# Patient Record
Sex: Male | Born: 1975 | Race: White | Hispanic: No | Marital: Single | Smoking: Former smoker
Health system: Southern US, Community
[De-identification: ages and names within clinical notes are randomized; demographics above are authoritative.]

## PROBLEM LIST (undated history)

## (undated) DIAGNOSIS — F32A Depression, unspecified: Secondary | ICD-10-CM

## (undated) DIAGNOSIS — F319 Bipolar disorder, unspecified: Secondary | ICD-10-CM

## (undated) DIAGNOSIS — F259 Schizoaffective disorder, unspecified: Secondary | ICD-10-CM

## (undated) HISTORY — PX: NO PAST SURGERIES: SHX2092

---

## 2019-11-04 ENCOUNTER — Other Ambulatory Visit: Payer: Self-pay

## 2019-11-04 ENCOUNTER — Ambulatory Visit (INDEPENDENT_AMBULATORY_CARE_PROVIDER_SITE_OTHER)
Admission: RE | Admit: 2019-11-04 | Discharge: 2019-11-04 | Disposition: A | Discharge status: Home / Self Care | Payer: Self-pay | Source: Ambulatory Visit

## 2019-11-04 DIAGNOSIS — L237 Allergic contact dermatitis due to plants, except food: Secondary | ICD-10-CM

## 2019-11-04 MED ORDER — PREDNISONE 10 MG (21) PO TBPK: ORAL_TABLET | Freq: Every day | ORAL | 0 refills | Status: DC

## 2019-11-04 NOTE — Discharge Instructions (Addendum)
Take steroid as prescribed: 6 tabs day 1, 5 tabs day 2, 4 tabs day 3, 3 tabs day 4, 2 tabs day 5, 1 tab day 6. May continue calamine lotion, aloe as needed for additional relief. Very important to wash all of your clothes, bedding, linens. Return for persistent or worsening swelling, or you develop change in vision, difficulty breathing or swallowing, chest pain, fever.

## 2019-11-04 NOTE — ED Provider Notes (Signed)
Virtual Visit via Video Note:  Sean Shepherd  initiated request for Telemedicine visit with [facility] Urgent Care team. I connected with Sean Shepherd  on 11/04/2019 at 2:36 PM  for a synchronized telemedicine visit using a video enabled HIPPA compliant telemedicine application. I verified that I am speaking with Sean Shepherd  using two identifiers. Sean Hall-Potvin, PA-C  was physically located in a [facility]gent care site and Sean Shepherd was located at a different location.   The limitations of evaluation and management by telemedicine as well as the availability of in-person appointments were discussed. Patient was informed that he  may incur a bill ( including co-pay) for this virtual visit encounter. Sean Shepherd  expressed understanding and gave verbal consent to proceed with virtual visit.     History of Present Illness:Sean Shepherd  is a 44 y.o. male presents with concern for wheezing reviewed dermatitis.  States that he was cutting vines from a tree Sunday afternoon.  Later realized it was poison ivy.  Had some itching over left leg, left arm, left side of face.  States that he has been using calamine lotion and aloe plant which provides a soothing relief, though no change with swelling.  Patient endorsing pruritus as well.  Seeking evaluation today given left periorbital edema.  States he is able to prop his eyes open: Denies visual changes, fever.  No difficulty breathing, swallowing, chest pain.    ROI as per HPI  History reviewed. No pertinent past medical history.  Not on File      Observations/Objective: 44 y.o male sitting in no acute distress.  Patient is able to speak in full sentences without coughing, sneezing, wheezing.  Patient does have significant left periorbital edema without overlying erythema.  Denies TTP.  Patient is able to open eyes, look around in all directions: No conjunctival injection.  Patient states vision is at  baseline.  Assessment and Plan: H&P consistent with poison ivy dermatitis.  Will start prednisone today patient follow-up with PCP in a few days for repeat evaluation.  Return precautions discussed, patient verbalized understanding and is agreeable to plan.  Follow Up Instructions: Patient to seek in-person evaluation for persistent, worsening symptoms including change in vision, difficulty breathing, swallowing, chest pain.   I discussed the assessment and treatment plan with the patient. The patient was provided an opportunity to ask questions and all were answered. The patient agreed with the plan and demonstrated an understanding of the instructions.   The patient was advised to call back or seek an in-person evaluation if the symptoms worsen or if the condition fails to improve as anticipated.  I provided 15 minutes of non-face-to-face time during this encounter.    Sean Hall-Potvin, PA-C  11/04/2019 2:36 PM         Shepherd, Sean, [place] 11/04/19 1437

## 2020-06-20 ENCOUNTER — Other Ambulatory Visit: Payer: Self-pay

## 2020-06-20 ENCOUNTER — Ambulatory Visit (INDEPENDENT_AMBULATORY_CARE_PROVIDER_SITE_OTHER): Payer: No Payment, Other | Admitting: Behavioral Health

## 2020-06-20 DIAGNOSIS — F2081 Schizophreniform disorder: Secondary | ICD-10-CM
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
## 2020-06-28 ENCOUNTER — Other Ambulatory Visit: Payer: Self-pay

## 2020-06-28 ENCOUNTER — Ambulatory Visit (HOSPITAL_COMMUNITY)
Admission: EM | Admit: 2020-06-28 | Discharge: 2020-06-29 | Disposition: A | Discharge status: Home / Self Care | Payer: No Payment, Other | Source: Ambulatory Visit | Attending: Psychiatry | Admitting: Psychiatry

## 2020-06-28 ENCOUNTER — Ambulatory Visit (INDEPENDENT_AMBULATORY_CARE_PROVIDER_SITE_OTHER): Payer: No Payment, Other | Admitting: Physician Assistant

## 2020-06-28 VITALS — BP 131/84 | HR 88 | Temp 98.0°F | Ht 72.0 in | Wt 148.0 lb

## 2020-06-28 DIAGNOSIS — F2081 Schizophreniform disorder: Secondary | ICD-10-CM

## 2020-06-28 DIAGNOSIS — Z20822 Contact with and (suspected) exposure to covid-19: Secondary | ICD-10-CM | POA: Diagnosis not present

## 2020-06-28 DIAGNOSIS — F259 Schizoaffective disorder, unspecified: Secondary | ICD-10-CM | POA: Insufficient documentation

## 2020-06-28 DIAGNOSIS — F250 Schizoaffective disorder, bipolar type: Secondary | ICD-10-CM

## 2020-06-28 DIAGNOSIS — F203 Undifferentiated schizophrenia: Secondary | ICD-10-CM | POA: Insufficient documentation

## 2020-06-28 LAB — COMPREHENSIVE METABOLIC PANEL
ALT: 14 U/L (ref 0–44)
AST: 16 U/L (ref 15–41)
Albumin: 4.7 g/dL (ref 3.5–5.0)
Alkaline Phosphatase: 64 U/L (ref 38–126)
Anion gap: 13 (ref 5–15)
BUN: 10 mg/dL (ref 6–20)
CO2: 28 mmol/L (ref 22–32)
Calcium: 10 mg/dL (ref 8.9–10.3)
Chloride: 100 mmol/L (ref 98–111)
Creatinine, Ser: 0.75 mg/dL (ref 0.61–1.24)
GFR, Estimated: >60 mL/min (ref >60)
Glucose, Bld: 74 mg/dL (ref 70–99)
Potassium: 4.3 mmol/L (ref 3.5–5.1)
Sodium: 141 mmol/L (ref 135–145)
Total Bilirubin: 3.2 mg/dL — ABNORMAL HIGH (ref 0.3–1.2)
Total Protein: 7.3 g/dL (ref 6.5–8.1)

## 2020-06-28 LAB — LIPID PANEL
Cholesterol: 197 mg/dL (ref 0–200)
HDL: 59 mg/dL (ref >40)
LDL Cholesterol: 128 mg/dL — ABNORMAL HIGH (ref 0–99)
Total CHOL/HDL Ratio: 3.3 RATIO
Triglycerides: 49 mg/dL (ref <150)
VLDL: 10 mg/dL (ref 0–40)

## 2020-06-28 LAB — CBC WITH DIFFERENTIAL/PLATELET
Abs Immature Granulocytes: 0.02 10*3/uL (ref 0.00–0.07)
Basophils Absolute: 0 10*3/uL (ref 0.0–0.1)
Basophils Relative: 0 %
Eosinophils Absolute: 0 10*3/uL (ref 0.0–0.5)
Eosinophils Relative: 0 %
HCT: 47.9 % (ref 39.0–52.0)
Hemoglobin: 15.7 g/dL (ref 13.0–17.0)
Immature Granulocytes: 0 %
Lymphocytes Relative: 16 %
Lymphs Abs: 1.5 10*3/uL (ref 0.7–4.0)
MCH: 29.8 pg (ref 26.0–34.0)
MCHC: 32.8 g/dL (ref 30.0–36.0)
MCV: 91.1 fL (ref 80.0–100.0)
Monocytes Absolute: 0.6 10*3/uL (ref 0.1–1.0)
Monocytes Relative: 6 %
Neutro Abs: 7 10*3/uL (ref 1.7–7.7)
Neutrophils Relative %: 78 %
Platelets: 258 10*3/uL (ref 150–400)
RBC: 5.26 MIL/uL (ref 4.22–5.81)
RDW: 11.9 % (ref 11.5–15.5)
WBC: 9.1 10*3/uL (ref 4.0–10.5)
nRBC: 0 % (ref 0.0–0.2)

## 2020-06-28 LAB — POC SARS CORONAVIRUS 2 AG: SARS Coronavirus 2 Ag: NEGATIVE

## 2020-06-28 LAB — POCT URINE DRUG SCREEN - MANUAL ENTRY (I-SCREEN)
POC Amphetamine UR: NOT DETECTED
POC Buprenorphine (BUP): NOT DETECTED
POC Cocaine UR: NOT DETECTED
POC Marijuana UR: POSITIVE — AB
POC Methadone UR: NOT DETECTED
POC Methamphetamine UR: NOT DETECTED
POC Morphine: NOT DETECTED
POC Oxazepam (BZO): NOT DETECTED
POC Oxycodone UR: NOT DETECTED
POC Secobarbital (BAR): NOT DETECTED

## 2020-06-28 LAB — TSH: TSH: 1.452 u[IU]/mL (ref 0.350–4.500)

## 2020-06-28 MED ORDER — ALUM & MAG HYDROXIDE-SIMETH 200-200-20 MG/5ML PO SUSP: 30.0000 mL | ORAL | Status: DC | PRN

## 2020-06-28 MED ORDER — ACETAMINOPHEN 325 MG PO TABS: 650.0000 mg | ORAL_TABLET | Freq: Four times a day (QID) | ORAL | Status: DC | PRN

## 2020-06-28 MED ORDER — RISPERIDONE 2 MG PO TBDP
2.0000 mg | ORAL_TABLET | Freq: Every day | ORAL | Status: DC
  Administered 2020-06-28: 2 mg via ORAL
  Filled 2020-06-28: qty 1
  Filled 2020-06-28: qty 7

## 2020-06-28 MED ORDER — TRAZODONE HCL 50 MG PO TABS: 50.0000 mg | ORAL_TABLET | Freq: Every evening | ORAL | Status: DC | PRN

## 2020-06-28 MED ORDER — MAGNESIUM HYDROXIDE 400 MG/5ML PO SUSP: 30.0000 mL | Freq: Every day | ORAL | Status: DC | PRN
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
## 2020-06-28 NOTE — ED Notes (Signed)
introduced self to pt. Pt is calm and cooperative w/o any c/o of pain or distress. Will continue to monitor pt for safety
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
## 2020-06-28 NOTE — ED Notes (Signed)
Pt sleeping@this time. Breathing even and unlabored. Will continue to monitor for safety 

## 2020-06-28 NOTE — ED Notes (Signed)
Patient belongings in locker #1

## 2020-06-28 NOTE — ED Notes (Signed)
44 yo male admitted to continuous assessment due to passive SI, at current denies SI/HI. Pt reports hallucinations telling him to hurt himself but pt states, "I wouldn't do it though". Calm, cooperative but talkative throughout assessment. Will monitor for safety.

## 2020-06-29 LAB — RESPIRATORY PANEL BY RT PCR (FLU A&B, COVID)
Influenza A by PCR: NEGATIVE
Influenza B by PCR: NEGATIVE
SARS Coronavirus 2 by RT PCR: NEGATIVE

## 2020-06-29 MED ORDER — RISPERIDONE 2 MG PO TBDP: 2.0000 mg | ORAL_TABLET | Freq: Every day | ORAL | 0 refills | Status: DC

## 2020-06-29 NOTE — Discharge Instructions (Signed)
[facility] during walk in hours for appointment with psychiatrist for further medication management. Please come for follow up appointment before your 7 day sample runs out so that you will not be without medication.  Walk in hours are 8-11 AM Monday through Thursday. There is often a wait, and it is best to arrive by 7:30 AM.   Address:  [address], in [place], [address] Ph: [phone]   

## 2020-06-29 NOTE — ED Notes (Signed)
Pt alert and oriented on the unit. Education, support, and encouragement provided. Discharge summary, medications and follow up appointments reviewed with pt. Suicide prevention resources provided. Pt's belongings in locker # 1 returned and belongings sheet signed. Pt denies SI/HI, pain, or any concerns at this time. Pt ambulatory on and off unit. Pt discharged to lobby.

## 2020-06-29 NOTE — ED Notes (Signed)
Meal given

## 2020-06-29 NOTE — ED Provider Notes (Signed)
FBC/OBS ASAP Discharge Summary  Date and Time: 06/29/2020 9:51 AM  Name: Sean Shepherd  MRN:  [id number]   Discharge Diagnoses:  Final diagnoses:  Schizoaffective disorder, bipolar type (HCC)    Subjective:  Patient interviewed in the observation area this morning. He is sitting in bed with mask on and is calm, cooperative and pleasant. He states that he has not noticed much difference with the medication; however, objectively TP is more organized, no delusional TC is volunteered or elicited, and speech is less rapid. He states that he slept "alright" although woke up 3-4 x throughout the night. Pt denies any AVH at time of interview but states that he did have them this AM but that "I am used to it". Denies SI/HI- "the thoughts of hurting are gone". Pt has coherent and linear conversation regarding the fact that he is aware he has delusions (no delusional TC reported today) and that this is somewhat upsetting since they were "delusions of grandeur". He denies SE/AE of medications and states that he would be amenable to taking them after leaving the hospital.    Andrey Campanile (mother) [phone] 9:57 AM called and notified mother of patients progress. She will pick him up around noon  Stay Summary: Pt presented to outpatient appointment but at his appointment there was concern for psychosis and it was recommend he present downstairs for criss evaluation. On initial interview, patient anxious bizarre, delusional although did display good insight into his delusion recognizing that they are not true. He also reported AVH, although did not appear to be RIS. His TP was tangential/circumstantial with rapid/pressured speech although redirectable. Pt was amenable to spend the night in observation in order to trial medication. Patient was started on risperdal 2 mg qhs and tolerated it well. Pt reported no subjective improvement in sx; however, objectively, TP was more organized and less tangential/cicumstantial.  On day of discharge he denied SI/HI and current AVH. He was amenable to continue medication outpatient.   Total Time spent with patient: 30 minutes  Past Psychiatric History: see H&P Past Medical History: No past medical history on file. No past surgical history on file. Family History: No family history on file. Family Psychiatric History: see H&P Social History:  Social History   Substance and Sexual Activity  Alcohol Use None     Social History   Substance and Sexual Activity  Drug Use Not on file    Social History   Socioeconomic History  . Marital status: Single    Spouse name: Not on file  . Number of children: Not on file  . Years of education: Not on file  . Highest education level: Not on file  Occupational History  . Not on file  Tobacco Use  . Smoking status: Not on file  Substance and Sexual Activity  . Alcohol use: Not on file  . Drug use: Not on file  . Sexual activity: Not on file  Other Topics Concern  . Not on file  Social History Narrative  . Not on file   Social Determinants of Health   Financial Resource Strain:   . Difficulty of Paying Living Expenses: Not on file  Food Insecurity:   . Worried About Programme researcher, broadcasting/film/video in the Last Year: Not on file  . Ran Out of Food in the Last Year: Not on file  Transportation Needs:   . Lack of Transportation (Medical): Not on file  . Lack of Transportation (Non-Medical): Not on file  Physical Activity:   .  Days of Exercise per Week: Not on file  . Minutes of Exercise per Session: Not on file  Stress:   . Feeling of Stress : Not on file  Social Connections:   . Frequency of Communication with Friends and Family: Not on file  . Frequency of Social Gatherings with Friends and Family: Not on file  . Attends Religious Services: Not on file  . Active Member of Clubs or Organizations: Not on file  . Attends Banker Meetings: Not on file  . Marital Status: Not on file   SDOH:  SDOH  Screenings   Alcohol Screen:   . Last Alcohol Screening Score (AUDIT): Not on file  Depression (PHQ2-9): Low Risk   . PHQ-2 Score: 1  Financial Resource Strain:   . Difficulty of Paying Living Expenses: Not on file  Food Insecurity:   . Worried About Programme researcher, broadcasting/film/video in the Last Year: Not on file  . Ran Out of Food in the Last Year: Not on file  Housing:   . Last Housing Risk Score: Not on file  Physical Activity:   . Days of Exercise per Week: Not on file  . Minutes of Exercise per Session: Not on file  Social Connections:   . Frequency of Communication with Friends and Family: Not on file  . Frequency of Social Gatherings with Friends and Family: Not on file  . Attends Religious Services: Not on file  . Active Member of Clubs or Organizations: Not on file  . Attends Banker Meetings: Not on file  . Marital Status: Not on file  Stress:   . Feeling of Stress : Not on file  Tobacco Use:   . Smoking Tobacco Use: Not on file  . Smokeless Tobacco Use: Not on file  Transportation Needs:   . Lack of Transportation (Medical): Not on file  . Lack of Transportation (Non-Medical): Not on file    Has this patient used any form of tobacco in the last 30 days? (Cigarettes, Smokeless Tobacco, Cigars, and/or Pipes) Prescription not provided because: patient does not use tobacco  Current Medications:  Current Facility-Administered Medications  Medication Dose Route Frequency Provider Last Rate Last Admin  . acetaminophen (TYLENOL) tablet 650 mg  650 mg Oral Q6H PRN Estella Husk, MD      . alum & mag hydroxide-simeth (MAALOX/MYLANTA) 200-200-20 MG/5ML suspension 30 mL  30 mL Oral Q4H PRN Estella Husk, MD      . magnesium hydroxide (MILK OF MAGNESIA) suspension 30 mL  30 mL Oral Daily PRN Estella Husk, MD      . risperiDONE (RISPERDAL M-TABS) disintegrating tablet 2 mg  2 mg Oral QHS Estella Husk, MD   2 mg at 06/28/20 2105  . traZODone  (DESYREL) tablet 50 mg  50 mg Oral QHS PRN Estella Husk, MD       No current outpatient medications on file.    PTA Medications: (Not in a hospital admission)   Musculoskeletal  Strength & Muscle Tone: within normal limits Gait & Station: normal Patient leans: N/A  Psychiatric Specialty Exam  Presentation  General Appearance: Appropriate for Environment;Casual  Eye Contact:Good  Speech:Clear and Coherent;Normal Rate  Speech Volume:Normal  Handedness:Right   Mood and Affect  Mood:Euthymic  Affect:Appropriate;Constricted   Thought Process  Thought Processes:Linear (less circumstantial/tangential than yesterday)  Descriptions of Associations:Intact  Orientation:Full (Time, Place and Person)  Thought Content:WDL;Other (comment) (No delusional TC elicited or volunteered on exam)  Hallucinations:Hallucinations: None (denies current AVH) Description of Auditory Hallucinations: denied Description of Visual Hallucinations: denied  Ideas of Reference:None  Suicidal Thoughts:Suicidal Thoughts: No  Homicidal Thoughts:Homicidal Thoughts: No   Sensorium  Memory:Immediate Good;Recent Good;Remote Good  Judgment:Fair  Insight:Good   Executive Functions  Concentration:Fair  Attention Span:Good  Recall:Good  Fund of Knowledge:Good  Language:Good   Psychomotor Activity  Psychomotor Activity:Psychomotor Activity: Normal   Assets  Assets:Communication Skills;Desire for Improvement;Physical Health;Resilience;Social Support   Sleep  Sleep:Sleep: Fair   Physical Exam  Physical Exam Constitutional:      Appearance: Normal appearance. He is normal weight.  HENT:     Head: Normocephalic.  Eyes:     Extraocular Movements: Extraocular movements intact.  Pulmonary:     Effort: Pulmonary effort is normal.  Musculoskeletal:     Cervical back: Normal range of motion.  Neurological:     Mental Status: He is alert.    Review of Systems   Constitutional: Negative for chills and fever.  HENT: Negative for congestion and ear pain.   Gastrointestinal: Negative for abdominal pain, nausea and vomiting.  Musculoskeletal: Negative for myalgias.  Neurological: Negative for headaches.   Blood pressure 126/72, pulse 80, temperature 97.7 F (36.5 C), temperature source Oral, resp. rate 18, height 6\' 1"  (1.854 m), weight 67.1 kg, SpO2 99 %. Body mass index is 19.53 kg/m.  Demographic Factors:  Male, Caucasian and Living alone  Loss Factors: Decrease in vocational status (has not worked since 2016)  Historical Factors: Family history of suicide, Family history of mental illness or substance abuse and Impulsivity  Risk Reduction Factors:   Sense of responsibility to family, Positive social support and Positive coping skills or problem solving skills  Continued Clinical Symptoms:  Patient denied AVH at time of discharge   Cognitive Features That Contribute To Risk:  None    Suicide Risk:  Minimal: No identifiable suicidal ideation.  Patients presenting with no risk factors but with morbid ruminations; may be classified as minimal risk based on the severity of the depressive symptoms  Plan Of Care/Follow-up recommendations:  Activity:  as tolerated Diet:  regular Other:     Patient is instructed prior to discharge to: Take all medications as prescribed by his/her mental healthcare provider. Report any adverse effects and or reactions from the medicines to his/her outpatient provider promptly. Patient has been instructed & cautioned: To not engage in alcohol and or illegal drug use while on prescription medicines. In the event of worsening symptoms, patient is instructed to call the crisis hotline, 911 and or go to the nearest ED for appropriate evaluation and treatment of symptoms. To follow-up with his/her primary care provider for your other medical issues, concerns and or health care needs.     Disposition:discharge  into care of parents  2017, MD 06/29/2020, 9:51 AM

## 2020-06-29 NOTE — ED Notes (Signed)
Pt sleeping@this  time. breathing and unlabored. Will continue to monitor for safety

## 2020-06-29 NOTE — ED Notes (Signed)
Pt alert, oriented and sitting up in his chair/bed. Pt is ambulatory on unit with no concerns at this time. Education, support, and encouragement provided. Pt verbally contracts for safety and remains safe on the unit.

## 2020-06-30 LAB — HEMOGLOBIN A1C
Hgb A1c MFr Bld: 5.7 % — ABNORMAL HIGH (ref 4.8–5.6)
Mean Plasma Glucose: 117 mg/dL

## 2020-07-05 ENCOUNTER — Ambulatory Visit (INDEPENDENT_AMBULATORY_CARE_PROVIDER_SITE_OTHER): Payer: No Payment, Other | Admitting: Physician Assistant

## 2020-07-05 ENCOUNTER — Telehealth (HOSPITAL_COMMUNITY): Payer: Self-pay | Admitting: *Deleted

## 2020-07-05 ENCOUNTER — Encounter (HOSPITAL_COMMUNITY): Payer: Self-pay | Admitting: Physician Assistant

## 2020-07-05 ENCOUNTER — Other Ambulatory Visit: Payer: Self-pay

## 2020-07-05 ENCOUNTER — Other Ambulatory Visit (HOSPITAL_COMMUNITY): Payer: Self-pay | Admitting: Physician Assistant

## 2020-07-05 VITALS — BP 117/73 | HR 70 | Ht 73.0 in

## 2020-07-05 DIAGNOSIS — F250 Schizoaffective disorder, bipolar type: Secondary | ICD-10-CM

## 2020-07-05 MED ORDER — RISPERIDONE 2 MG PO TABS: 2.0000 mg | ORAL_TABLET | Freq: Every day | ORAL | 1 refills | Status: DC

## 2020-07-05 MED FILL — risperiDONE 2 MG TABS: 2 | 30 days supply | Qty: 30 | Fill #0

## 2020-07-05 NOTE — Progress Notes (Signed)
Psychiatric Initial Adult Assessment   Patient Identification: Sean Shepherd MRN:  [id number] Date of Evaluation:  07/05/2020 Referral Source: N/A Chief Complaint:   Chief Complaint    Medication Management     Visit Diagnosis:    ICD-10-CM   1. Schizoaffective disorder, bipolar type (HCC)  F25.0 risperiDONE (RISPERDAL) 2 MG tablet    History of Present Illness:  Sean Shepherd is a 44 year old, male with a past psychiatric history significant for auditory hallucinations who presents to [facility] for medication management. Patient was last assessed at [facility] for worsening auditory hallucinations on October 27th, 2021. During this encounter, patient was not able to contract for safety and was deemed a potential danger to himself. As a result, patient was admitted to [facility] for stabilization and management for his worsening auditory hallucinations.  Today patient states he feels more calm since the last encounter. Patient states that he was admitted to [facility] on October 27th 2021 (Wednesday) and discharged the following day. During his admission, patient was diagnosed with Schizoaffective disorder bipolar type and discharged with a 7-day prescription of Risperdal. Patient is interested in a new prescription of Risperdal and reports improvement in symptoms while taking the medication during his 24 hr stay in [facility].  Before taking Risperdal, patient states that he had issues with sleep and would stay up for hours on end. When patient was finally able to go to bed, he would often sleep on his couch only to end of up waking hours later. He endorses that his sleep has improved while being on Risperdal and he has even managed to sleep in his bed. Patient states that he feels more level now, however, his auditory hallucinatiosn are still present. Patient states that he still gets anxious at times especially when thinking about  managing this new diagnosis. Patient's main concern today is getting a refill on his Risperdal and continuing his future follow up appointments.  Patient denies active suicide ideation, however, patient reports that he will occasionally have festering thoughts, "here and there," of harming himself. He states that whenever these thoughts occur, he engages in grounding techniques that involve applying minimal amounts of pain and pressure at specific points on the body that keep him in the present. Patient denies homicide ideation. Patient reports auditory hallucinations but states they have been more manageable since being placed on Risperdal. Patient states he receives 5 - 6 hours of sleep and feels mostly well rested upon waking. Patient reports appetite and often has 6 smaller meals a day. 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 Patient admits to using marijuana in the past for self-medications with his most recent use occurring 18 hours ago. Patient has recently started using hemp products for management of symptoms.  Associated Signs/Symptoms: Depression Symptoms:  psychomotor agitation, anxiety, (Hypo) Manic Symptoms:  Distractibility, Flight of Ideas, Grandiosity, Hallucinations, Anxiety Symptoms:  Excessive Worry, Panic Symptoms, Psychotic Symptoms:  Hallucinations: Auditory PTSD Symptoms: Re-experiencing:  Flashbacks Intrusive Thoughts  Past Psychiatric History: Schizoaffective disorder/bipolar type  Previous Psychotropic Medications: No   Substance Abuse History in the last 12 months:  Yes.    Consequences of Substance Abuse: Negative  Past Medical History: No past medical history on file. No past surgical history on file.  Family Psychiatric History: Father - Depression, currently taking Zoloft Sister - unsure, hx of trouble with the law, unsure of med taken  Family History:  No family history on  file.  Social History:   Social History   Socioeconomic History  . Marital status: Single    Spouse name: Not on file  . Number of children: Not on file  . Years of education: Not on file  . Highest education level: Not on file  Occupational History  . Not on file  Tobacco Use  . Smoking status: Not on file  Substance and Sexual Activity  . Alcohol use: Not on file  . Drug use: Not on file  . Sexual activity: Not on file  Other Topics Concern  . Not on file  Social History Narrative  . Not on file   Social Determinants of Health   Financial Resource Strain:   . Difficulty of Paying Living Expenses: Not on file  Food Insecurity:   . Worried About Programme researcher, broadcasting/film/video in the Last Year: Not on file  . Ran Out of Food in the Last Year: Not on file  Transportation Needs:   . Lack of Transportation (Medical): Not on file  . Lack of Transportation (Non-Medical): Not on file  Physical Activity:   . Days of Exercise per Week: Not on file  . Minutes of Exercise per Session: Not on file  Stress:   . Feeling of Stress : Not on file  Social Connections:   . Frequency of Communication with Friends and Family: Not on file  . Frequency of Social Gatherings with Friends and Family: Not on file  . Attends Religious Services: Not on file  . Active Member of Clubs or Organizations: Not on file  . Attends Banker Meetings: Not on file  . Marital Status: Not on file    Additional Social History: Patient receives therapy from [facility] and attends [employer].  Patient is eventually looking to seek employment.  Allergies:  No Known Allergies  Metabolic Disorder Labs: Lab Results  Component Value Date   HGBA1C 5.7 (H) 06/28/2020   MPG 117 06/28/2020   No results found for: PROLACTIN Lab Results  Component Value Date   CHOL 197 06/28/2020   TRIG 49 06/28/2020   HDL 59 06/28/2020   CHOLHDL 3.3 06/28/2020   VLDL 10 06/28/2020   LDLCALC 128 (H)  06/28/2020   Lab Results  Component Value Date   TSH 1.452 06/28/2020    Therapeutic Level Labs: No results found for: LITHIUM No results found for: CBMZ No results found for: VALPROATE  Current Medications: Current Outpatient Medications  Medication Sig Dispense Refill  . risperiDONE (RISPERDAL) 2 MG tablet Take 1 tablet (2 mg total) by mouth at bedtime. 30 tablet 1   No current facility-administered medications for this visit.    Musculoskeletal: Strength & Muscle Tone: within normal limits Gait & Station: normal Patient leans: N/A  Psychiatric Specialty Exam: Review of Systems  Psychiatric/Behavioral: Positive for agitation and hallucinations. Negative for self-injury, sleep disturbance and suicidal ideas. The patient is nervous/anxious and is hyperactive.     Blood pressure 117/73, pulse 70, height 6\' 1"  (1.854 m), SpO2 100 %.Body mass index is 19.53 kg/m.  General Appearance: Well Groomed  Eye Contact:  Good  Speech:  Clear and Coherent and Normal Rate  Volume:  Normal  Mood:  Anxious and Pleasant  Affect:  Appropriate  Thought Process:  Coherent and Goal Directed  Orientation:  Full (Time, Place, and Person)  Thought Content:  Logical and Circumstantial  Suicidal Thoughts:  No  Homicidal Thoughts:  No  Memory:  Immediate;   Good Recent;   Good Remote;   Good  Judgement:  Good  Insight:  Good  Psychomotor Activity:  Normal and Restlessness  Concentration:  Concentration: Good and Attention Span: Good  Recall:  Good  Fund of Knowledge:Good  Language: Good  Akathisia:  No  Handed:  Right  AIMS (if indicated):  not done  Assets:  Communication Skills Desire for Improvement Housing Physical Health Resilience Social Support  ADL's:  Intact  Cognition: WNL  Sleep:  Good   Screenings: PHQ2-9     ED from 06/28/2020 in [facility]  PHQ-2 Total Score 1      Assessment and Plan:  Sean Shepherd is a 44 year old,  male with a past psychiatric history significant for auditory hallucinations who presents to [facility] for medication management. Patient was last assessed at [facility] for worsening auditory hallucinations on October 27th, 2021. During the encounter, patient was not able to contract for safety and was deemed a potential danger to himself. As a result, patient was admitted to [facility] for stabilization and management for his worsening auditory hallucinations. Today, patient states that he feels more calm than the previous encounter. Although patient reports that he is doing better, he endorses that he still experiences auditory hallucinations. Patient reports improvement of symptoms while on Risperdal and is requesting a refill on this medication. Patient has no other concerns at this time.  1. Schizoaffective disorder, bipolar type (HCC)   - risperiDONE (RISPERDAL) 2 MG tablet; Take 1 tablet (2 mg total) by mouth at bedtime.  Dispense: 30 tablet; Refill: 1  Patient to follow up in 4 weeks.  Meta Hatchet, PA 11/3/202110:25 PM

## 2020-07-05 NOTE — Telephone Encounter (Signed)
Call from [facility] asking if patients rx for Risperidone disinegrating tabs can be changed to oral tabs as the M-Tabs are too expensive and they will not order them. Contacted [facility] Dr Bronwen Betters and she was delayed getting back to me on this concern and when I called pharm back with the answer that yes it could be changed to oral tabs he had left the pharm with his Rx because he said he has an appt here today and will ask the provider about it when he is seen.

## 2020-07-16 ENCOUNTER — Encounter (HOSPITAL_COMMUNITY): Payer: Self-pay | Admitting: Physician Assistant

## 2020-08-02 MED FILL — risperiDONE 2 MG TABS: 2 | 30 days supply | Qty: 30 | Fill #1

## 2020-08-08 ENCOUNTER — Ambulatory Visit (INDEPENDENT_AMBULATORY_CARE_PROVIDER_SITE_OTHER): Payer: No Payment, Other | Admitting: Physician Assistant

## 2020-08-08 ENCOUNTER — Encounter (HOSPITAL_COMMUNITY): Payer: Self-pay | Admitting: Physician Assistant

## 2020-08-08 ENCOUNTER — Other Ambulatory Visit (HOSPITAL_COMMUNITY): Payer: Self-pay | Admitting: Physician Assistant

## 2020-08-08 ENCOUNTER — Other Ambulatory Visit: Payer: Self-pay

## 2020-08-08 VITALS — BP 122/68 | HR 58 | Ht 73.0 in | Wt 164.0 lb

## 2020-08-08 DIAGNOSIS — F250 Schizoaffective disorder, bipolar type: Secondary | ICD-10-CM | POA: Diagnosis not present

## 2020-08-08 MED ORDER — RISPERIDONE 2 MG PO TABS: 2.0000 mg | ORAL_TABLET | Freq: Every day | ORAL | 1 refills | Status: DC

## 2020-08-08 MED ORDER — RISPERIDONE 1 MG PO TABS: 1.0000 mg | ORAL_TABLET | Freq: Every day | ORAL | 1 refills | Status: DC

## 2020-08-08 NOTE — Progress Notes (Signed)
BH MD/PA/NP OP Progress Note  08/08/2020 2:26 PM Sean Shepherd  MRN:  [id number]  Chief Complaint:  Chief Complaint    Follow-up; Medication Management     HPI:  Sean Shepherd is a 44 year old, male with a past psychiatric history significant for auditory hallucinations who presents to [facility] for follow-up and medication management.  Patient is currently being managed on the following medication:  Risperdal 2 mg by mouth at bedtime  Patient reports that he is still "battling voices" from the time he wakes up to the time he goes to sleep.  Although patient endorses auditory hallucinations, he reports feeling more stable and having more consistent sleep.  Patient expresses that he still is affected by sounds that are translated into auditory hallucinations.  Patient states that his hallucinations are vivid and constantly occurring. He states that his mornings are usually hellacious because his hallucinations are most overwhelming in the morning.  Patient does express that he sometimes forgets to take his medications.  Patient also expresses experiencing depressive symptoms.  Patient states that his symptoms include feeling hopeless, lack of motivation, and no source of pleasure.  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  Patient also attributes feeling more depressed due to changes in the season.  Patient states that in the past he has used cannabis to help alleviate his depression, however, he does note an incident where cannabis sent him into mania.  Patient has been placed on Lexapro in the past for the management of depression, however, the Lexapro made him feel flat.  Patient states that the only thing that has been able to help with his depressive symptoms is CBD oil from the a hemp store.  Patient also believes that CBD oil may be  contributing to his improved sleep.  Patient is pleasant, calm, cooperative, and fully engaged during the entirety of the encounter.  Patient reports depressed mood and feelings of anxiety.  He attributes these feelings to not knowing where to go, having no direction, and recently filing for unemployment.  Patient expresses feeling ashamed about having to file for unemployment.  Patient denies current suicidal or homicidal ideations.  He states he has no plan to harm himself and states that he would not want to create problems for his family by committing suicide.  Patient endorses constant auditory and visual hallucinations.  Patient states that he receives on average 10 hours of sleep and feels well rested upon waking.  Patient has fair appetite and states he eats only when he is hungry.  Patient denies alcohol or tobacco use.  Patient states that he does use CBD oil and delta8 THC.   Visit Diagnosis:    ICD-10-CM   1. Schizoaffective disorder, bipolar type (HCC)  F25.0 risperiDONE (RISPERDAL) 1 MG tablet    risperiDONE (RISPERDAL) 2 MG tablet    Past Psychiatric History: Schizoaffective disorder/bipolar type  Past Medical History: No past medical history on file. No past surgical history on file.  Family Psychiatric History:  Father - Depression, currently taking Zoloft Sister - unsure, hx of trouble with the law, unsure of med taken  Family History: No family history on file.  Social History:  Social History   Socioeconomic History  . Marital status: Single    Spouse name: Not on file  . Number of children: Not on file  . Years of education: Not on file  .  Highest education level: Not on file  Occupational History  . Not on file  Tobacco Use  . Smoking status: Unknown If Ever Smoked  . Smokeless tobacco: Never Used  Substance and Sexual Activity  . Alcohol use: Not Currently  . Drug use: Yes    Types: Marijuana  . Sexual activity: Not on file  Other Topics Concern  . Not  on file  Social History Narrative  . Not on file   Social Determinants of Health   Financial Resource Strain:   . Difficulty of Paying Living Expenses: Not on file  Food Insecurity:   . Worried About Programme researcher, broadcasting/film/video in the Last Year: Not on file  . Ran Out of Food in the Last Year: Not on file  Transportation Needs:   . Lack of Transportation (Medical): Not on file  . Lack of Transportation (Non-Medical): Not on file  Physical Activity:   . Days of Exercise per Week: Not on file  . Minutes of Exercise per Session: Not on file  Stress:   . Feeling of Stress : Not on file  Social Connections:   . Frequency of Communication with Friends and Family: Not on file  . Frequency of Social Gatherings with Friends and Family: Not on file  . Attends Religious Services: Not on file  . Active Member of Clubs or Organizations: Not on file  . Attends Banker Meetings: Not on file  . Marital Status: Not on file    Allergies: No Known Allergies  Metabolic Disorder Labs: Lab Results  Component Value Date   HGBA1C 5.7 (H) 06/28/2020   MPG 117 06/28/2020   No results found for: PROLACTIN Lab Results  Component Value Date   CHOL 197 06/28/2020   TRIG 49 06/28/2020   HDL 59 06/28/2020   CHOLHDL 3.3 06/28/2020   VLDL 10 06/28/2020   LDLCALC 128 (H) 06/28/2020   Lab Results  Component Value Date   TSH 1.452 06/28/2020    Therapeutic Level Labs: No results found for: LITHIUM No results found for: VALPROATE No components found for:  CBMZ  Current Medications: Current Outpatient Medications  Medication Sig Dispense Refill  . risperiDONE (RISPERDAL) 1 MG tablet Take 1 tablet (1 mg total) by mouth daily. 30 tablet 1  . risperiDONE (RISPERDAL) 2 MG tablet Take 1 tablet (2 mg total) by mouth at bedtime. 30 tablet 1   No current facility-administered medications for this visit.     Musculoskeletal: Strength & Muscle Tone: within normal limits Gait & Station:  normal Patient leans: N/A  Psychiatric Specialty Exam: Review of Systems  Psychiatric/Behavioral: Positive for hallucinations and suicidal ideas. Negative for decreased concentration. Self-injury: Patient participates in "grounding techniques" where he hurt himself by pinching and slamming himself into things. The patient is nervous/anxious.     Blood pressure 122/68, pulse (!) 58, height 6\' 1"  (1.854 m), weight 164 lb (74.4 kg), SpO2 97 %.Body mass index is 21.64 kg/m.  General Appearance: Fairly Groomed and Neat  Eye Contact:  Good  Speech:  Clear and Coherent and Normal Rate  Volume:  Normal  Mood:  Euthymic  Affect:  Appropriate and Congruent  Thought Process:  Coherent, Goal Directed and Descriptions of Associations: Intact  Orientation:  Full (Time, Place, and Person)  Thought Content: WDL and Logical   Suicidal Thoughts:  Yes.  without intent/plan  Homicidal Thoughts:  No  Memory:  Immediate;   Good Recent;   Good Remote;   Good  Judgement:  Good  Insight:  Fair  Psychomotor Activity:  Normal  Concentration:  Concentration: Good and Attention Span: Good  Recall:  Good  Fund of Knowledge: Good  Language: Good  Akathisia:  NA  Handed:  Right  AIMS (if indicated): not done  Assets:  Communication Skills Desire for Improvement Housing Physical Health Resilience Social Support  ADL's:  Intact  Cognition: WNL  Sleep:  Good   Screenings: PHQ2-9     ED from 06/28/2020 in [facility]  PHQ-2 Total Score 1       Assessment and Plan:   Sean Shepherd is a 44 year old, male with a past psychiatric history significant for auditory hallucinations who presents to [facility] for follow-up and medication management.  Patient is currently being managed on Risperidone 2 mg at bedtime.  Patient reports that he is still struggling with auditory hallucinations that manifest as sounds being converted  into voices.  He states that the auditory hallucinations are worse in the morning.  In addition to auditory hallucinations, patient also experiences some visual hallucinations but not to the same degree as his auditory hallucinations.  Due to his auditory hallucinations being worse in the morning, patient was recommended to take 1 mg of risperidone in the morning in addition to risperidone 2 mg at night to help manage his hallucinations.  Patient was agreeable to recommendation.  Patient also expresses depressive symptoms some of which include hopelessness, lack of motivation, and no source of pleasure.  Patient attributes his depression to nothing to focus on, changes in the season, and filing for unemployment.  Patient has been on Lexapro in the past for depression but it was not very helpful.  Patient states that his father is currently taking Zoloft for the management of his depression.  Patient is unsure if his father finds the medication helpful.  Patient was agreeable to holding out on selecting an antidepressant until the next encounter.  Patient will be reassessed on his follow-up appointment and writer will determine if patient needs to be placed on an antidepressant.  1. Schizoaffective disorder, bipolar type (HCC)  - risperiDONE (RISPERDAL) 1 MG tablet; Take 1 tablet (1 mg total) by mouth daily.  Dispense: 30 tablet; Refill: 1 - risperiDONE (RISPERDAL) 2 MG tablet; Take 1 tablet (2 mg total) by mouth at bedtime.  Dispense: 30 tablet; Refill: 1  Patient to follow-up in 6 weeks   Meta Hatchet, PA 08/08/2020, 2:26 PM

## 2020-08-09 MED FILL — risperiDONE 1 MG TABS: 1 | 30 days supply | Qty: 30 | Fill #0

## 2020-09-04 MED FILL — risperiDONE 1 MG TABS: 1 | 30 days supply | Qty: 30 | Fill #1

## 2020-09-04 MED FILL — risperiDONE 2 MG TABS: 2 | 30 days supply | Qty: 30 | Fill #0

## 2020-09-12 NOTE — Progress Notes (Incomplete)
BH MD/PA/NP OP Progress Note  08/08/2020 2:26 PM Sean Shepherd  MRN:  [id number]  Chief Complaint:  Chief Complaint    Follow-up; Medication Management     HPI:  Sean Shepherd is a 45 year old, male with a past psychiatric history significant for auditory hallucinations who presents to [facility] for follow-up and medication management.  Patient is currently being managed on the following medication:  Risperdal 2 mg by mouth at bedtime  Patient reports that he is still "battling voices" from the time he wakes up to the time he goes to sleep.  Although patient endorses auditory hallucinations, he reports feeling more stable and having more consistent sleep.  Patient expresses that he still is affected by sounds that are translated into auditory hallucinations.  Patient states that his hallucinations are vivid and constantly at time.  States that his mornings are usually hellacious because his hallucinations are worse in the morning.  Patient does express that he sometimes forgets to take his medications.  Patient also expresses experiencing depressive symptoms.  Patient states that his symptoms include feeling hopeless, lack of motivation, and no source of pleasure.  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  Patient also attributes feeling more depressed to changes in season.  Patient states that in the past he has used cannabis to help alleviate his depression, however, he does note an incident where cannabis sent him into mania.  Patient has been placed on Lexapro in the past for the management of depression, however, made him feel flat.  Patient states that the only thing of been able to help with his depressive symptoms is CBD oil from the a hemp store.  Patient also believes that CBD oil may be contributing to his improved sleep.  Patient  is pleasant, calm, cooperative, and fully engaged during the entirety of the encounter.  Patient reports depressed mood and feelings of anxiety.  He attributes these feelings to not knowing where to go, having no direction, and recently filing for unemployment.  Patient expresses feeling ashamed about having to file for unemployment.  Patient denied current suicidal or homicidal ideations.  He states he has no plan to harm himself and states that he would not want to create problems for his family by committing suicide.  Patient endorses constant auditory and visual hallucinations.  Patient states that he receives on average 10 hours of sleep and feels well rested upon waking.  Patient has fair appetite and states he eats only when he is hungry.  Patient denies alcohol or tobacco use.  Patient states that he does use CBD oil and delta8 THC.   Visit Diagnosis:    ICD-10-CM   1. Schizoaffective disorder, bipolar type (HCC)  F25.0 risperiDONE (RISPERDAL) 1 MG tablet    risperiDONE (RISPERDAL) 2 MG tablet    Past Psychiatric History: Schizoaffective disorder/bipolar type  Past Medical History: No past medical history on file. No past surgical history on file.  Family Psychiatric History:  Father - Depression, currently taking Zoloft Sister - unsure, hx of trouble with the law, unsure of med taken  Family History: No family history on file.  Social History:  Social History   Socioeconomic History  . Marital status: Single    Spouse name: Not on file  . Number of children: Not on file  . Years of education: Not on file  . Highest education level:  Not on file  Occupational History  . Not on file  Tobacco Use  . Smoking status: Unknown If Ever Smoked  . Smokeless tobacco: Never Used  Substance and Sexual Activity  . Alcohol use: Not Currently  . Drug use: Yes    Types: Marijuana  . Sexual activity: Not on file  Other Topics Concern  . Not on file  Social History Narrative  . Not on  file   Social Determinants of Health   Financial Resource Strain:   . Difficulty of Paying Living Expenses: Not on file  Food Insecurity:   . Worried About Programme researcher, broadcasting/film/video in the Last Year: Not on file  . Ran Out of Food in the Last Year: Not on file  Transportation Needs:   . Lack of Transportation (Medical): Not on file  . Lack of Transportation (Non-Medical): Not on file  Physical Activity:   . Days of Exercise per Week: Not on file  . Minutes of Exercise per Session: Not on file  Stress:   . Feeling of Stress : Not on file  Social Connections:   . Frequency of Communication with Friends and Family: Not on file  . Frequency of Social Gatherings with Friends and Family: Not on file  . Attends Religious Services: Not on file  . Active Member of Clubs or Organizations: Not on file  . Attends Banker Meetings: Not on file  . Marital Status: Not on file    Allergies: No Known Allergies  Metabolic Disorder Labs: Lab Results  Component Value Date   HGBA1C 5.7 (H) 06/28/2020   MPG 117 06/28/2020   No results found for: PROLACTIN Lab Results  Component Value Date   CHOL 197 06/28/2020   TRIG 49 06/28/2020   HDL 59 06/28/2020   CHOLHDL 3.3 06/28/2020   VLDL 10 06/28/2020   LDLCALC 128 (H) 06/28/2020   Lab Results  Component Value Date   TSH 1.452 06/28/2020    Therapeutic Level Labs: No results found for: LITHIUM No results found for: VALPROATE No components found for:  CBMZ  Current Medications: Current Outpatient Medications  Medication Sig Dispense Refill  . risperiDONE (RISPERDAL) 1 MG tablet Take 1 tablet (1 mg total) by mouth daily. 30 tablet 1  . risperiDONE (RISPERDAL) 2 MG tablet Take 1 tablet (2 mg total) by mouth at bedtime. 30 tablet 1   No current facility-administered medications for this visit.     Musculoskeletal: Strength & Muscle Tone: within normal limits Gait & Station: normal Patient leans: N/A  Psychiatric Specialty  Exam: Review of Systems  Psychiatric/Behavioral: Positive for hallucinations and suicidal ideas. Negative for decreased concentration. Self-injury: Patient participates in "grounding techniques" where he hurt himself by pinching and slamming himself into things. The patient is nervous/anxious.     Blood pressure 122/68, pulse (!) 58, height 6\' 1"  (1.854 m), weight 164 lb (74.4 kg), SpO2 97 %.Body mass index is 21.64 kg/m.  General Appearance: Fairly Groomed and Neat  Eye Contact:  Good  Speech:  Clear and Coherent and Normal Rate  Volume:  Normal  Mood:  Euthymic  Affect:  Appropriate and Congruent  Thought Process:  Coherent, Goal Directed and Descriptions of Associations: Intact  Orientation:  Full (Time, Place, and Person)  Thought Content: WDL and Logical   Suicidal Thoughts:  Yes.  without intent/plan  Homicidal Thoughts:  No  Memory:  Immediate;   Good Recent;   Good Remote;   Good  Judgement:  Good  Insight:  Fair  Psychomotor Activity:  Normal  Concentration:  Concentration: Good and Attention Span: Good  Recall:  Good  Fund of Knowledge: Good  Language: Good  Akathisia:  NA  Handed:  Right  AIMS (if indicated): not done  Assets:  Communication Skills Desire for Improvement Housing Physical Health Resilience Social Support  ADL's:  Intact  Cognition: WNL  Sleep:  Good   Screenings: PHQ2-9     ED from 06/28/2020 in [facility]  PHQ-2 Total Score 1       Assessment and Plan:   Sean Shepherd is a 45 year old, male with a past psychiatric history significant for auditory hallucinations who presents to [facility] for follow-up and medication management.  Patient is currently being managed on Risperidone 2 mg at bedtime.  Patient reports that he is still struggling with auditory hallucinations that manifest as sounds being converted into voices.  He states that the auditory  hallucinations are worse in the morning.  In addition to auditory hallucinations, patient also experiences some visual hallucinations but not to the same degree as his auditory hallucinations.  Due to auditory hallucinations being worse in the morning for the patient, patient was recommended to take 1 mg of risperidone in the morning in addition to risperidone 2 mg at night to help manage his hallucinations.  Patient was agreeable to recommendation.  Patient also expresses depressive symptoms some of which include hopelessness, lack of motivation, and no source of pleasure.  Patient attributes his depression to nothing to focus on, changes in to see them, and filing for unemployment.  Patient has been on Lexapro in the past for depression but it was not very helpful.  Patient states that his father is currently taking Zoloft for the management of his depression.  Patient is unsure if his father finds the medication helpful.  Patient was agreeable to holding out on selecting an antidepressant until the next encounter.  Patient will be reassessed on his follow-up appointment and writer will determine if patient needs to be placed on an antidepressant.  1. Schizoaffective disorder, bipolar type (HCC) *** - risperiDONE (RISPERDAL) 1 MG tablet; Take 1 tablet (1 mg total) by mouth daily.  Dispense: 30 tablet; Refill: 1 - risperiDONE (RISPERDAL) 2 MG tablet; Take 1 tablet (2 mg total) by mouth at bedtime.  Dispense: 30 tablet; Refill: 1  Patient to follow-up in 6 weeks   Meta Hatchet, PA 08/08/2020, 2:26 PM

## 2020-09-13 ENCOUNTER — Other Ambulatory Visit: Payer: Self-pay

## 2020-09-13 ENCOUNTER — Other Ambulatory Visit (HOSPITAL_COMMUNITY): Payer: Self-pay | Admitting: Physician Assistant

## 2020-09-13 ENCOUNTER — Encounter (HOSPITAL_COMMUNITY): Payer: Self-pay | Admitting: Physician Assistant

## 2020-09-13 ENCOUNTER — Ambulatory Visit (INDEPENDENT_AMBULATORY_CARE_PROVIDER_SITE_OTHER): Payer: No Payment, Other | Admitting: Physician Assistant

## 2020-09-13 DIAGNOSIS — F250 Schizoaffective disorder, bipolar type: Secondary | ICD-10-CM | POA: Diagnosis not present

## 2020-09-13 MED ORDER — LAMOTRIGINE 25 MG PO TABS: 50.0000 mg | ORAL_TABLET | Freq: Every day | ORAL | 0 refills | Status: DC

## 2020-09-13 MED ORDER — LAMOTRIGINE 25 MG PO TABS: 25.0000 mg | ORAL_TABLET | Freq: Every day | ORAL | 0 refills | Status: DC

## 2020-09-13 MED ORDER — RISPERIDONE 3 MG PO TABS: 3.0000 mg | ORAL_TABLET | Freq: Every day | ORAL | 1 refills | Status: DC

## 2020-09-13 MED ORDER — LAMOTRIGINE 100 MG PO TABS: 100.0000 mg | ORAL_TABLET | Freq: Every day | ORAL | 1 refills | Status: DC

## 2020-09-13 NOTE — Progress Notes (Signed)
BH MD/PA/NP OP Progress Note  09/13/2020 12:25 PM Sean Shepherd  MRN:  [id number]  Chief Complaint: Follow-up and medication management  HPI:  Sean Shepherd a 45 year old male with a past psychiatric history significant for schizoaffective disorder (bipolar type) and auditory hallucinations who presents to [facility] follow-up and medication management.  Patient is currently being managed on the following medications:  Risperdal 2 mg at bedtime/1 mg in the morning  Patient reports that he is not doing so well in the morning.  Patient describes feeling "nothing" and never looks forward to getting up in the morning.  Patient endorses feeling miserable all the time and is constantly plagued by the following thoughts: dreading parent's uncertain death, anxiety of the unknown, and dreading the future.  Patient reports that these thoughts loop around in his head.  The only thing patient is able to look forward to now is going to sleep.  Patient has also noticed decreased motivation and finds himself avoiding activities that he used to do like helping his dad out with projects.  Patient states that he has always had anxiety associated with people looking at him since he was 4.  Since being entranced in this depressive state, patient states that his anxiety has ramped up.  Patient rates his anxiety at a 9 out of 10 at this time.  Patient has also had constant thoughts of suicide ideations and is only relieved by these thoughts for brief moments such as when crossing the road.  Patient denies active delusions and a marked decrease in auditory hallucinations.  Patient is interested in being placed on Lamictal for the management of his depressive state.  Patient is apprehensive with being placed on an antidepressant out of fear of being triggered into a manic state.  Patient reports the only antidepressant he has been on has been Lexapro.  Patient is pleasant,  calm, cooperative, and fully engaged in conversation during the encounter.  Patient reports that he feels anxious and is dreading the future.  Patient denies active suicidal or homicidal ideations currently.  He reports that he has noticed a decrease in his auditory hallucinations.  Patient reports experiencing disjointed sleep and has been known to receive between 12 to 14 hours of sleep or 6 to 7 hours.  Patient notes decreased appetite but tries to eat at least 3 meals a day.  Patient denies alcohol consumption (Quit drinking on Dec 24th 2018) and tobacco use (Quit smoking on October 29th 2015).  Patient endorses some CBD oil use.  Before concluding the encounter, patient showed interest in joining a service called [facility] where they provide services and activities for schizophrenic patients.  Patient was encouraged to explore more about all the services provided by [facility].  Visit Diagnosis:    ICD-10-CM   1. Schizoaffective disorder, bipolar type (HCC)  F25.0     Past Psychiatric History:  Schizoaffective disorder/bipolar type  Past Medical History: No past medical history on file. No past surgical history on file.  Family Psychiatric History:  Father - Depression, currently taking Zoloft Sister - unsure, hx of trouble with the law, unsure of med taken  Family History: No family history on file.  Social History:  Social History   Socioeconomic History  . Marital status: Single    Spouse name: Not on file  . Number of children: Not on file  . Years of education: Not on file  . Highest education level: Not on file  Occupational  History  . Not on file  Tobacco Use  . Smoking status: Unknown If Ever Smoked  . Smokeless tobacco: Never Used  Substance and Sexual Activity  . Alcohol use: Not Currently  . Drug use: Yes    Types: Marijuana  . Sexual activity: Not on file  Other Topics Concern  . Not on file  Social History Narrative  . Not on file   Social  Determinants of Health   Financial Resource Strain: Not on file  Food Insecurity: Not on file  Transportation Needs: Not on file  Physical Activity: Not on file  Stress: Not on file  Social Connections: Not on file    Allergies: No Known Allergies  Metabolic Disorder Labs: Lab Results  Component Value Date   HGBA1C 5.7 (H) 06/28/2020   MPG 117 06/28/2020   No results found for: PROLACTIN Lab Results  Component Value Date   CHOL 197 06/28/2020   TRIG 49 06/28/2020   HDL 59 06/28/2020   CHOLHDL 3.3 06/28/2020   VLDL 10 06/28/2020   LDLCALC 128 (H) 06/28/2020   Lab Results  Component Value Date   TSH 1.452 06/28/2020    Therapeutic Level Labs: No results found for: LITHIUM No results found for: VALPROATE No components found for:  CBMZ  Current Medications: Current Outpatient Medications  Medication Sig Dispense Refill  . risperiDONE (RISPERDAL) 1 MG tablet Take 1 tablet (1 mg total) by mouth daily. 30 tablet 1  . risperiDONE (RISPERDAL) 2 MG tablet Take 1 tablet (2 mg total) by mouth at bedtime. 30 tablet 1   No current facility-administered medications for this visit.     Musculoskeletal: Strength & Muscle Tone: within normal limits Gait & Station: normal Patient leans: N/A  Psychiatric Specialty Exam: Review of Systems  Psychiatric/Behavioral: Positive for dysphoric mood. Negative for hallucinations, self-injury, sleep disturbance and suicidal ideas. The patient is nervous/anxious. The patient is not hyperactive.     There were no vitals taken for this visit.There is no height or weight on file to calculate BMI.  General Appearance: Well Groomed  Eye Contact:  Good  Speech:  Clear and Coherent and Normal Rate  Volume:  Normal  Mood:  Anxious, Depressed and Dysphoric  Affect:  Congruent and Depressed  Thought Process:  Coherent, Goal Directed and Descriptions of Associations: Intact  Orientation:  Full (Time, Place, and Person)  Thought Content: WDL and  Logical   Suicidal Thoughts:  No  Homicidal Thoughts:  No  Memory:  Immediate;   Good Recent;   Good Remote;   Good  Judgement:  Good  Insight:  Good  Psychomotor Activity:  Normal  Concentration:  Concentration: Good and Attention Span: Good  Recall:  Good  Fund of Knowledge: Good  Language: Good  Akathisia:  NA  Handed:  Right  AIMS (if indicated): not done  Assets:  Communication Skills Desire for Improvement Housing Physical Health Resilience Social Support  ADL's:  Intact  Cognition: WNL  Sleep:  Good   Screenings: PHQ2-9   Flowsheet Row ED from 06/28/2020 in [facility]  PHQ-2 Total Score 1       Assessment and Plan:  Drury "Bosten Newstrom a 46 year old male with a past psychiatric history significant for schizoaffective disorder (bipolar type) and auditory hallucinations who presents to [facility] for follow-up and medication management.  Patient describes not doing so well especially first thing in the morning.  Patient describes having feelings of nothingness and  being plagued by thoughts that include dreading the uncertain deaths of his parents, dreading the future, and the anxiety of the unknown.  Patient also endorses anxiety, decreased motivation, and frequent suicidal ideations.  Patient reports that the only thing he looks forward to when he wakes up is going back to sleep.  Patient would like to be placed on Lamictal for the management of his depressive symptoms.  Patient shows concern about being placed on an antidepressant out of fear that he might be triggered into a manic state.  Patient is also requesting to take his risperidone together at bedtime.  Writer informed patient that he could take his risperidone altogether (3 mg) at bedtime and that a prescription for Lamictal would be prescribed upon conclusion of the encounter.  Before concluding the encounter, patient showed interest in  joining a service called [facility] where they provide services and activities for schizophrenic patients.  Patient was encouraged to explore more about all the services provided by [facility].  1. Schizoaffective disorder, bipolar type (HCC)  - risperiDONE (RISPERDAL) 3 MG tablet; Take 1 tablet (3 mg total) by mouth at bedtime.  Dispense: 30 tablet; Refill: 1 - lamoTRIgine (LAMICTAL) 25 MG tablet; Take 1 tablet (25 mg total) by mouth daily. Take Lamictal 25 mg for the first 2 weeks. On the third week, continue taking Lamictal 50 mg for 2 weeks with second prescription.  Dispense: 14 tablet; Refill: 0 - lamoTRIgine (LAMICTAL) 25 MG tablet; Take 2 tablets (50 mg total) by mouth daily. On third week, start taking Lamictal 50 mg for 2 more weeks. On the fifth week, continue to take Lamictal 100 mg daily.  Dispense: 28 tablet; Refill: 0 - lamoTRIgine (LAMICTAL) 100 MG tablet; Take 1 tablet (100 mg total) by mouth daily.  Dispense: 30 tablet; Refill: 1  Patient to follow-up in 6 weeks  Meta Hatchet, PA 09/13/2020, 12:25 PM

## 2020-09-14 MED FILL — lamoTRIgine 25 MG TABS: 25 | 14 days supply | Qty: 14 | Fill #0

## 2020-09-14 MED FILL — risperiDONE 3 MG TABS: 3 | 30 days supply | Qty: 30 | Fill #0

## 2020-09-19 ENCOUNTER — Encounter (HOSPITAL_COMMUNITY): Payer: Self-pay | Admitting: Registered Nurse

## 2020-09-19 ENCOUNTER — Ambulatory Visit (HOSPITAL_COMMUNITY)
Admission: EM | Admit: 2020-09-19 | Discharge: 2020-09-19 | Disposition: A | Discharge status: Home / Self Care | Payer: No Payment, Other | Attending: Nurse Practitioner | Admitting: Nurse Practitioner

## 2020-09-19 ENCOUNTER — Other Ambulatory Visit: Payer: Self-pay

## 2020-09-19 DIAGNOSIS — R45851 Suicidal ideations: Secondary | ICD-10-CM | POA: Diagnosis not present

## 2020-09-19 DIAGNOSIS — F250 Schizoaffective disorder, bipolar type: Secondary | ICD-10-CM | POA: Diagnosis not present

## 2020-09-19 HISTORY — DX: Depression, unspecified: F32.A

## 2020-09-19 HISTORY — DX: Schizoaffective disorder, unspecified: F25.9

## 2020-09-19 HISTORY — DX: Bipolar disorder, unspecified: F31.9

## 2020-09-19 NOTE — Discharge Instructions (Addendum)
   Discharge recommendations:  Patient is to take medications as prescribed. Please see information for follow-up appointment with psychiatry and therapy. Please follow up with your primary care provider for all medical related needs.   Therapy: We recommend that patient participate in individual therapy to address mental health concerns.   Atypical antipsychotics: If you are prescribed an atypical antipsychotic, it is recommended that your height, weight, BMI, blood pressure, fasting lipid panel, and fasting blood sugar be monitored by your outpatient providers.  Safety:  The patient should abstain from use of illicit substances/drugs and abuse of any medications. If symptoms worsen or do not continue to improve or if the patient becomes actively suicidal or homicidal then it is recommended that the patient return to the closest hospital emergency department, the [facility], or call 911 for further evaluation and treatment. National Suicide Prevention Lifeline 1-800-SUICIDE or 1-800-273-8255.  
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
## 2020-09-19 NOTE — ED Notes (Signed)
Pt cleared for discharge home, verbalized understanding of discharge instructions.  Left [facility] A&O x 4, no distress noted, accompanied by family.

## 2020-09-19 NOTE — ED Provider Notes (Signed)
Behavioral Health Urgent Care Medical Screening Exam  Patient Name: Sean Shepherd MRN: [id number] Date of Evaluation: 09/19/20 Chief Complaint: Chief Complaint/Presenting Problem: Hallucinations (auditory), anxiety, passive suicidal thoughts Diagnosis:  Final diagnoses:  Schizoaffective disorder, bipolar type (HCC)    History of Present illness: Sean Shepherd is a 45 y.o. male with a history of schizoaffective disorder who presents to [facility] because "I felt like I was spiraling out of control and needed to talk to someone." Patient reports that he has not worked in 5 years and does not have much contact with people. He states not having someone to talk to causes him to ruminate on being lonely. He states that he has intrusive thoughts about wishing he was dead. He states that he sometimes thinks about assisted suicide, but knows that is not an option. States that he would never attempt to kill himself. States that he love his cat and feels that he needs to be around for his cat. He reports that he has not experienced AVH in several months. He does not appear to be responding to internal stimuli, No evidence of delusional thought content. Denies homicidal ideations. Denies substance abuse.   Patient is followed by Otila Back at [facility]. Patient was recently started on lamotrigine. Patient states that he is tolearting well without any side effects.   Patient verbally contracts for safety outside of the hospital.   Psychiatric Specialty Exam  Presentation  General Appearance:Appropriate for Environment; Well Groomed  Eye Contact:Good  Speech:Clear and Coherent; Normal Rate  Speech Volume:Normal  Handedness:Right   Mood and Affect  Mood:Depressed  Affect:Appropriate   Thought Process  Thought Processes:Coherent; Linear  Descriptions of Associations:Intact  Orientation:Full (Time, Place and Person)  Thought Content:Logical  Hallucinations:None denied denied  Ideas of  Reference:None  Suicidal Thoughts:No (Denies current. Reports periods of passive SI)  Homicidal Thoughts:No   Sensorium  Memory:Immediate Good; Recent Good; Remote Good  Judgment:Fair  Insight:Fair   Executive Functions  Concentration:Good  Attention Span:Good  Recall:Good  Fund of Knowledge:Good  Language:Good   Psychomotor Activity  Psychomotor Activity:Normal   Assets  Assets:Communication Skills; Desire for Improvement; Housing; Physical Health; Social Support   Sleep  Sleep:Fair  Number of hours: 0 (anywhere from 4-6 hours in 24 hour time period, but sleep is disjointed sleep)   Physical Exam: Physical Exam Constitutional:      General: He is not in acute distress.    Appearance: He is not ill-appearing, toxic-appearing or diaphoretic.  HENT:     Head: Normocephalic.     Right Ear: External ear normal.     Left Ear: External ear normal.  Eyes:     Pupils: Pupils are equal, round, and reactive to light.  Cardiovascular:     Rate and Rhythm: Normal rate.  Pulmonary:     Effort: Pulmonary effort is normal. No respiratory distress.  Musculoskeletal:        General: Normal range of motion.  Skin:    General: Skin is warm and dry.  Neurological:     Mental Status: He is alert and oriented to person, place, and time.  Psychiatric:        Mood and Affect: Mood is anxious and depressed.        Speech: Speech normal.        Behavior: Behavior is cooperative.        Thought Content: Thought content is not paranoid or delusional. Thought content does not include homicidal or suicidal ideation. Thought content does not include  suicidal plan.    Review of Systems  Constitutional: Negative for chills, diaphoresis, fever, malaise/fatigue and weight loss.  HENT: Negative for congestion.   Respiratory: Negative for cough and shortness of breath.   Cardiovascular: Negative for chest pain and palpitations.  Gastrointestinal: Negative for diarrhea, nausea and  vomiting.  Neurological: Negative for dizziness and seizures.  Psychiatric/Behavioral: Positive for depression and suicidal ideas. Negative for hallucinations, memory loss and substance abuse. The patient is nervous/anxious and has insomnia.   All other systems reviewed and are negative.  Blood pressure 130/78, pulse 62, temperature 98 F (36.7 C), temperature source Tympanic, resp. rate 16, SpO2 99 %. There is no height or weight on file to calculate BMI.  Musculoskeletal: Strength & Muscle Tone: within normal limits Gait & Station: normal Patient leans: N/A  Demographic Factors:  Male, Caucasian, Living alone and Unemployed  Loss Factors: NA  Historical Factors: NA  Risk Reduction Factors:   Sense of responsibility to family, Positive social support and Sense of responsibility to pets  Continued Clinical Symptoms:  Anxiety, schizoaffective disorder  Cognitive Features That Contribute To Risk:  None    Suicide Risk:  Mild:  Suicidal ideation of limited frequency, intensity, duration, and specificity.  There are no identifiable plans, no associated intent, mild dysphoria and related symptoms, good self-control (both objective and subjective assessment), few other risk factors, and identifiable protective factors, including available and accessible social support.   [facility] MSE Discharge Disposition for Follow up and Recommendations: Based on my evaluation the patient does not appear to have an emergency medical condition and can be discharged with resources and follow up care in outpatient services for Medication Management and Individual Therapy   Continue taking medications as currently prescribed. Keep follow up appointments. Discussed open access hours available at [facility].    Jackelyn Poling, NP 09/19/2020, 9:02 PM

## 2020-09-19 NOTE — ED Triage Notes (Signed)
Pt presented to [facility] as a walk-in with complaints of depression.Per pt he has been having sense of dread and worthlessness. Pt also stated decreased sleep and "don't feel like getting up or doing anything." Pt claims this feelings started in December 2021 and it keep getting worse. Pt endorses SI with no plan. Pt denies HI and AVH at this time.

## 2020-09-22 ENCOUNTER — Telehealth (INDEPENDENT_AMBULATORY_CARE_PROVIDER_SITE_OTHER): Payer: No Payment, Other | Admitting: Physician Assistant

## 2020-09-22 ENCOUNTER — Other Ambulatory Visit: Payer: Self-pay

## 2020-09-22 DIAGNOSIS — F250 Schizoaffective disorder, bipolar type: Secondary | ICD-10-CM | POA: Diagnosis not present

## 2020-09-29 ENCOUNTER — Telehealth (HOSPITAL_COMMUNITY): Payer: Self-pay | Admitting: *Deleted

## 2020-09-29 ENCOUNTER — Other Ambulatory Visit (HOSPITAL_COMMUNITY): Payer: Self-pay | Admitting: Physician Assistant

## 2020-09-29 MED ORDER — LAMOTRIGINE 25 MG PO TABS: 25.0000 mg | ORAL_TABLET | Freq: Every day | ORAL | 0 refills | Status: DC

## 2020-09-29 NOTE — Telephone Encounter (Signed)
Provider was contacted by Wynona Luna, RN regarding voice message left by patient.  Patient reports that he has been doing much better since the last encounter with provider. During the last encounter, patient was advised to start taking his Lamictal medication in the morning instead of at night. Patient is still taking Lamictal 25 mg daily.  Patient reports that he has also abstained from hemp products since the last encounter. Patient is unable to attribute his improvement in mood and affect to rescheduling of medication or abstinence from hemp products.  Before concluding the phone call, patient inquired whether or not he should continue taking Lamictal at 25 mg daily or increase to 50 mg daily after a 2 week duration. Patient appears hesitant about increasing dose before seeing provider during his next appointment. Provider advised patient if his mood was ok and if he wasn't experiencing any adverse effects from his medications, then it was ok for him to continue taking Lamictal 25 mg until his next appointment.  Before concluding the phone call, patient request a refill on his Lamictal medication. Patient's medication will be e-prescribed to pharmacy of choice.

## 2020-09-29 NOTE — Telephone Encounter (Signed)
VM left for Clinical research associate with a message for Otila Back PA re checking in on how he is doing on his Lamictal. Will let [name] know he called.

## 2020-10-04 ENCOUNTER — Telehealth (HOSPITAL_COMMUNITY): Payer: Self-pay | Admitting: *Deleted

## 2020-10-04 NOTE — Telephone Encounter (Signed)
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

## 2020-10-04 NOTE — Progress Notes (Signed)
BH MD/PA/NP OP Progress Note  Virtual Visit via Video Note  I connected with Sean Shepherd on 09/21/2020 at  9:00 AM EST by a video enabled telemedicine application and verified that I am speaking with the correct person using two identifiers.  Location: Patient: Home Provider: Clinic   I discussed the limitations of evaluation and management by telemedicine and the availability of in person appointments. The patient expressed understanding and agreed to proceed.  Follow Up Instructions:  I discussed the assessment and treatment plan with the patient. The patient was provided an opportunity to ask questions and all were answered. The patient agreed with the plan and demonstrated an understanding of the instructions.   The patient was advised to call back or seek an in-person evaluation if the symptoms worsen or if the condition fails to improve as anticipated.  I provided 28 minutes of non-face-to-face time during this encounter.   Meta Hatchet, PA   09/22/2020 9:30 AM Sean Shepherd  MRN:  [id number]  Chief Complaint:  Medication management  HPI:   Sean Shepherd is a 45 year old male with a past psychiatric history significant for schizoaffective disorder (bipolar type) and auditory hallucinations who presents to [facility] via virtual video visit for medication management.  Patient is currently being managed on the following medications:  Risperdal 3 mg at bedtime Lamictal 25 mg daily  Patient was recently seen at [facility] 09/19/2020 for auditory hallucinations, anxiety, and passive suicidal thoughts.  While seen at [facility], patient reported that he felt like he was spiraling out of control and needed to talk to someone.  Today, patient states that he has been having a real bad time in general.  Patient reports that he has felt really terrible since taking his risperidone 3 mg at bedtime as well as Lamictal  25 mg at bedtime.  Patient reports that he has been experiencing the following symptoms: feelings of dread, nightmares, anxiety, and panic attacks.  Patient states that his thoughts of death have also increased.  Patient states that he feels worse since taking lamotrigine 25 mg at night and would like to discontinue taking the medication.  Patient endorses depressed mood characterized by lack of motivation, dreading the future, aimlessness, and inactivity.  Patient denies active suicidal ideations, however, his thoughts of suicide were happening a lot since taking lamotrigine.  Patient denies homicidal ideations.  He further denies any active auditory or visual hallucinations.  Patient states that he may occasionally hear sounds that may translate into voices.  Patient endorses poor sleep and states that he may receive roughly 3 to 4 hours of sleep followed by instances of staying in the bed for prolonged periods of time.  Patient endorses decreased appetite and states that he finds himself not wanting to eat anything.  Patient denies alcohol consumption, tobacco use, and illicit drug use.  Visit Diagnosis:    ICD-10-CM   1. Schizoaffective disorder, bipolar type (HCC)  F25.0 lamoTRIgine (LAMICTAL) 25 MG tablet    Past Psychiatric History:  Schizoaffective disorder/bipolar type  Past Medical History:  Past Medical History:  Diagnosis Date   Bipolar disorder (HCC)    Depression    Schizoaffective disorder (HCC)    No past surgical history on file.  Family Psychiatric History:  Father - Depression, currently taking Zoloft Sister - unsure, hx of trouble with the law, unsure of med taken  Family History: No family history on file.  Social History:  Social History  Socioeconomic History   Marital status: Single    Spouse name: Not on file   Number of children: 0   Years of education: Not on file   Highest education level: Not on file  Occupational History   Not on file   Tobacco Use   Smoking status: Former Smoker    Quit date: 07/31/2014    Years since quitting: 6.1   Smokeless tobacco: Never Used  Vaping Use   Vaping Use: Every day   Substances: THC, CBD  Substance and Sexual Activity   Alcohol use: Not Currently   Drug use: Yes    Types: Marijuana   Sexual activity: Never  Other Topics Concern   Not on file  Social History Narrative   Not on file   Social Determinants of Health   Financial Resource Strain: Not on file  Food Insecurity: Not on file  Transportation Needs: Not on file  Physical Activity: Not on file  Stress: Not on file  Social Connections: Not on file    Allergies: No Known Allergies  Metabolic Disorder Labs: Lab Results  Component Value Date   HGBA1C 5.7 (H) 06/28/2020   MPG 117 06/28/2020   No results found for: PROLACTIN Lab Results  Component Value Date   CHOL 197 06/28/2020   TRIG 49 06/28/2020   HDL 59 06/28/2020   CHOLHDL 3.3 06/28/2020   VLDL 10 06/28/2020   LDLCALC 128 (H) 06/28/2020   Lab Results  Component Value Date   TSH 1.452 06/28/2020    Therapeutic Level Labs: No results found for: LITHIUM No results found for: VALPROATE No components found for:  CBMZ  Current Medications: Current Outpatient Medications  Medication Sig Dispense Refill   lamoTRIgine (LAMICTAL) 25 MG tablet Take 1 tablet (25 mg total) by mouth daily. 30 tablet 0   risperiDONE (RISPERDAL) 3 MG tablet Take 1 tablet (3 mg total) by mouth at bedtime. 30 tablet 1   No current facility-administered medications for this visit.     Musculoskeletal: Strength & Muscle Tone: Unable to assess due to telemedicine visit Gait & Station: Unable to assess due to telemedicine visit Patient leans: Unable to assess due to telemedicine visit  Psychiatric Specialty Exam: Review of Systems  Psychiatric/Behavioral: Positive for dysphoric mood and sleep disturbance. Negative for decreased concentration, hallucinations,  self-injury and suicidal ideas (Patient denies active suicide ideations. He states that he does experience thoughts from time to time). The patient is not nervous/anxious and is not hyperactive.     There were no vitals taken for this visit.There is no height or weight on file to calculate BMI.  General Appearance: Well Groomed  Eye Contact:  Good  Speech:  Clear and Coherent and Normal Rate  Volume:  Normal  Mood:  Depressed, Dysphoric and Hopeless  Affect:  Congruent and Depressed  Thought Process:  Coherent, Goal Directed and Descriptions of Associations: Intact  Orientation:  Full (Time, Place, and Person)  Thought Content: WDL   Suicidal Thoughts:  No  Homicidal Thoughts:  No  Memory:  Immediate;   Good Recent;   Good Remote;   Good  Judgement:  Good  Insight:  Good  Psychomotor Activity:  Normal  Concentration:  Concentration: Good and Attention Span: Good  Recall:  Good  Fund of Knowledge: Good  Language: Good  Akathisia:  NA  Handed:  Right  AIMS (if indicated): not done  Assets:  Communication Skills Desire for Improvement Housing Physical Health Resilience Social Support  ADL's:  Intact  Cognition: WNL  Sleep:  Poor   Screenings: PHQ2-9   Flowsheet Row ED from 09/19/2020 in [facility] ED from [facility]/28/2020 in [facility]  PHQ-2 Total Score 6 1  PHQ-9 Total Score 23 --       Assessment and Plan:  Sean Shepherd is a 45 year old male with a past psychiatric history significant for schizoaffective disorder (bipolar type) and auditory hallucinations who presents to [facility] via virtual video visit for medication management.  Patient presents with the following symptoms: depressed mood, lack of motivation, and a sense of dread of the future.  Patient states that since taking lamotrigine with his risperidone at night, he has experienced worsening of his  symptoms and states that he would like to discontinue taking lamotrigine.  Patient was informed that he is supposed to be taking his lamotrigine in the morning instead of at night.  Patient was encouraged to take his lamotrigine in the morning and see if his symptoms improve.  Patient is agreeable to recommendation.  Patient was also concerned about his risperidone use.  Patient reports that he is unable to tell the difference in the management of his symptoms when taking risperidone 2 mg at bedtime instead of 3 mg.  Patient was informed that it was okay for him to continue taking risperidone 2 mg at bedtime if he found that helpful in the management of his symptoms.  1. Schizoaffective disorder, bipolar type (HCC) Patient to continue taking risperidone 2 mg at bedtime  - lamoTRIgine (LAMICTAL) 25 MG tablet; Take 1 tablet (25 mg total) by mouth daily.  Dispense: 30 tablet; Refill: 0  Patient to follow up in 4 weeks  Meta Hatchet, PA 09/21/2020, 9:30 AM

## 2020-10-05 ENCOUNTER — Encounter (HOSPITAL_COMMUNITY): Payer: Self-pay | Admitting: Physician Assistant

## 2020-10-06 ENCOUNTER — Ambulatory Visit (INDEPENDENT_AMBULATORY_CARE_PROVIDER_SITE_OTHER): Payer: No Payment, Other | Admitting: Physician Assistant

## 2020-10-06 ENCOUNTER — Encounter (HOSPITAL_COMMUNITY): Payer: Self-pay | Admitting: Physician Assistant

## 2020-10-06 ENCOUNTER — Other Ambulatory Visit: Payer: Self-pay

## 2020-10-06 VITALS — BP 124/78 | HR 67 | Ht 73.0 in | Wt 154.0 lb

## 2020-10-06 DIAGNOSIS — F250 Schizoaffective disorder, bipolar type: Secondary | ICD-10-CM

## 2020-10-06 NOTE — Progress Notes (Signed)
BH MD/PA/NP OP Progress Note  10/06/2020 11:36 AM Treyveon Mochizuki  MRN:  [id number]  Chief Complaint:  Chief Complaint    Follow-up     HPI:  Sean Shepherd is a 45 year old male with a past psychiatric history significant for schizophrenia (bipolar type) who presents to [facility] for follow-up and medication management.  Patient is currently being managed on the following medications:  Risperdal 2 mg at bedtime Lamictal 25 mg daily  Patient called earlier this week with a complaint of continued issues with his depressive symptoms that he believes are attributed to his current risperidone use.  Patient reports that he has been doing some research recently regarding antipsychotics and read that antipsychotics have the ability to cause depressive symptoms, in particular, risperidone's ability to decrease dopaminergic pathways in the brain.  Patient attributes his past delusions and hallucinations to his abuse of cannabis concentrates.  Patient reports that he has never had hallucinations or manic symptoms in the past and feels that his abuse of cannabis concentrates is what caused his psychotic episode back in October.  Patient endorses crushing depression with the following symptoms: lack of motivation, lack of energy, anhedonia, and sleep disturbances.  Patient is interested in decreasing his doses of risperidone and is considering being placed on an antidepressant.  Patient reports that he continues to experience fleeting thoughts and flashbacks related to his psychosis in October.  Patient denies suicidal ideations, however, at times he feels he cannot go on any further.  Patient denies homicidal ideations.  He further denies auditory or visual hallucinations.  Patient endorses poor sleep and states that he receives on average 2 hours of disturbed sleep.  Patient reports that his appetite has not been that great but he tries to have at least 2  meals per day.  Patient denies alcohol consumption, tobacco use, and illicit drug use.  Patient reports that he is going to abstain from using cannabis from here on out.  Visit Diagnosis:    ICD-10-CM   1. Schizoaffective disorder, bipolar type (HCC)  F25.0     Past Psychiatric History: Schizoaffective disorder/bipolar type  Past Medical History:  Past Medical History:  Diagnosis Date  . Bipolar disorder (HCC)   . Depression   . Schizoaffective disorder (HCC)    No past surgical history on file.  Family Psychiatric History:  Father - Depression, currently taking Zoloft Sister - unsure, hx of trouble with the law, unsure of med taken  Family History: No family history on file.  Social History:  Social History   Socioeconomic History  . Marital status: Single    Spouse name: Not on file  . Number of children: 0  . Years of education: Not on file  . Highest education level: Not on file  Occupational History  . Not on file  Tobacco Use  . Smoking status: Former Smoker    Quit date: 07/31/2014    Years since quitting: 6.1  . Smokeless tobacco: Never Used  Vaping Use  . Vaping Use: Every day  . Substances: THC, CBD  Substance and Sexual Activity  . Alcohol use: Not Currently  . Drug use: Yes    Types: Marijuana  . Sexual activity: Never  Other Topics Concern  . Not on file  Social History Narrative  . Not on file   Social Determinants of Health   Financial Resource Strain: Not on file  Food Insecurity: Not on file  Transportation Needs: Not on file  Physical Activity: Not on file  Stress: Not on file  Social Connections: Not on file    Allergies: No Known Allergies  Metabolic Disorder Labs: Lab Results  Component Value Date   HGBA1C 5.7 (H) 06/28/2020   MPG 117 06/28/2020   No results found for: PROLACTIN Lab Results  Component Value Date   CHOL 197 06/28/2020   TRIG 49 06/28/2020   HDL 59 06/28/2020   CHOLHDL 3.3 06/28/2020   VLDL 10 06/28/2020    LDLCALC 128 (H) 06/28/2020   Lab Results  Component Value Date   TSH 1.452 06/28/2020    Therapeutic Level Labs: No results found for: LITHIUM No results found for: VALPROATE No components found for:  CBMZ  Current Medications: Current Outpatient Medications  Medication Sig Dispense Refill  . lamoTRIgine (LAMICTAL) 25 MG tablet Take 1 tablet (25 mg total) by mouth daily. 30 tablet 0  . risperiDONE (RISPERDAL) 3 MG tablet Take 1 tablet (3 mg total) by mouth at bedtime. 30 tablet 1   No current facility-administered medications for this visit.     Musculoskeletal: Strength & Muscle Tone: within normal limits Gait & Station: normal Patient leans: N/A  Psychiatric Specialty Exam: Review of Systems  Psychiatric/Behavioral: Positive for dysphoric mood and sleep disturbance. Negative for decreased concentration, hallucinations, self-injury and suicidal ideas. The patient is nervous/anxious. The patient is not hyperactive.     Blood pressure 124/78, pulse 67, height 6\' 1"  (1.854 m), weight 154 lb (69.9 kg), SpO2 100 %.Body mass index is 20.32 kg/m.  General Appearance: Well Groomed  Eye Contact:  Good  Speech:  Clear and Coherent and Normal Rate  Volume:  Normal  Mood:  Anxious, Depressed, Dysphoric and Hopeless  Affect:  Congruent and Depressed  Thought Process:  Coherent, Goal Directed and Descriptions of Associations: Intact  Orientation:  Full (Time, Place, and Person)  Thought Content: WDL and Logical   Suicidal Thoughts:  No  Homicidal Thoughts:  No  Memory:  Immediate;   Good Recent;   Good Remote;   Good  Judgement:  Good  Insight:  Good  Psychomotor Activity:  Normal  Concentration:  Concentration: Good and Attention Span: Good  Recall:  Good  Fund of Knowledge: Good  Language: Good  Akathisia:  NA  Handed:  Right  AIMS (if indicated): not done  Assets:  Communication Skills Desire for Improvement Housing Physical Health Resilience Social Support   ADL's:  Intact  Cognition: WNL  Sleep:  Poor   Screenings: PHQ2-9   Flowsheet Row ED from 09/19/2020 in [facility] ED from 06/28/2020 in [facility]  PHQ-2 Total Score 6 1  PHQ-9 Total Score 23 -       Assessment and Plan:  Domenique "[facility]" Budlong is a 45 year old male with a past psychiatric history significant for schizophrenia (bipolar type) who presents to [facility] for follow-up and medication management.  Patient reports that he still experiencing crushing depression with the following symptoms: lack of motivation, lack of energy, anhedonia, and sleep disturbances.  Patient attributes his worsening depression to his risperidone use.  Patient reports that before his drug induced psychosis last October, he had never experienced delusions, hallucinations, or manic symptoms.  Patient would like to discontinue both risperidone and Lamictal.  Patient was encouraged to taper down the doses of his current medications gradually.  Patient was recommended decreasing risperidone dose from 2 mg at bedtime to 1 mg, first.  Patient  was advised to continue taking Lamictal as scheduled.  Patient was agreeable to recommendation.  1. Schizoaffective disorder, bipolar type (HCC) Patient was recommended decreasing risperidone dose from 2 mg at bedtime to 1 mg. -Follow-up with patient on February 10 th to assess patient's experience to tapering down on risperidone.  Patient to continue taking Lamictal 25 mg daily as scheduled  Meta Hatchet, PA 10/06/2020, 11:36 AM

## 2020-10-11 MED FILL — lamoTRIgine 25 MG TABS: 25 | 30 days supply | Qty: 30 | Fill #0

## 2020-10-12 ENCOUNTER — Other Ambulatory Visit (HOSPITAL_COMMUNITY): Payer: Self-pay | Admitting: Physician Assistant

## 2020-10-12 ENCOUNTER — Encounter (HOSPITAL_COMMUNITY): Payer: Self-pay | Admitting: Physician Assistant

## 2020-10-12 ENCOUNTER — Other Ambulatory Visit: Payer: Self-pay

## 2020-10-12 ENCOUNTER — Telehealth (INDEPENDENT_AMBULATORY_CARE_PROVIDER_SITE_OTHER): Payer: No Payment, Other | Admitting: Physician Assistant

## 2020-10-12 DIAGNOSIS — F250 Schizoaffective disorder, bipolar type: Secondary | ICD-10-CM

## 2020-10-12 DIAGNOSIS — F329 Major depressive disorder, single episode, unspecified: Secondary | ICD-10-CM | POA: Diagnosis not present

## 2020-10-12 MED ORDER — SERTRALINE HCL 25 MG PO TABS: 25.0000 mg | ORAL_TABLET | Freq: Every day | ORAL | 1 refills | Status: DC

## 2020-10-12 MED FILL — SERTRALINE HCL 25 MG TABLET: 25 | 30 days supply | Qty: 30 | Fill #0

## 2020-10-12 NOTE — Progress Notes (Signed)
BH MD/PA/NP OP Progress Note  Virtual Visit via Video Note  I connected with Sean Shepherd on 10/12/20 at  8:00 AM EST by a video enabled telemedicine application and verified that I am speaking with the correct person using two identifiers.  Location: Patient: Home Provider: Clinic   I discussed the limitations of evaluation and management by telemedicine and the availability of in person appointments. The patient expressed understanding and agreed to proceed.  Follow Up Instructions:    I discussed the assessment and treatment plan with the patient. The patient was provided an opportunity to ask questions and all were answered. The patient agreed with the plan and demonstrated an understanding of the instructions.   The patient was advised to call back or seek an in-person evaluation if the symptoms worsen or if the condition fails to improve as anticipated.  I provided 20 minutes of non-face-to-face time during this encounter.   Meta Hatchet, PA   10/12/2020 8:38 AM Sean Shepherd  MRN:  [id number]  Chief Complaint: Follow-up appointment  HPI:  Sean Shepherd is a 45 year old male with a past psychiatric history significant for schizophrenia (bipolar type) who presents to [facility] for follow-up. Patient reports that he hasn't noticed a difference in his mood.  Patient still reports depressive symptoms such as difficulty getting out of bed and lack of motivation.  Patient does endorse that he has not experienced any auditory hallucinations.  Patient does admit to certain sounds still bothering him such as birds chirping, but he has not experienced any auditory hallucinations as a direct result from specific sounds.    Patient expresses that he has been experiencing intrusive thoughts that he describes as disturbing in nature.  An example of the thought he had consisted of people being shredded apart.  Patient reports that he  does not feel as hopeful about his depressive symptoms dissipating after discontinuing Risperidone.  Patient shows some concerns about reexperiencing his auditory hallucinations once he has completely discontinued risperidone.  Patient is pleasant, calm, cooperative, and fully engaged in conversation during the encounter.  Patient endorses depressed mood, however, he states that he was able to go to [employer] the other day.  Patient denies suicidal and homicidal ideations.  He further denies auditory or visual hallucinations.  Patient states that he is able to discern his past auditory hallucinations to what he is experiencing now which are disturbing thoughts and visions that flash into his head.  Patient endorses disjointed sleep 4 to 5 hours per sleep each night.  He states that his sleep is constantly plagued by nightmares that keep him up.  Patient reports that his appetite has improved and that he eats on average three meals per day.  Patient denies alcohol use, tobacco use, and illicit drug use.  Patient reports that he has been off of hemp and delta eight products for roughly a week now. He states that it will be difficult for him living without a substance but he will manage the best he can.  Visit Diagnosis:    ICD-10-CM   1. Schizoaffective disorder, bipolar type (HCC)  F25.0   2. Major depressive disorder with current active episode, unspecified depression episode severity, unspecified whether recurrent  F32.9 sertraline (ZOLOFT) 25 MG tablet    Past Psychiatric History:  Schizoaffective disorder/bipolar type  Past Medical History:  Past Medical History:  Diagnosis Date  . Bipolar disorder (HCC)   . Depression   . Schizoaffective disorder (HCC)  History reviewed. No pertinent surgical history.  Family Psychiatric History:  Father - Depression, currently taking Zoloft Sister - unsure, hx of trouble with the law, unsure of med taken  Family History: History reviewed. No  pertinent family history.  Social History:  Social History   Socioeconomic History  . Marital status: Single    Spouse name: Not on file  . Number of children: 0  . Years of education: Not on file  . Highest education level: Not on file  Occupational History  . Not on file  Tobacco Use  . Smoking status: Former Smoker    Quit date: 07/31/2014    Years since quitting: 6.2  . Smokeless tobacco: Never Used  Vaping Use  . Vaping Use: Every day  . Substances: THC, CBD  Substance and Sexual Activity  . Alcohol use: Not Currently  . Drug use: Yes    Types: Marijuana  . Sexual activity: Never  Other Topics Concern  . Not on file  Social History Narrative  . Not on file   Social Determinants of Health   Financial Resource Strain: Not on file  Food Insecurity: Not on file  Transportation Needs: Not on file  Physical Activity: Not on file  Stress: Not on file  Social Connections: Not on file    Allergies: No Known Allergies  Metabolic Disorder Labs: Lab Results  Component Value Date   HGBA1C 5.7 (H) 06/28/2020   MPG 117 06/28/2020   No results found for: PROLACTIN Lab Results  Component Value Date   CHOL 197 06/28/2020   TRIG 49 06/28/2020   HDL 59 06/28/2020   CHOLHDL 3.3 06/28/2020   VLDL 10 06/28/2020   LDLCALC 128 (H) 06/28/2020   Lab Results  Component Value Date   TSH 1.452 06/28/2020    Therapeutic Level Labs: No results found for: LITHIUM No results found for: VALPROATE No components found for:  CBMZ  Current Medications: Current Outpatient Medications  Medication Sig Dispense Refill  . sertraline (ZOLOFT) 25 MG tablet Take 1 tablet (25 mg total) by mouth daily. 30 tablet 1  . lamoTRIgine (LAMICTAL) 25 MG tablet Take 1 tablet (25 mg total) by mouth daily. 30 tablet 0  . risperiDONE (RISPERDAL) 3 MG tablet Take 1 tablet (3 mg total) by mouth at bedtime. 30 tablet 1   No current facility-administered medications for this visit.      Musculoskeletal: Strength & Muscle Tone: Unable to assess due to telemedicine visit Gait & Station: Unable to assess due to telemedicine visit Patient leans: Unable to assess due to telemedicine visit  Psychiatric Specialty Exam: Review of Systems  Psychiatric/Behavioral: Positive for dysphoric mood and sleep disturbance. Negative for decreased concentration, hallucinations, self-injury and suicidal ideas. The patient is not nervous/anxious and is not hyperactive.     There were no vitals taken for this visit.There is no height or weight on file to calculate BMI.  General Appearance: Well Groomed  Eye Contact:  Good  Speech:  Clear and Coherent and Normal Rate  Volume:  Normal  Mood:  Dysphoric  Affect:  Congruent and Depressed  Thought Process:  Coherent, Goal Directed and Descriptions of Associations: Intact  Orientation:  Full (Time, Place, and Person)  Thought Content: WDL and Logical   Suicidal Thoughts:  No  Homicidal Thoughts:  No  Memory:  Immediate;   Good Recent;   Good Remote;   Good  Judgement:  Good  Insight:  Good  Psychomotor Activity:  Normal  Concentration:  Concentration: Good and Attention Span: Good  Recall:  Good  Fund of Knowledge: Good  Language: Good  Akathisia:  NA  Handed:  Right  AIMS (if indicated): not done  Assets:  Communication Skills Desire for Improvement Housing Physical Health Resilience Social Support  ADL's:  Intact  Cognition: WNL  Sleep:  Fair   Screenings: PHQ2-9   Flowsheet Row ED from 09/19/2020 in [facility] ED from [facility]/28/2020 in [facility]  PHQ-2 Total Score 6 1  PHQ-9 Total Score 23 -    Flowsheet Row ED from 09/19/2020 in [facility] ED from 06/28/2020 in [facility]  C-SSRS RISK CATEGORY Low Risk No Risk       Assessment and Plan:  Riddick "[name]" Shepherd is a 45 year old male with a past  psychiatric history significant for schizophrenia (bipolar type) who presents to [facility] for follow-up.  Patient is currently in the process of tapering down on his risperidone.  Patient is currently taking 1 mg daily and will discontinue by tomorrow.  Patient denies auditory hallucinations but expresses that he is still depressed.  Patient reports that he is also plagued with intrusive thoughts and nightmares that keep him from sleeping.  Patient was recommended starting sertraline 25 mg daily for the management of his depressed mood, PTSD, and intrusive thoughts.  Patient is agreeable to plan.  Patient was advised to continue taking Lamictal 25 mg daily.  Patient's medications will be e-prescribed to pharmacy of choice.  1. Schizoaffective disorder, bipolar type (HCC) Patient is currently taking risperidone 1 mg daily.  Patient will discontinue taking medication by tomorrow.  Patient schizoaffective disorder will be reassessed on October 26, 2020. Patient to continue taking Lamictal 25 mg daily  2. Major depressive disorder with current active episode, unspecified depression episode severity, unspecified whether recurrent Patient to continue taking Lamictal 25 mg daily  - sertraline (ZOLOFT) 25 MG tablet; Take 1 tablet (25 mg total) by mouth daily.  Dispense: 30 tablet; Refill: 1  Patient to follow-up on October 26, 2020.  Meta Hatchet, PA 10/12/2020, 8:38 AM

## 2020-10-17 ENCOUNTER — Telehealth (HOSPITAL_COMMUNITY): Payer: Self-pay | Admitting: *Deleted

## 2020-10-17 NOTE — Telephone Encounter (Signed)
Call from [place] stating he had recently come off the Risperdal and since coming off he hasnt been sleeping. This am in desperation he took a Risperdal and slept for one hour. He also feels his auditory hallucinations are returning and wants to speak with Maloy PA about it.  Will forward his concern.

## 2020-10-26 ENCOUNTER — Ambulatory Visit (HOSPITAL_COMMUNITY): Payer: No Payment, Other | Admitting: Physician Assistant

## 2020-10-26 ENCOUNTER — Other Ambulatory Visit (HOSPITAL_COMMUNITY): Payer: Self-pay | Admitting: Physician Assistant

## 2020-10-26 ENCOUNTER — Ambulatory Visit (INDEPENDENT_AMBULATORY_CARE_PROVIDER_SITE_OTHER): Payer: No Payment, Other | Admitting: Physician Assistant

## 2020-10-26 ENCOUNTER — Encounter (HOSPITAL_COMMUNITY): Payer: Self-pay | Admitting: Physician Assistant

## 2020-10-26 ENCOUNTER — Other Ambulatory Visit: Payer: Self-pay

## 2020-10-26 VITALS — BP 119/92 | HR 58 | Ht 71.0 in | Wt 151.0 lb

## 2020-10-26 DIAGNOSIS — G4700 Insomnia, unspecified: Secondary | ICD-10-CM

## 2020-10-26 DIAGNOSIS — F411 Generalized anxiety disorder: Secondary | ICD-10-CM | POA: Insufficient documentation

## 2020-10-26 DIAGNOSIS — F332 Major depressive disorder, recurrent severe without psychotic features: Secondary | ICD-10-CM | POA: Diagnosis not present

## 2020-10-26 DIAGNOSIS — F410 Panic disorder [episodic paroxysmal anxiety] without agoraphobia: Secondary | ICD-10-CM | POA: Diagnosis not present

## 2020-10-26 MED ORDER — SERTRALINE HCL 50 MG PO TABS: 50.0000 mg | ORAL_TABLET | Freq: Every day | ORAL | 1 refills | Status: DC

## 2020-10-26 MED ORDER — TRAZODONE HCL 50 MG PO TABS: 50.0000 mg | ORAL_TABLET | Freq: Every day | ORAL | 1 refills | Status: DC

## 2020-10-26 MED FILL — traZODone HCL 50 MG TABS: 50 | 30 days supply | Qty: 30 | Fill #0

## 2020-10-26 MED FILL — SERTRALINE HCL 50 MG TABLET: 50 | 30 days supply | Qty: 30 | Fill #0

## 2020-10-26 NOTE — Progress Notes (Signed)
BH MD/PA/NP OP Progress Note  10/26/2020 12:27 PM Sean Shepherd  MRN:  [id number]  Chief Complaint:  Chief Complaint    Anxiety     HPI:   Sean Shepherd is a 45 year old male with a past psychiatric history significant for schizoaffective (bipolar type) who presents to [facility] for follow-up and medication management.  Patient is currently being managed on the following medications:  Lamotrigine 25 mg daily Sertraline 25 mg daily  Patient reports that since discontinuing his risperidone he feels worse.  Patient states that he is still a little bothered by random sounds but states he is not hearing any hallucinations due to those sounds.  Patient reports that he feels like he is in a state of panic and dread.  Patient reports that he has experienced panic attacks since the last encounter.  Patient experiences the following symptoms during his panic attacks: difficulty breathing, shortness of breath, and heart palpitations. 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  Patient reports that everything causes him extreme anxiety to the point where he is unable to do his activities of daily living.  Patient endorses the following depressive symptoms: lack of interest in activities and lack of motivation.  Patient is calm, cooperative, and fully engaged in conversation during the encounter.  Patient reports that he is extremely anxious and is scared about his current state and if he will ever improve.  Patient denies suicidal and homicidal ideations.  Patient further denies auditory or visual hallucinations.  Patient endorses poor sleep and receives on average 1 to 2 hours of intermittent sleep.  Patient reports that he is often woken up by both nightmares and pleasant dreams.   Patient states that on a few occasions he has taken his leftover risperidone in an attempt to get some sleep.  Patient endorses appetite and eats on average 1-2 meals per day.  Patient denies alcohol consumption, tobacco use, and illicit drug use.   Visit Diagnosis:    ICD-10-CM   1. Severe episode of recurrent major depressive disorder, without psychotic features (HCC)  F33.2 sertraline (ZOLOFT) 50 MG tablet  2. Generalized anxiety disorder  F41.1 sertraline (ZOLOFT) 50 MG tablet  3. Panic disorder  F41.0 sertraline (ZOLOFT) 50 MG tablet  4. Insomnia, unspecified type  G47.00 traZODone (DESYREL) 50 MG tablet    Past Psychiatric History:  Schizoaffective disorder (bipolar type) possibly substance induced Major depressive disorder Generalized anxiety disorder Panic disorder Insomnia  Past Medical History:  Past Medical History:  Diagnosis Date  . Bipolar disorder (HCC)   . Depression   . Schizoaffective disorder (HCC)    No past surgical history on file.  Family Psychiatric History:  Father - Depression, currently taking Zoloft Sister - unsure, hx of trouble with the law, unsure of med taken  Family History: No family history on file.  Social History:  Social History   Socioeconomic History  . Marital status: Single    Spouse name: Not on file  . Number of children: 0  . Years of education: Not on file  . Highest education level: Not on file  Occupational History  . Not on file  Tobacco Use  . Smoking status: Former Smoker    Quit date: 07/31/2014    Years since quitting: 6.2  . Smokeless tobacco: Never Used  Vaping Use  .  Vaping Use: Every day  . Substances: THC, CBD  Substance and Sexual Activity  . Alcohol use: Not Currently  . Drug use: Yes    Types: Marijuana  . Sexual activity: Never  Other Topics Concern  . Not on file  Social History Narrative  . Not on file   Social Determinants of Health   Financial Resource Strain: Not on file  Food  Insecurity: Not on file  Transportation Needs: Not on file  Physical Activity: Not on file  Stress: Not on file  Social Connections: Not on file    Allergies: No Known Allergies  Metabolic Disorder Labs: Lab Results  Component Value Date   HGBA1C 5.7 (H) 06/28/2020   MPG 117 06/28/2020   No results found for: PROLACTIN Lab Results  Component Value Date   CHOL 197 06/28/2020   TRIG 49 06/28/2020   HDL 59 06/28/2020   CHOLHDL 3.3 06/28/2020   VLDL 10 06/28/2020   LDLCALC 128 (H) 06/28/2020   Lab Results  Component Value Date   TSH 1.452 06/28/2020    Therapeutic Level Labs: No results found for: LITHIUM No results found for: VALPROATE No components found for:  CBMZ  Current Medications: Current Outpatient Medications  Medication Sig Dispense Refill  . lamoTRIgine (LAMICTAL) 25 MG tablet Take 1 tablet (25 mg total) by mouth daily. 30 tablet 0  . traZODone (DESYREL) 50 MG tablet Take 1 tablet (50 mg total) by mouth at bedtime. 30 tablet 1  . risperiDONE (RISPERDAL) 3 MG tablet Take 1 tablet (3 mg total) by mouth at bedtime. (Patient not taking: Reported on 10/26/2020) 30 tablet 1  . sertraline (ZOLOFT) 50 MG tablet Take 1 tablet (50 mg total) by mouth daily. 30 tablet 1   No current facility-administered medications for this visit.     Musculoskeletal: Strength & Muscle Tone: within normal limits Gait & Station: normal Patient leans: N/A  Psychiatric Specialty Exam: Review of Systems  Psychiatric/Behavioral: Positive for agitation, dysphoric mood and sleep disturbance. Negative for decreased concentration, hallucinations, self-injury and suicidal ideas. The patient is nervous/anxious. The patient is not hyperactive.     Blood pressure (!) 119/92, pulse (!) 58, height 5\' 11"  (1.803 m), weight 151 lb (68.5 kg), SpO2 99 %.Body mass index is 21.06 kg/m.  General Appearance: Well Groomed  Eye Contact:  Good  Speech:  Clear and Coherent and Normal Rate  Volume:   Normal  Mood:  Anxious, Depressed and Dysphoric  Affect:  Congruent and Depressed  Thought Process:  Coherent, Goal Directed and Descriptions of Associations: Intact  Orientation:  Full (Time, Place, and Person)  Thought Content: WDL and Logical   Suicidal Thoughts:  No  Homicidal Thoughts:  No  Memory:  Immediate;   Good Recent;   Good Remote;   Good  Judgement:  Good  Insight:  Good  Psychomotor Activity:  Normal  Concentration:  Concentration: Good and Attention Span: Good  Recall:  Good  Fund of Knowledge: Good  Language: Good  Akathisia:  NA  Handed:  Right  AIMS (if indicated): not done  Assets:  Communication Skills Desire for Improvement Housing Physical Health Resilience Social Support  ADL's:  Intact  Cognition: WNL  Sleep:  Poor   Screenings: GAD-7   Office Visit from 10/26/2020 in [facility]  Total GAD-7 Score 19    PHQ2-9   Flowsheet Row Office Visit from 10/26/2020 in [facility] ED from 09/19/2020 in [facility] ED from 06/28/2020 in [facility]  PHQ-2 Total Score 6 6 1   PHQ-9 Total Score 25 23 -    Flowsheet Row Office Visit from 10/26/2020 in [facility] ED from 09/19/2020 in [facility] ED from 06/28/2020 in [facility]  C-SSRS RISK CATEGORY Low Risk Low Risk No Risk       Assessment and Plan:  Sean Shepherd is a 45 year old male with a past psychiatric history significant for schizoaffective (bipolar type) who presents to [facility] for follow-up and medication management.  Patient reports that he is experiencing worsening anxiety and panic attacks since the last encounter.  Patient's sleep has also been affected and he reports that he is receiving roughly 1 to 2 hours of intermittent sleep.   Patient reports that he has taken risperidone a few times to help him sleep. Patient reports that he still plagued with vivid flashbacks related to his past substance induced psychosis back in October.  Patient was recommended increasing his dosage of sertraline from 25 mg to 50 mg for the management of his anxiety and panic attacks.  Patient was also recommended trazodone 50 mg at bedtime for the management of his insomnia.  Patient was agreeable to recommendations.  Patient states that he does not feel like his lamotrigine has been helpful in the management of his symptoms.  Patient was advised to titrate down on his dose of lamotrigine to 12.5 mg and to take for 2 weeks before discontinuing.  Patient was advised to also stop taking risperidone since it is not being prescribed to him anymore for the management of his symptoms.  Patient was agreeable to recommendations.  1. Severe episode of recurrent major depressive disorder, without psychotic features (HCC)  - sertraline (ZOLOFT) 50 MG tablet; Take 1 tablet (50 mg total) by mouth daily.  Dispense: 30 tablet; Refill: 1  2. Generalized anxiety disorder  - sertraline (ZOLOFT) 50 MG tablet; Take 1 tablet (50 mg total) by mouth daily.  Dispense: 30 tablet; Refill: 1  3. Panic disorder  - sertraline (ZOLOFT) 50 MG tablet; Take 1 tablet (50 mg total) by mouth daily.  Dispense: 30 tablet; Refill: 1  4. Insomnia, unspecified type  - traZODone (DESYREL) 50 MG tablet; Take 1 tablet (50 mg total) by mouth at bedtime.  Dispense: 30 tablet; Refill: 1  Patient to follow up in 2 weeks  November, PA 10/26/2020, 12:27 PM

## 2020-10-30 ENCOUNTER — Ambulatory Visit: Payer: Self-pay | Admitting: *Deleted

## 2020-10-30 ENCOUNTER — Other Ambulatory Visit: Payer: Self-pay

## 2020-10-30 ENCOUNTER — Ambulatory Visit (HOSPITAL_COMMUNITY)
Admission: EM | Admit: 2020-10-30 | Discharge: 2020-10-30 | Disposition: A | Discharge status: Home / Self Care | Payer: Self-pay | Attending: Urgent Care | Admitting: Urgent Care

## 2020-10-30 ENCOUNTER — Ambulatory Visit (INDEPENDENT_AMBULATORY_CARE_PROVIDER_SITE_OTHER): Payer: Self-pay

## 2020-10-30 DIAGNOSIS — F411 Generalized anxiety disorder: Secondary | ICD-10-CM

## 2020-10-30 DIAGNOSIS — R0602 Shortness of breath: Secondary | ICD-10-CM

## 2020-10-30 DIAGNOSIS — R0789 Other chest pain: Secondary | ICD-10-CM

## 2020-10-30 DIAGNOSIS — Z87891 Personal history of nicotine dependence: Secondary | ICD-10-CM

## 2020-10-30 NOTE — Telephone Encounter (Signed)
Patient calls with worsening SOB over one month. Stated he has a long vaping history and quit about 3 weeks ago. Has a productive cough and intermittent CP, and anxiety for which he is being treated by behavioral health. He has no pcp established but appointment made for establishment in June. Advised UC at this time for evaluation of SOB and CP. Patient verbalized he would go to UC today.  Reason for Disposition . [1] MODERATE difficulty breathing (e.g., speaks in phrases, SOB even at rest, pulse 100-120) AND [2] NEW-onset or WORSE than normal  Answer Assessment - Initial Assessment Questions 1. RESPIRATORY STATUS: "Describe your breathing?" (e.g., wheezing, shortness of breath, unable to speak, severe coughing)      All day SOB, cough 2. ONSET: "When did this breathing problem begin?"      One month ago 3. PATTERN "Does the difficult breathing come and go, or has it been constant since it started?"      Comes and goes 4. SEVERITY: "How bad is your breathing?" (e.g., mild, moderate, severe)    - MILD: No SOB at rest, mild SOB with walking, speaks normally in sentences, can lay down, no retractions, pulse < 100.    - MODERATE: SOB at rest, SOB with minimal exertion and prefers to sit, cannot lie down flat, speaks in phrases, mild retractions, audible wheezing, pulse 100-120.    - SEVERE: Very SOB at rest, speaks in single words, struggling to breathe, sitting hunched forward, retractions, pulse > 120      moderate 5. RECURRENT SYMPTOM: "Have you had difficulty breathing before?" If Yes, ask: "When was the last time?" and "What happened that time?"      no 6. CARDIAC HISTORY: "Do you have any history of heart disease?" (e.g., heart attack, angina, bypass surgery, angioplasty)      No, then reports he used to have PVCs 7. LUNG HISTORY: "Do you have any history of lung disease?"  (e.g., pulmonary embolus, asthma, emphysema)     unknown 8. CAUSE: "What do you think is causing the breathing  problem?"      Maybe long term vaping 9. OTHER SYMPTOMS: "Do you have any other symptoms? (e.g., dizziness, runny nose, cough, chest pain, fever)     Intermittent chest pain. 10. PREGNANCY: "Is there any chance you are pregnant?" "When was your last menstrual period?"       na 11. TRAVEL: "Have you traveled out of the country in the last month?" (e.g., travel history, exposures)       no  Protocols used: BREATHING DIFFICULTY-A-AH

## 2020-10-30 NOTE — ED Triage Notes (Signed)
Pt is present today with SOB, pt states that it started a couple months ago but has gotten a lot worse the last few days.  Pt states that when he cant catch his breath that he experiences chest tightness.

## 2020-10-30 NOTE — ED Provider Notes (Signed)
[facility]   MRN: [id number] DOB: 08-05-1976  Subjective:   Sean Shepherd is a 45 y.o. male presenting for several month history of acute on chronic shortness of breath worse in the past 2 to 3 days.  Feels tightness around his chest as well.  Patient is a former smoker, used to vape as well and smoke marijuana.  Denies fever, chest pain, nausea, vomiting, abdominal pain.  Patient does have a hard time with anxiety and states that he has previously had PVCs but does not want an EKG.  He does see a provider regularly for management of his mental health symptoms.  No current facility-administered medications for this encounter.  Current Outpatient Medications:  .  lamoTRIgine (LAMICTAL) 25 MG tablet, Take 1 tablet (25 mg total) by mouth daily., Disp: 30 tablet, Rfl: 0 .  risperiDONE (RISPERDAL) 3 MG tablet, Take 1 tablet (3 mg total) by mouth at bedtime. (Patient not taking: No sig reported), Disp: 30 tablet, Rfl: 1 .  sertraline (ZOLOFT) 50 MG tablet, Take 1 tablet (50 mg total) by mouth daily., Disp: 30 tablet, Rfl: 1 .  traZODone (DESYREL) 50 MG tablet, Take 1 tablet (50 mg total) by mouth at bedtime., Disp: 30 tablet, Rfl: 1   No Known Allergies  Past Medical History:  Diagnosis Date  . Bipolar disorder (HCC)   . Depression   . Schizoaffective disorder (HCC)      No past surgical history on file.  No family history on file.  Social History   Tobacco Use  . Smoking status: Former Smoker    Quit date: 07/31/2014    Years since quitting: 6.2  . Smokeless tobacco: Never Used  Vaping Use  . Vaping Use: Every day  . Substances: THC, CBD  Substance Use Topics  . Alcohol use: Not Currently  . Drug use: Yes    Types: Marijuana    ROS   Objective:   Vitals: BP 132/81 (BP Location: Left Arm)   Pulse 60   Temp (!) 97.4 F (36.3 C)   Resp 17   SpO2 95%   Physical Exam Constitutional:      General: He is not in acute distress.    Appearance:  Normal appearance. He is well-developed. He is not ill-appearing, toxic-appearing or diaphoretic.  HENT:     Head: Normocephalic and atraumatic.     Right Ear: External ear normal.     Left Ear: External ear normal.     Nose: Nose normal.     Mouth/Throat:     Mouth: Mucous membranes are moist.     Pharynx: Oropharynx is clear.  Eyes:     General: No scleral icterus.    Extraocular Movements: Extraocular movements intact.     Pupils: Pupils are equal, round, and reactive to light.  Cardiovascular:     Rate and Rhythm: Normal rate and regular rhythm.     Heart sounds: Normal heart sounds. No murmur heard. No friction rub. No gallop.   Pulmonary:     Effort: Pulmonary effort is normal. No respiratory distress.     Breath sounds: Normal breath sounds. No stridor. No wheezing, rhonchi or rales.  Neurological:     Mental Status: He is alert and oriented to person, place, and time.  Psychiatric:        Mood and Affect: Mood normal.        Behavior: Behavior normal.        Thought Content: Thought content normal.  DG Chest 2 View  Result Date: 10/30/2020 CLINICAL DATA:  Shortness of breath.  Chest tightness. EXAM: CHEST - 2 VIEW COMPARISON:  None. FINDINGS: The heart size and mediastinal contours are within normal limits. Both lungs are clear. The visualized skeletal structures are unremarkable. IMPRESSION: No active cardiopulmonary disease. Electronically Signed   By: Norva Pavlov M.D.   On: 10/30/2020 18:30    Assessment and Plan :   PDMP not reviewed this encounter.  1. Shortness of breath   2. Chest tightness   3. Anxiety state     Patient's primary concern was to make sure he did not have something in his lungs especially related to his history of smoking.  He declined any further testing due to cost burden as he is self-pay.  Reassured patient that his chest x-ray is normal and he has a good cardiopulmonary exam.  Offered hydroxyzine to help with some of his anxiety but  patient declined and will follow up with his mental health provider. Counseled patient on potential for adverse effects with medications prescribed/recommended today, ER and return-to-clinic precautions discussed, patient verbalized understanding.    Wallis Bamberg, [place] 10/30/20 1902

## 2020-10-31 NOTE — Telephone Encounter (Signed)
Patient was contacted and informed to discontinue taking risperidone. Patient was reassured that all issues stated during the phone conversation will be addressed during the next encounter.

## 2020-11-02 ENCOUNTER — Ambulatory Visit (HOSPITAL_COMMUNITY): Payer: Self-pay

## 2020-11-07 ENCOUNTER — Other Ambulatory Visit (HOSPITAL_COMMUNITY): Payer: Self-pay | Admitting: Physician Assistant

## 2020-11-07 ENCOUNTER — Ambulatory Visit (INDEPENDENT_AMBULATORY_CARE_PROVIDER_SITE_OTHER): Payer: No Payment, Other | Admitting: Physician Assistant

## 2020-11-07 ENCOUNTER — Encounter (HOSPITAL_COMMUNITY): Payer: Self-pay | Admitting: Physician Assistant

## 2020-11-07 ENCOUNTER — Other Ambulatory Visit: Payer: Self-pay

## 2020-11-07 VITALS — BP 125/88 | HR 72 | Ht 71.0 in | Wt 147.8 lb

## 2020-11-07 DIAGNOSIS — F410 Panic disorder [episodic paroxysmal anxiety] without agoraphobia: Secondary | ICD-10-CM | POA: Diagnosis not present

## 2020-11-07 DIAGNOSIS — G4700 Insomnia, unspecified: Secondary | ICD-10-CM | POA: Diagnosis not present

## 2020-11-07 DIAGNOSIS — F332 Major depressive disorder, recurrent severe without psychotic features: Secondary | ICD-10-CM | POA: Diagnosis not present

## 2020-11-07 DIAGNOSIS — F411 Generalized anxiety disorder: Secondary | ICD-10-CM | POA: Diagnosis not present

## 2020-11-07 MED ORDER — HYDROXYZINE HCL 10 MG PO TABS: 10.0000 mg | ORAL_TABLET | Freq: Three times a day (TID) | ORAL | 1 refills | Status: DC | PRN

## 2020-11-07 MED ORDER — SERTRALINE HCL 100 MG PO TABS: 100.0000 mg | ORAL_TABLET | Freq: Every day | ORAL | 1 refills | Status: DC

## 2020-11-07 MED ORDER — TRAZODONE HCL 100 MG PO TABS: 100.0000 mg | ORAL_TABLET | Freq: Every day | ORAL | 1 refills | Status: DC

## 2020-11-07 MED FILL — TRAZODONE HCL 100 MG TABS: 100 | 30 days supply | Qty: 30 | Fill #0

## 2020-11-07 MED FILL — SERTRALINE HCL 100 MG TAB: 100 | 30 days supply | Qty: 30 | Fill #0

## 2020-11-07 MED FILL — hydrOXYzine HCL 10 MG TABS: 10 | 25 days supply | Qty: 75 | Fill #0

## 2020-11-07 NOTE — Progress Notes (Signed)
BH MD/PA/NP OP Progress Note  11/07/2020 12:47 PM Sean Shepherd  MRN:  [id number]  Chief Complaint:  Chief Complaint    Medication Management     HPI:   Sean Shepherd "Sean Shepherd" Isip is a 45 year old male with a past psychiatric history significant for major depressive disorder, generalized anxiety disorder, panic disorder, and insomnia who presents to [facility] follow-up and medication management.  Patient is currently being managed on the following medications:  Sertraline 50 mg daily Trazodone 50 mg at bedtime  Patient reports that he is still experiencing difficulty with managing his depression and anxiety.  Patient endorses shortness of breath and states that he has been unable to get a full breath of air in at times.  Patient reports that he had to recently admit himself to [facility] due to worsening shortness of breath.  In addition to shortness of breath, patient endorses dizziness, fainting, and chest tightness.  At times, patient states that he feels like he notices a decrease in his heartbeat when dealing with bouts of anxiety.  Patient expresses that his current symptoms are paralyzing and have him unable to do or enjoy things.  He believes that his past use of THC concentrates may have damaged his brain beyond repair.  He reports that he has no benefit in the management of his depression or anxiety while on sertraline.  A PHQ9 screen was performed with the patient scoring a 26.  A GAD7 screen was also performed with the patient scoring a 19.  Patient is anxious yet cooperative and fully engaged in conversation during the encounter.  Patient reports that he is scared and very anxious about what is happening to him currently.  He reports that he is afraid that he is dying and is fearful that he will not be able to get help that he needs.  Patient denies suicidal or homicidal ideations.  Patient does report that he experiences intrusive  thoughts.  Patient denies auditory or visual hallucinations, however, he does express that at times he feels like the room is moving around him.  Patient endorses poor sleep and receives on average 1 to 3 hours of broken/disjointed sleep each night.  Patient endorses fair appetite and eats on average two meals per day.  Patient denies alcohol consumption, tobacco use, and illicit drug use.   Visit Diagnosis:    ICD-10-CM   1. Insomnia, unspecified type  G47.00   2. Severe episode of recurrent major depressive disorder, without psychotic features (HCC)  F33.2   3. Generalized anxiety disorder  F41.1   4. Panic disorder  F41.0     Past Psychiatric History:  Schizoaffective disorder (bipolar type) possibly substance induced Major depressive disorder Generalized anxiety disorder Panic disorder Insomnia  Past Medical History:  Past Medical History:  Diagnosis Date  . Bipolar disorder (HCC)   . Depression   . Schizoaffective disorder (HCC)    History reviewed. No pertinent surgical history.  Family Psychiatric History:  Father - Depression, currently taking Zoloft Sister - unsure, hx of trouble with the law, unsure of med taken  Family History: History reviewed. No pertinent family history.  Social History:  Social History   Socioeconomic History  . Marital status: Single    Spouse name: Not on file  . Number of children: 0  . Years of education: Not on file  . Highest education level: Not on file  Occupational History  . Not on file  Tobacco Use  .  Smoking status: Former Smoker    Quit date: 07/31/2014    Years since quitting: 6.2  . Smokeless tobacco: Never Used  Vaping Use  . Vaping Use: Every day  . Substances: THC, CBD  Substance and Sexual Activity  . Alcohol use: Not Currently  . Drug use: Yes    Types: Marijuana  . Sexual activity: Never  Other Topics Concern  . Not on file  Social History Narrative  . Not on file   Social Determinants of Health    Financial Resource Strain: Not on file  Food Insecurity: Not on file  Transportation Needs: Not on file  Physical Activity: Not on file  Stress: Not on file  Social Connections: Not on file    Allergies: No Known Allergies  Metabolic Disorder Labs: Lab Results  Component Value Date   HGBA1C 5.7 (H) 06/28/2020   MPG 117 06/28/2020   No results found for: PROLACTIN Lab Results  Component Value Date   CHOL 197 06/28/2020   TRIG 49 06/28/2020   HDL 59 06/28/2020   CHOLHDL 3.3 06/28/2020   VLDL 10 06/28/2020   LDLCALC 128 (H) 06/28/2020   Lab Results  Component Value Date   TSH 1.452 06/28/2020    Therapeutic Level Labs: No results found for: LITHIUM No results found for: VALPROATE No components found for:  CBMZ  Current Medications: Current Outpatient Medications  Medication Sig Dispense Refill  . lamoTRIgine (LAMICTAL) 25 MG tablet Take 1 tablet (25 mg total) by mouth daily. 30 tablet 0  . risperiDONE (RISPERDAL) 3 MG tablet Take 1 tablet (3 mg total) by mouth at bedtime. (Patient not taking: No sig reported) 30 tablet 1  . sertraline (ZOLOFT) 50 MG tablet Take 1 tablet (50 mg total) by mouth daily. 30 tablet 1  . traZODone (DESYREL) 50 MG tablet Take 1 tablet (50 mg total) by mouth at bedtime. 30 tablet 1   No current facility-administered medications for this visit.     Musculoskeletal: Strength & Muscle Tone: within normal limits Gait & Station: normal Patient leans: N/A  Psychiatric Specialty Exam: Review of Systems  Psychiatric/Behavioral: Positive for decreased concentration, dysphoric mood and sleep disturbance. Negative for agitation, hallucinations, self-injury and suicidal ideas. The patient is nervous/anxious. The patient is not hyperactive.     Blood pressure 125/88, pulse 72, height 5\' 11"  (1.803 m), weight 147 lb 12.8 oz (67 kg), SpO2 100 %.Body mass index is 20.61 kg/m.  General Appearance: Well Groomed  Eye Contact:  Good  Speech:  Clear  and Coherent and Normal Rate  Volume:  Normal  Mood:  Anxious, Depressed and Dysphoric  Affect:  Congruent and Depressed  Thought Process:  Coherent, Goal Directed and Descriptions of Associations: Intact  Orientation:  Full (Time, Place, and Person)  Thought Content: WDL and Logical   Suicidal Thoughts:  No  Homicidal Thoughts:  No  Memory:  Immediate;   Good Recent;   Good Remote;   Good  Judgement:  Good  Insight:  Good  Psychomotor Activity:  Normal  Concentration:  Concentration: Good and Attention Span: Good  Recall:  Good  Fund of Knowledge: Good  Language: Good  Akathisia:  NA  Handed:  Right  AIMS (if indicated): not done  Assets:  Communication Skills Desire for Improvement Housing Physical Health Resilience Social Support  ADL's:  Intact  Cognition: WNL  Sleep:  Poor   Screenings: GAD-7   Office Visit from 11/07/2020 in [facility]  Visit from 10/26/2020 in [facility]  Total GAD-7 Score 19 19    PHQ2-9   Flowsheet Row Office Visit from 11/07/2020 in [facility] Office Visit from 10/26/2020 in [facility] ED from 09/19/2020 in [facility] ED from 06/28/2020 in [facility]  PHQ-2 Total Score 6 6 6 1   PHQ-9 Total Score 26 25 23  -    Flowsheet Row Office Visit from 11/07/2020 in [facility] Office Visit from 10/26/2020 in [facility] ED from 09/19/2020 in [facility]  C-SSRS RISK CATEGORY Low Risk Low Risk Low Risk       Assessment and Plan:   Mackson "[facility]" Wambold is a 45 year old male with a past psychiatric history significant for major depressive disorder, generalized anxiety disorder, panic disorder, and insomnia who presents to [facility] for  follow-up and medication management.  Patient expresses that his current regimen of medications have not been helpful with the management of his current symptoms.  Patient still endorses anxiety as well as the following symptoms: shortness of breath, dizziness, fainting, and chest tightness.  Patient denies side effects from his medications.  Patient was recommended increasing his dosage of sertraline from 50 mg to 100 mg for the management of his anxiety, depression, and panic disorder.  Patient was also recommended to increase his trazodone from 50 mg to 100 mg for the management of his sleep.  Patient was also suggested adding hydroxyzine 10 mg three times daily as needed for the management of his anxiety.  Patient was agreeable to recommendation.  Patient's medications will be e-prescribed to pharmacy of choice.  Patient to be set up with a licensed clinical social worker upon conclusion of the encounter.  1. Insomnia, unspecified type  - traZODone (DESYREL) 100 MG tablet; Take 1 tablet (100 mg total) by mouth at bedtime.  Dispense: 30 tablet; Refill: 1  2. Severe episode of recurrent major depressive disorder, without psychotic features (HCC)  - sertraline (ZOLOFT) 100 MG tablet; Take 1 tablet (100 mg total) by mouth daily.  Dispense: 30 tablet; Refill: 1  3. Generalized anxiety disorder  - sertraline (ZOLOFT) 100 MG tablet; Take 1 tablet (100 mg total) by mouth daily.  Dispense: 30 tablet; Refill: 1 - hydrOXYzine (ATARAX/VISTARIL) 10 MG tablet; Take 1 tablet (10 mg total) by mouth 3 (three) times daily as needed.  Dispense: 75 tablet; Refill: 1  4. Panic disorder  - sertraline (ZOLOFT) 100 MG tablet; Take 1 tablet (100 mg total) by mouth daily.  Dispense: 30 tablet; Refill: 1  Patient to follow up in 4 weeks  54, PA 11/07/2020, 12:47 PM

## 2020-11-09 ENCOUNTER — Telehealth (HOSPITAL_COMMUNITY): Payer: Self-pay | Admitting: *Deleted

## 2020-11-09 ENCOUNTER — Other Ambulatory Visit (HOSPITAL_COMMUNITY): Payer: Self-pay | Admitting: Physician Assistant

## 2020-11-09 DIAGNOSIS — F250 Schizoaffective disorder, bipolar type: Secondary | ICD-10-CM

## 2020-11-09 MED ORDER — RISPERIDONE 2 MG PO TABS: 2.0000 mg | ORAL_TABLET | Freq: Every day | ORAL | 1 refills | Status: DC

## 2020-11-09 MED ORDER — RISPERIDONE 1 MG PO TABS: 1.0000 mg | ORAL_TABLET | Freq: Every day | ORAL | 1 refills | Status: DC

## 2020-11-09 MED FILL — risperiDONE 2 MG TABS: 2 | 30 days supply | Qty: 30 | Fill #0

## 2020-11-09 MED FILL — risperiDONE 1 MG TABS: 1 | 30 days supply | Qty: 30 | Fill #0

## 2020-11-09 NOTE — Telephone Encounter (Signed)
Patient called wanting to speak with his provider and ask if he can take risperidone tonight because he is reporting A/H and racing thoughts and the hydroxyzine didn't help. Will forward his concern to [facility] PA.

## 2020-11-09 NOTE — Telephone Encounter (Signed)
Provider was contacted by Wynona Luna, RN regarding patient's medications. Provider was informed that patient has a limited amount of risperidone pills. Patient's risperidone will be e-prescribed to pharmacy of choice.

## 2020-11-09 NOTE — Telephone Encounter (Signed)
Provider was contacted by Wynona Luna, regarding patient's complaints or racing thoughts and auditory hallucinations. Patient would like to be placed back on risperidone. Provider informed Nurse to contact patient to allow the use of risperidone for the management of his auditory hallucinations.

## 2020-11-09 NOTE — Telephone Encounter (Signed)
Spoke with Sean Back PA re Sean Shepherd wanting to revisit his risperidone. Sean Shepherd agreed for Sean Shepherd to restart it at the dose Sean Shepherd was taking before which is 1 mg in am and 2 mg in HS. Sean Shepherd states Sean Shepherd only has about 5 pills so will ask Sean Shepherd to call rx in for Sean Shepherd. Spoke with Sean Shepherd for today to break a 2 mg pill and take his first dose of 1 mg now and then take a 2 mg pill at bedtime and pick up new rx tomorrow for the risperidone. Sean Shepherd verbalized his understanding. To call as needed, Sean Shepherd does have an appt with Sean Shepherd on 4/4.

## 2020-11-09 NOTE — Progress Notes (Signed)
Provider was contacted by Sean K Beck, RN regarding patient's medications. Provider was informed that patient has a limited amount of risperidone pills. Patient's risperidone will be e-prescribed to pharmacy of choice.

## 2020-11-18 ENCOUNTER — Other Ambulatory Visit: Payer: Self-pay

## 2020-11-18 ENCOUNTER — Ambulatory Visit (HOSPITAL_COMMUNITY)
Admission: EM | Admit: 2020-11-18 | Discharge: 2020-11-20 | Disposition: A | Discharge status: Home / Self Care | Payer: No Payment, Other | Attending: Psychiatry | Admitting: Psychiatry

## 2020-11-18 DIAGNOSIS — Z20822 Contact with and (suspected) exposure to covid-19: Secondary | ICD-10-CM | POA: Insufficient documentation

## 2020-11-18 DIAGNOSIS — F323 Major depressive disorder, single episode, severe with psychotic features: Secondary | ICD-10-CM | POA: Diagnosis not present

## 2020-11-18 DIAGNOSIS — F410 Panic disorder [episodic paroxysmal anxiety] without agoraphobia: Secondary | ICD-10-CM | POA: Insufficient documentation

## 2020-11-18 DIAGNOSIS — F203 Undifferentiated schizophrenia: Secondary | ICD-10-CM | POA: Diagnosis present

## 2020-11-18 DIAGNOSIS — F250 Schizoaffective disorder, bipolar type: Secondary | ICD-10-CM

## 2020-11-18 DIAGNOSIS — F411 Generalized anxiety disorder: Secondary | ICD-10-CM | POA: Diagnosis present

## 2020-11-18 LAB — POCT URINE DRUG SCREEN - MANUAL ENTRY (I-SCREEN)
POC Amphetamine UR: NOT DETECTED
POC Buprenorphine (BUP): NOT DETECTED
POC Cocaine UR: NOT DETECTED
POC Marijuana UR: NOT DETECTED
POC Methadone UR: NOT DETECTED
POC Methamphetamine UR: NOT DETECTED
POC Morphine: NOT DETECTED
POC Oxazepam (BZO): NOT DETECTED
POC Oxycodone UR: NOT DETECTED
POC Secobarbital (BAR): NOT DETECTED

## 2020-11-18 LAB — COMPREHENSIVE METABOLIC PANEL
ALT: 20 U/L (ref 0–44)
AST: 23 U/L (ref 15–41)
Albumin: 4.5 g/dL (ref 3.5–5.0)
Alkaline Phosphatase: 61 U/L (ref 38–126)
Anion gap: 6 (ref 5–15)
BUN: 9 mg/dL (ref 6–20)
CO2: 32 mmol/L (ref 22–32)
Calcium: 9.4 mg/dL (ref 8.9–10.3)
Chloride: 100 mmol/L (ref 98–111)
Creatinine, Ser: 0.83 mg/dL (ref 0.61–1.24)
GFR, Estimated: >60 mL/min (ref >60)
Glucose, Bld: 71 mg/dL (ref 70–99)
Potassium: 4.6 mmol/L (ref 3.5–5.1)
Sodium: 138 mmol/L (ref 135–145)
Total Bilirubin: 1.7 mg/dL — ABNORMAL HIGH (ref 0.3–1.2)
Total Protein: 6.8 g/dL (ref 6.5–8.1)

## 2020-11-18 LAB — CBC WITH DIFFERENTIAL/PLATELET
Abs Immature Granulocytes: 0.03 10*3/uL (ref 0.00–0.07)
Basophils Absolute: 0.1 10*3/uL (ref 0.0–0.1)
Basophils Relative: 1 %
Eosinophils Absolute: 0 10*3/uL (ref 0.0–0.5)
Eosinophils Relative: 0 %
HCT: 44.7 % (ref 39.0–52.0)
Hemoglobin: 14.8 g/dL (ref 13.0–17.0)
Immature Granulocytes: 0 %
Lymphocytes Relative: 12 %
Lymphs Abs: 1.2 10*3/uL (ref 0.7–4.0)
MCH: 30.1 pg (ref 26.0–34.0)
MCHC: 33.1 g/dL (ref 30.0–36.0)
MCV: 91 fL (ref 80.0–100.0)
Monocytes Absolute: 0.8 10*3/uL (ref 0.1–1.0)
Monocytes Relative: 8 %
Neutro Abs: 8.1 10*3/uL — ABNORMAL HIGH (ref 1.7–7.7)
Neutrophils Relative %: 79 %
Platelets: 281 10*3/uL (ref 150–400)
RBC: 4.91 MIL/uL (ref 4.22–5.81)
RDW: 12.3 % (ref 11.5–15.5)
WBC: 10.3 10*3/uL (ref 4.0–10.5)
nRBC: 0 % (ref 0.0–0.2)

## 2020-11-18 LAB — LIPID PANEL
Cholesterol: 188 mg/dL (ref 0–200)
HDL: 69 mg/dL (ref >40)
LDL Cholesterol: 94 mg/dL (ref 0–99)
Total CHOL/HDL Ratio: 2.7 RATIO
Triglycerides: 124 mg/dL (ref <150)
VLDL: 25 mg/dL (ref 0–40)

## 2020-11-18 LAB — RESP PANEL BY RT-PCR (FLU A&B, COVID) ARPGX2
Influenza A by PCR: NEGATIVE
Influenza B by PCR: NEGATIVE
SARS Coronavirus 2 by RT PCR: NEGATIVE

## 2020-11-18 LAB — POC SARS CORONAVIRUS 2 AG -  ED: SARS Coronavirus 2 Ag: NEGATIVE

## 2020-11-18 LAB — POC SARS CORONAVIRUS 2 AG: SARS Coronavirus 2 Ag: NEGATIVE

## 2020-11-18 LAB — TSH: TSH: 0.817 u[IU]/mL (ref 0.350–4.500)

## 2020-11-18 LAB — ETHANOL: Alcohol, Ethyl (B): <10 mg/dL (ref <10)

## 2020-11-18 MED ORDER — TRAZODONE HCL 50 MG PO TABS: 50.0000 mg | ORAL_TABLET | Freq: Every evening | ORAL | Status: DC | PRN

## 2020-11-18 MED ORDER — HYDROXYZINE HCL 25 MG PO TABS: 25.0000 mg | ORAL_TABLET | Freq: Three times a day (TID) | ORAL | Status: DC | PRN

## 2020-11-18 MED ORDER — GABAPENTIN 300 MG PO CAPS
300.0000 mg | ORAL_CAPSULE | Freq: Three times a day (TID) | ORAL | Status: DC
  Administered 2020-11-19 – 2020-11-20 (×4): 300 mg via ORAL
  Filled 2020-11-18 (×2): qty 1
  Filled 2020-11-18: qty 21
  Filled 2020-11-18 (×2): qty 1

## 2020-11-18 NOTE — ED Notes (Addendum)
Pt admitted to continuous assessment endorsing increased anxiety and paranoid behaviors that others are after him. Endorsed intermittent AVH. Pt reassured of his safety on the unit. Denies SI/HI. Oriented to unit and unit rules. Will monitor for safety.

## 2020-11-18 NOTE — ED Notes (Signed)
Pt asleep in bed. Respirations even and unlabored. Will continue to monitor for safety. ?

## 2020-11-18 NOTE — BH Assessment (Signed)
TTS triage: Patient presents to [facility] reporting increased anxiety and panic. Patient states he is unable to sleep more than 1 hour at a time and when he does he wakes up with SOB and a sense of "impending doom." Patient expresses anxiety about numerous physical complaints, despite being cleared by a physician. He states he has lost 20 lbs in the past couple months. He states "I'm not functioning." He endorses AVH. He also reports passive SI without a specific plan but states these thoughts are "intrusive."  Patient is URGENT

## 2020-11-18 NOTE — ED Notes (Signed)
Pt sleeping at present, no distress noted,  Monitoring for safety. 

## 2020-11-18 NOTE — ED Notes (Signed)
Pt given meal

## 2020-11-18 NOTE — ED Notes (Signed)
Pt sitting up at bedside, very paranoid, refused meds offered for anxiety.

## 2020-11-18 NOTE — ED Provider Notes (Addendum)
Behavioral Health Admission H&P [facility] & OBS)  Date: 11/18/20 Patient Name: Sean Shepherd MRN: [id number] Chief Complaint: No chief complaint on file.  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 Patient is seen routinely by writer in an outpatient setting at [facility]. Patient has a past history of being managed for schizoaffective disorder. Patient was originally managed on risperidone which was eventually discontinued on the request patient due to believing his psychotic symptoms were due to past marijuana/THC use. Patient contacted provider on 11/09/2020 with the request to be placed back on risperidone and a script was sent to his pharmacy of choice. Patient reports that he has yet to start his risperidone.  Patient reports that he is in a constant state of panic. Patient reports that he has been having issues with shortness of breath and has been unable to get in full breaths all day. Patient states that for the last 3 - 4 nights he has been jolted awake accompanied by a sense of impending doom. Patient describes this feeling as if it were his last breath. Patient states that he is often close to calling 911. Patient reports that he can be laying calmly in bed but still experience difficulty breathing.  Patient reports that he is unable to function due to his panicking. Patient believes that he has cancer due to some of the symptoms he has been experiencing. Patient reports that he has lost 20 pounds over the last weeks and believes that he  has lost at least 5 pounds over the past week alone. Patient reports having no bowel movement for weeks on end. Patient endorses difficulty swallowing and he feels that his anxiety over his health concerns has aged more than 10 years. Patient states the he was recently evaluated at [facility] for his difficulty breathing with no significant findings regarding his breathing and lung sounds.  Patient is alert and oriented x4. Patient is anxious yet cooperative and fully engaged during the encounter. Patient denies active suicide ideations but states that he is bombarded with intrusive thoughts related to thoughts that he is dying. Patient denies homicide ideations. Patient denies active auditory or visual hallucinations but states that he has experienced hallucinations over the past few days. Patient states that he has experienced auditory hallucinations characterized by sounds being translated into voices. Patient states that last night he experienced a visual hallucination characterized by objects morphing into things.   Patient endorses fair appetite and eats on average 1 - 2 meals per day with snacking. Patient expresses that his parent's believe that his weight loss is due to lack of eating, but patient endorses that he eats regularly. Patient endorses poor sleep and receives on average 1 - 2 hours of sleep each night. Patient's sleep is disturbed by horrendous nightmares. Patient denies alcohol consumption, tobacco use, and illicit drug use. Patient has a past history of marijuana/THC concentrate use. Patient does not feel like a danger to himself but is unable to definitively contract for safety.   PHQ 2-9:  [employer] Visit from 11/07/2020 in [facility] Office Visit from  10/26/2020 in [facility] ED from 09/19/2020 in [facility]  Thoughts that you would be better off dead, or of hurting yourself  in some way Nearly every day More than half the days Nearly every day  [Phreesia 09/19/2020]  PHQ-9 Total Score 26 25 23       Flowsheet Row ED from 11/18/2020 in [facility] Office Visit from 11/07/2020 in [facility] Office Visit from 10/26/2020 in [facility]  C-SSRS RISK CATEGORY Error: Q3, 4, or 5 should not be populated when Q2 is No Low Risk Low Risk       Total Time spent with patient: 20 minutes  Musculoskeletal  Strength & Muscle Tone: within normal limits Gait & Station: normal Patient leans: N/A  Psychiatric Specialty Exam  Presentation General Appearance: Appropriate for Environment; Well Groomed  Eye Contact:Good  Speech:Clear and Coherent; Normal Rate  Speech Volume:Normal  Handedness:Right   Mood and Affect  Mood:Dysphoric; Anxious  Affect:Depressed   Thought Process  Thought Processes:Coherent  Descriptions of Associations:Intact  Orientation:Full (Time, Place and Person)  Thought Content:Rumination; Delusions  Diagnosis of Schizophrenia or Schizoaffective disorder in past: Yes  Duration of Psychotic Symptoms: Less than six months  Hallucinations:Hallucinations: Auditory; Visual Description of Auditory Hallucinations: Patient endorses auditory hallucinations characterized by sounds becoming voices Description of Visual Hallucinations: Patient endorses visual hallucinations characterized by things morphing into things  Ideas of Reference:Delusions  Suicidal Thoughts:Suicidal Thoughts: No (Patient reports intrusive thoughts characterized by thoughts that he is dying)  Homicidal Thoughts:Homicidal Thoughts: No   Sensorium  Memory:Immediate Good; Recent Good; Remote Good  Judgment:Fair  Insight:Fair   Executive Functions  Concentration:Good  Attention Span:Good  Recall:Good  Fund of Knowledge:Good  Language:Good   Psychomotor Activity   Psychomotor Activity:Psychomotor Activity: Normal   Assets  Assets:Communication Skills; Desire for Improvement; Physical Health; Social Support   Sleep  Sleep:Sleep: Poor   Nutritional Assessment (For OBS and FBC admissions only) Has the patient had a weight loss or gain of 10 pounds or more in the last 3 months?: Yes (Patient reports that he has lost 20 pounds over several weeks) Has the patient had a decrease in food intake/or appetite?: No Does the patient have dental problems?: No Does the patient have eating habits or behaviors that may be indicators of an eating disorder including binging or inducing vomiting?: No Has the patient recently lost weight without trying?: Yes, 14-23 lbs. Has the patient been eating poorly because of a decreased appetite?: No Malnutrition Screening Tool Score: 2 Nutritional Assessment Referrals: Medication/Tx changes    Physical Exam ROS  Blood pressure (!) 134/91, pulse 74, temperature 97.8 F (36.6 C), temperature source Temporal, resp. rate 16, SpO2 97 %. There is no height or weight on file to calculate BMI.  Past Psychiatric History:  Major depressive disorder Generalized anxiety disorder Panic disorder Past marijuana use Past diagnosis of schizoaffective disorder (bipolar type)   Is the patient at risk to self? No  Has the patient been a risk to self in the past 6 months? Yes .    Has the patient been a risk to self within the distant past? Yes   Is the patient a risk to others? No   Has the patient been a risk to others in the past 6 months? No   Has the patient been a risk to others within the distant past? No   Past Medical History:  Past Medical  History:  Diagnosis Date   Bipolar disorder (HCC)    Depression    Schizoaffective disorder (HCC)    No past surgical history on file.  Family History: No family history on file.  Social History:  Social History   Socioeconomic History   Marital status: Single     Spouse name: Not on file   Number of children: 0   Years of education: Not on file   Highest education level: Not on file  Occupational History   Not on file  Tobacco Use   Smoking status: Former Smoker    Quit date: 07/31/2014    Years since quitting: 6.3   Smokeless tobacco: Never Used  Vaping Use   Vaping Use: Every day   Substances: THC, CBD  Substance and Sexual Activity   Alcohol use: Not Currently   Drug use: Yes    Types: Marijuana   Sexual activity: Never  Other Topics Concern   Not on file  Social History Narrative   Not on file   Social Determinants of Health   Financial Resource Strain: Not on file  Food Insecurity: Not on file  Transportation Needs: Not on file  Physical Activity: Not on file  Stress: Not on file  Social Connections: Not on file  Intimate Partner Violence: Not on file    SDOH:  SDOH Screenings   Alcohol Screen: Not on file  Depression (PHQ2-9): Medium Risk   PHQ-2 Score: 26  Financial Resource Strain: Not on file  Food Insecurity: Not on file  Housing: Not on file  Physical Activity: Not on file  Social Connections: Not on file  Stress: Not on file  Tobacco Use: Medium Risk   Smoking Tobacco Use: Former Smoker   Smokeless Tobacco Use: Never Used  Transportation Needs: Not on file    Last Labs:  Admission on 11/18/2020  Component Date Value Ref Range Status   POC Amphetamine UR 11/18/2020 None Detected  NONE DETECTED (Cut Off Level 1000 ng/mL) Final   POC Secobarbital (BAR) 11/18/2020 None Detected  NONE DETECTED (Cut Off Level 300 ng/mL) Final   POC Buprenorphine (BUP) 11/18/2020 None Detected  NONE DETECTED (Cut Off Level 10 ng/mL) Final   POC Oxazepam (BZO) 11/18/2020 None Detected  NONE DETECTED (Cut Off Level 300 ng/mL) Final   POC Cocaine UR 11/18/2020 None Detected  NONE DETECTED (Cut Off Level 300 ng/mL) Final   POC Methamphetamine UR 11/18/2020 None Detected  NONE DETECTED (Cut Off Level 1000  ng/mL) Final   POC Morphine 11/18/2020 None Detected  NONE DETECTED (Cut Off Level 300 ng/mL) Final   POC Oxycodone UR 11/18/2020 None Detected  NONE DETECTED (Cut Off Level 100 ng/mL) Final   POC Methadone UR 11/18/2020 None Detected  NONE DETECTED (Cut Off Level 300 ng/mL) Final   POC Marijuana UR 11/18/2020 None Detected  NONE DETECTED (Cut Off Level 50 ng/mL) Final   SARS Coronavirus 2 Ag 11/18/2020 Negative  Negative Final   SARS Coronavirus 2 Ag 11/18/2020 NEGATIVE  NEGATIVE Final   Comment: (NOTE) SARS-CoV-2 antigen NOT DETECTED.   Negative results are presumptive.  Negative results do not preclude SARS-CoV-2 infection and should not be used as the sole basis for treatment or other patient management decisions, including infection  control decisions, particularly in the presence of clinical signs and  symptoms consistent with COVID-19, or in those who have been in contact with the virus.  Negative results must be combined with clinical observations, patient history, and epidemiological information. The  expected result is Negative.  Fact Sheet for Patients: https://www.jennings-kim.com/  Fact Sheet for Healthcare Providers: https://alexander-rogers.biz/  This test is not yet approved or cleared by the [place] FDA and  has been authorized for detection and/or diagnosis of SARS-CoV-2 by FDA under an Emergency Use Authorization (EUA).  This EUA will remain in effect (meaning this test can be used) for the duration of  the COV                          ID-19 declaration under Section 564(b)(1) of the Act, 21 U.S.C. section 360bbb-3(b)(1), unless the authorization is terminated or revoked sooner.    Admission on 06/28/2020, Discharged on 06/29/2020  Component Date Value Ref Range Status   SARS Coronavirus 2 by RT PCR 06/28/2020 NEGATIVE  NEGATIVE Final   Comment: (NOTE) SARS-CoV-2 target nucleic acids are NOT DETECTED.  The SARS-CoV-2 RNA  is generally detectable in upper respiratoy specimens during the acute phase of infection. The lowest concentration of SARS-CoV-2 viral copies this assay can detect is 131 copies/mL. A negative result does not preclude SARS-Cov-2 infection and should not be used as the sole basis for treatment or other patient management decisions. A negative result may occur with  improper specimen collection/handling, submission of specimen other than nasopharyngeal swab, presence of viral mutation(s) within the areas targeted by this assay, and inadequate number of viral copies (<131 copies/mL). A negative result must be combined with clinical observations, patient history, and epidemiological information. The expected result is Negative.  Fact Sheet for Patients:  https://www.moore.com/  Fact Sheet for Healthcare Providers:  https://www.young.biz/  This test is no                          t yet approved or cleared by the [place] FDA and  has been authorized for detection and/or diagnosis of SARS-CoV-2 by FDA under an Emergency Use Authorization (EUA). This EUA will remain  in effect (meaning this test can be used) for the duration of the COVID-19 declaration under Section 564(b)(1) of the Act, 21 U.S.C. section 360bbb-3(b)(1), unless the authorization is terminated or revoked sooner.     Influenza A by PCR 06/28/2020 NEGATIVE  NEGATIVE Final   Influenza B by PCR 06/28/2020 NEGATIVE  NEGATIVE Final   Comment: (NOTE) The Xpert Xpress SARS-CoV-2/FLU/RSV assay is intended as an aid in  the diagnosis of influenza from Nasopharyngeal swab specimens and  should not be used as a sole basis for treatment. Nasal washings and  aspirates are unacceptable for Xpert Xpress SARS-CoV-2/FLU/RSV  testing.  Fact Sheet for Patients: https://www.moore.com/  Fact Sheet for Healthcare Providers: https://www.young.biz/  This  test is not yet approved or cleared by the [place] FDA and  has been authorized for detection and/or diagnosis of SARS-CoV-2 by  FDA under an Emergency Use Authorization (EUA). This EUA will remain  in effect (meaning this test can be used) for the duration of the  Covid-19 declaration under Section 564(b)(1) of the Act, 21  U.S.C. section 360bbb-3(b)(1), unless the authorization is  terminated or revoked. Performed at [facility], [address], [place], [place] [address]    WBC 06/28/2020 9.1  4.0 - 10.5 K/uL Final   RBC 06/28/2020 5.26  4.22 - 5.81 MIL/uL Final   Hemoglobin 06/28/2020 15.7  13.0 - 17.0 g/dL Final   HCT 19/14/7829 47.9  39.0 - 52.0 % Final   MCV  06/28/2020 91.1  80.0 - 100.0 fL Final   MCH 06/28/2020 29.8  26.0 - 34.0 pg Final   MCHC 06/28/2020 32.8  30.0 - 36.0 g/dL Final   RDW 14/78/2956 11.9  11.5 - 15.5 % Final   Platelets 06/28/2020 258  150 - 400 K/uL Final   nRBC 06/28/2020 0.0  0.0 - 0.2 % Final   Neutrophils Relative % 06/28/2020 78  % Final   Neutro Abs 06/28/2020 7.0  1.7 - 7.7 K/uL Final   Lymphocytes Relative 06/28/2020 16  % Final   Lymphs Abs 06/28/2020 1.5  0.7 - 4.0 K/uL Final   Monocytes Relative 06/28/2020 6  % Final   Monocytes Absolute 06/28/2020 0.6  0.1 - 1.0 K/uL Final   Eosinophils Relative 06/28/2020 0  % Final   Eosinophils Absolute 06/28/2020 0.0  0.0 - 0.5 K/uL Final   Basophils Relative 06/28/2020 0  % Final   Basophils Absolute 06/28/2020 0.0  0.0 - 0.1 K/uL Final   Immature Granulocytes 06/28/2020 0  % Final   Abs Immature Granulocytes 06/28/2020 0.02  0.00 - 0.07 K/uL Final   Performed at [facility], [address], [place], [place] [address]   Sodium 06/28/2020 141  135 - 145 mmol/L Final   Potassium 06/28/2020 4.3  3.5 - 5.1 mmol/L Final   Chloride 06/28/2020 100  98 - 111 mmol/L Final   CO2 06/28/2020 28  22 - 32 mmol/L Final   Glucose, Bld 06/28/2020 74  70 - 99 mg/dL Final    Glucose reference range applies only to samples taken after fasting for at least 8 hours.   BUN 06/28/2020 10  6 - 20 mg/dL Final   Creatinine, Ser 06/28/2020 0.75  0.61 - 1.24 mg/dL Final   Calcium 65/78/4696 10.0  8.9 - 10.3 mg/dL Final   Total Protein 29/52/8413 7.3  6.5 - 8.1 g/dL Final   Albumin 24/40/1027 4.7  3.5 - 5.0 g/dL Final   AST 25/36/6440 16  15 - 41 U/L Final   ALT 06/28/2020 14  0 - 44 U/L Final   Alkaline Phosphatase 06/28/2020 64  38 - 126 U/L Final   Total Bilirubin 06/28/2020 3.2* 0.3 - 1.2 mg/dL Final   GFR, Estimated 06/28/2020 >60  >60 mL/min Final   Comment: (NOTE) Calculated using the CKD-EPI Creatinine Equation (2021)    Anion gap 06/28/2020 13  5 - 15 Final   Performed at [facility], [address], [place], [place] [address]   Hgb A1c MFr Bld 06/28/2020 5.7* 4.8 - 5.6 % Final   Comment: (NOTE)         Prediabetes: 5.7 - 6.4         Diabetes: >6.4         Glycemic control for adults with diabetes: <7.0    Mean Plasma Glucose 06/28/2020 117  mg/dL Final   Comment: (NOTE) Performed At: [facility] [address] [place], [place] [id number] Jolene Schimke MD [id number]    Cholesterol 06/28/2020 197  0 - 200 mg/dL Final   Triglycerides 60/63/0160 49  <150 mg/dL Final   HDL 10/93/2355 59  >40 mg/dL Final   Total CHOL/HDL Ratio 06/28/2020 3.3  RATIO Final   VLDL 06/28/2020 10  0 - 40 mg/dL Final   LDL Cholesterol 06/28/2020 128* 0 - 99 mg/dL Final   Comment:        Total Cholesterol/HDL:CHD Risk Coronary Heart Disease Risk Table  Men   Women  1/2 Average Risk   3.4   3.3  Average Risk       5.0   4.4  2 X Average Risk   9.6   7.1  3 X Average Risk  23.4   11.0        Use the calculated Patient Ratio above and the CHD Risk Table to determine the patient's CHD Risk.        ATP III CLASSIFICATION (LDL):  <100     mg/dL   Optimal  921-194  mg/dL   Near or Above                    Optimal   130-159  mg/dL   Borderline  174-081  mg/dL   High  >448     mg/dL   Very High Performed at [facility], [address], [place], [place] [address]    TSH 06/28/2020 1.452  0.350 - 4.500 uIU/mL Final   Comment: Performed by a 3rd Generation assay with a functional sensitivity of <=0.01 uIU/mL. Performed at [facility], [address], [place], [place] [address]    POC Amphetamine UR 06/28/2020 None Detected  None Detected Final   POC Secobarbital (BAR) 06/28/2020 None Detected  None Detected Final   POC Buprenorphine (BUP) 06/28/2020 None Detected  None Detected Final   POC Oxazepam (BZO) 06/28/2020 None Detected  None Detected Final   POC Cocaine UR 06/28/2020 None Detected  None Detected Final   POC Methamphetamine UR 06/28/2020 None Detected  None Detected Final   POC Morphine 06/28/2020 None Detected  None Detected Final   POC Oxycodone UR 06/28/2020 None Detected  None Detected Final   POC Methadone UR 06/28/2020 None Detected  None Detected Final   POC Marijuana UR 06/28/2020 Positive* None Detected Final  Office Visit on 06/28/2020  Component Date Value Ref Range Status   SARS Coronavirus 2 Ag 06/28/2020 NEGATIVE  NEGATIVE Final   Comment: (NOTE) SARS-CoV-2 antigen NOT DETECTED.   Negative results are presumptive.  Negative results do not preclude SARS-CoV-2 infection and should not be used as the sole basis for treatment or other patient management decisions, including infection  control decisions, particularly in the presence of clinical signs and  symptoms consistent with COVID-19, or in those who have been in contact with the virus.  Negative results must be combined with clinical observations, patient history, and epidemiological information. The expected result is Negative.  Fact Sheet for Patients: https://sanders-williams.net/  Fact Sheet for Healthcare Providers: https://martinez.com/   This test is not  yet approved or cleared by the [place] FDA and  has been authorized for detection and/or diagnosis of SARS-CoV-2 by FDA under an Emergency Use Authorization (EUA).  This EUA will remain in effect (meaning this test can be used) for the duration of  the C                          OVID-19 declaration under Section 564(b)(1) of the Act, 21 U.S.C. section 360bbb-3(b)(1), unless the authorization is terminated or revoked sooner.      Allergies: Patient has no known allergies.  PTA Medications: (Not in a hospital admission)   Medical Decision Making  Based on my evaluation patient meets criteria for admission to [facility] for continuous observation. Although patient does not appear to be an immediate danger to himself, he is unable to  definitively contract for safety. Admission labs have been ordered and initiated. Provider to order gabapentin 300 mg 3 x daily for the management of his anxiety.  Recommendations  Based on my evaluation the patient does not appear to have an emergency medical condition. [facility] and will be reassessed in the morning. Patient to have follow-up psychiatric outpatient [facility] with his psychiatric PA Stephens Shire(Miarose Lippert Jackelyn KnifeEdwin Isahi Godwin).  Meta HatchetUchenna E Humaira Sculley, PA 11/18/20  5:44 PM
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
## 2020-11-19 MED ORDER — RISPERIDONE 2 MG PO TABS
2.0000 mg | ORAL_TABLET | Freq: Every day | ORAL | Status: DC
  Administered 2020-11-19: 2 mg via ORAL
  Filled 2020-11-19: qty 1

## 2020-11-19 MED ORDER — TRAZODONE HCL 100 MG PO TABS
100.0000 mg | ORAL_TABLET | Freq: Every day | ORAL | Status: DC
  Administered 2020-11-19: 100 mg via ORAL
  Filled 2020-11-19: qty 7
  Filled 2020-11-19: qty 1

## 2020-11-19 MED ORDER — RISPERIDONE 1 MG PO TABS
1.0000 mg | ORAL_TABLET | Freq: Every day | ORAL | Status: DC
  Administered 2020-11-19 – 2020-11-20 (×2): 1 mg via ORAL
  Filled 2020-11-19: qty 1
  Filled 2020-11-19: qty 21
  Filled 2020-11-19: qty 1

## 2020-11-19 MED ORDER — HYDROXYZINE HCL 25 MG PO TABS
25.0000 mg | ORAL_TABLET | Freq: Three times a day (TID) | ORAL | Status: DC | PRN
  Filled 2020-11-19: qty 10

## 2020-11-19 NOTE — ED Notes (Signed)
Pt sleeping@this time. Breathing even and unlabored. Will continue to monitor for safety 

## 2020-11-19 NOTE — ED Notes (Signed)
Pt given sandwich and cheese its.

## 2020-11-19 NOTE — ED Provider Notes (Signed)
Behavioral Health Progress Note  Date and Time: 11/19/2020 11:01 AM Name: Sean Shepherd MRN:  [id number]  Subjective:   Patient presents on assessment alert, oriented, and initially calm; as assessment progressed patient became increasingly anxious. Provider inquired about patient taking medications, patient notified provider he refused his medications because he didn't feel he needed them". Provider discussed medication compliance to address symptoms and patient replied okay; affect heightened. Patient denies any suicidal or homicidal ideations, auditory or visual hallucinations. Patient then stated to provider he feels he "needs to go to [facility]". Provider inquired why,  patient then responded, "because I've been having trouble breathing all night".  provided noted equal rise and fall of chest, even non-labored breathing. Patient's vital signs stable; o2 sat 100% on room air. Provider discussed taking medication, patient agreed; provider had patient's vital signs retaken. While RN was attempting to give medication, patient began refusing provider attempted to process with patient medication compliance for symptom management. Patient did take medication. Patient gave provider permission to contact his mother for collateral information and safety planning.   Collateral: Sean Shepherd (mom) [phone] Mom states she drove patient to [facility] yesterday because the last month patient has not been sleeping per his report to mom. Patient lives alone. Mom states she sent a good morning message to patient and didn't get a response so she decided to walk to patient's home where she found him slumped over requesting "to go get help". Feels patient "struggles making decisions, even the simplest decisions like what to eat or wear". Mom states current state is "very strange for him. It's gone to a much more significant level where he can't function. He thinks that he has a major physical malady and it changes as  to what the problem is. He says its his lungs and he can't get enough oxygen, then he says it's his heart. He's says he's afraid he is dying. 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 He learned how to fly a plane and then all of a sudden here we are and I don't know how best to help him anymore. No matter what condition he's in I love him but I don't know what the right thing is because it's happened so fast. It was uncontrolled use of cannibis concentrate and that took place over a period of maybe 3 months; at first he was feeling absolutely wonderful. He had a psychotic break about a year ago, it started about March and hyped up around 02/2020. He thought he had discovered the origins of the universe, he was decoding sounds that he heard into messages; what birds were calling he thought they were directed towards him. He felt inflated, a sense of urgency like he could solve every problem in the world. He lost his source of the cannibis products he was using and he got panicky. That use is what I think bought him to his breaking point. He's convinced he's got permanent brain damage. Risperdal helped him begin to realize all the dreams and delusions were just that. Not sure about the Zoloft. He says he hasn't been sleeping because he has to manually control his breathing". She reports patient is not eating, not sleeping, unsure if patient is taking his medications, patient is currently living alone, and expressing delusional, paranoid thought content.   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 Bipolar disorder (HCC)   . Depression   . Schizoaffective disorder (HCC)    No past surgical history on file. Family History: No family history on file. Family Psychiatric  History:   -not noted Social History:  Social History   Substance and Sexual Activity  Alcohol Use Not Currently     Social History   Substance and Sexual Activity  Drug Use Yes  . Types: Marijuana    Social History   Socioeconomic History  . Marital status: Single    Spouse name: Not on file  . Number of children: 0  . Years of education: Not on file  . Highest education level: Not on file  Occupational History  . Not on file  Tobacco Use  . Smoking status: Former Smoker    Quit date: 07/31/2014    Years since quitting: 6.3  . Smokeless tobacco: Never Used  Vaping Use  . Vaping Use: Every day  . Substances: THC, CBD  Substance and Sexual Activity  . Alcohol use: Not Currently  . Drug use: Yes    Types: Marijuana  . Sexual activity: Never  Other Topics Concern  . Not on file  Social History Narrative  . Not on file   Social Determinants of Health   Financial Resource Strain: Not on file  Food Insecurity: Not on file  Transportation Needs: Not on file  Physical Activity: Not on file  Stress: Not on file  Social Connections: Not on file   SDOH:  SDOH Screenings   Alcohol Screen: Not on file  Depression (PHQ2-9): Medium Risk  . PHQ-2 Score: 26  Financial Resource Strain: Not on file  Food Insecurity: Not on file  Housing: Not on file  Physical Activity: Not on file  Social Connections: Not on file  Stress: Not on file  Tobacco Use: Medium Risk  . Smoking Tobacco Use: Former Smoker  . Smokeless Tobacco Use: Never Used  Transportation Needs: Not on file   Additional Social History:    Pain Medications: See MAR Prescriptions: See MAR Over the Counter: See MAR History of alcohol / drug  use?: Yes (reports hsitory of THC and alcohol use; no current use) Longest period of sobriety (when/how long): 02/21/2012 - 09/05/2012 (first time of sobriety); Current sobriety started 08/22/2016 "first day sober from alcohol" Negative Consequences of Use:  (None Reported) Withdrawal Symptoms:  (None Reported)   Sleep: Poor  Appetite:  Poor  Current Medications:  Current Facility-Administered Medications  Medication Dose Route Frequency Provider Last Rate Last Admin  . gabapentin (NEURONTIN) capsule 300 mg  300 mg Oral TID Nwoko, Uchenna E, PA   300 mg at 11/19/20 4098  . hydrOXYzine (ATARAX/VISTARIL) tablet 25 mg  25 mg Oral TID PRN Leevy-Johnson, Brooke A, NP      . traZODone (DESYREL) tablet 50 mg  50 mg Oral QHS PRN Leevy-Johnson, Brooke A, NP       Current Outpatient Medications  Medication Sig Dispense Refill  . hydrOXYzine (ATARAX/VISTARIL) 10 MG tablet Take 1 tablet (10 mg total) by mouth 3 (three) times daily as needed. 75 tablet 1  . lamoTRIgine (LAMICTAL) 25 MG tablet Take 1 tablet (25 mg total) by mouth daily. 30  tablet 0  . risperiDONE (RISPERDAL) 1 MG tablet Take 1 tablet (1 mg total) by mouth daily. 30 tablet 1  . risperiDONE (RISPERDAL) 2 MG tablet Take 1 tablet (2 mg total) by mouth at bedtime. 30 tablet 1  . sertraline (ZOLOFT) 100 MG tablet Take 1 tablet (100 mg total) by mouth daily. 30 tablet 1  . traZODone (DESYREL) 100 MG tablet Take 1 tablet (100 mg total) by mouth at bedtime. 30 tablet 1    Labs  Lab Results:  Admission on 11/18/2020  Component Date Value Ref Range Status  . SARS Coronavirus 2 by RT PCR 11/18/2020 NEGATIVE  NEGATIVE Final   Comment: (NOTE) SARS-CoV-2 target nucleic acids are NOT DETECTED.  The SARS-CoV-2 RNA is generally detectable in upper respiratory specimens during the acute phase of infection. The lowest concentration of SARS-CoV-2 viral copies this assay can detect is 138 copies/mL. A negative result does not preclude  SARS-Cov-2 infection and should not be used as the sole basis for treatment or other patient management decisions. A negative result may occur with  improper specimen collection/handling, submission of specimen other than nasopharyngeal swab, presence of viral mutation(s) within the areas targeted by this assay, and inadequate number of viral copies(<138 copies/mL). A negative result must be combined with clinical observations, patient history, and epidemiological information. The expected result is Negative.  Fact Sheet for Patients:  BloggerCourse.com  Fact Sheet for Healthcare Providers:  SeriousBroker.it  This test is no                          t yet approved or cleared by the [place] FDA and  has been authorized for detection and/or diagnosis of SARS-CoV-2 by FDA under an Emergency Use Authorization (EUA). This EUA will remain  in effect (meaning this test can be used) for the duration of the COVID-19 declaration under Section 564(b)(1) of the Act, 21 U.S.C.section 360bbb-3(b)(1), unless the authorization is terminated  or revoked sooner.      . Influenza A by PCR 11/18/2020 NEGATIVE  NEGATIVE Final  . Influenza B by PCR 11/18/2020 NEGATIVE  NEGATIVE Final   Comment: (NOTE) The Xpert Xpress SARS-CoV-2/FLU/RSV plus assay is intended as an aid in the diagnosis of influenza from Nasopharyngeal swab specimens and should not be used as a sole basis for treatment. Nasal washings and aspirates are unacceptable for Xpert Xpress SARS-CoV-2/FLU/RSV testing.  Fact Sheet for Patients: BloggerCourse.com  Fact Sheet for Healthcare Providers: SeriousBroker.it  This test is not yet approved or cleared by the [place] FDA and has been authorized for detection and/or diagnosis of SARS-CoV-2 by FDA under an Emergency Use Authorization (EUA). This EUA will remain in effect  (meaning this test can be used) for the duration of the COVID-19 declaration under Section 564(b)(1) of the Act, 21 U.S.C. section 360bbb-3(b)(1), unless the authorization is terminated or revoked.  Performed at [facility], [address] [place], [place] [address]   . Alcohol, Ethyl (B) 11/18/2020 <10  <10 mg/dL Final   Comment: (NOTE) Lowest detectable limit for serum alcohol is 10 mg/dL.  For medical purposes only. Performed at [facility], [address] [place], [place] [address]   . WBC 11/18/2020 10.3  4.0 - 10.5 K/uL Final  . RBC 11/18/2020 4.91  4.22 - 5.81 MIL/uL Final  . Hemoglobin 11/18/2020 14.8  13.0 - 17.0 g/dL Final  . HCT 09/81/1914 44.7  39.0 - 52.0 % Final  .  MCV 11/18/2020 91.0  80.0 - 100.0 fL Final  . MCH 11/18/2020 30.1  26.0 - 34.0 pg Final  . MCHC 11/18/2020 33.1  30.0 - 36.0 g/dL Final  . RDW 94/85/4627 12.3  11.5 - 15.5 % Final  . Platelets 11/18/2020 281  150 - 400 K/uL Final  . nRBC 11/18/2020 0.0  0.0 - 0.2 % Final  . Neutrophils Relative % 11/18/2020 79  % Final  . Neutro Abs 11/18/2020 8.1* 1.7 - 7.7 K/uL Final  . Lymphocytes Relative 11/18/2020 12  % Final  . Lymphs Abs 11/18/2020 1.2  0.7 - 4.0 K/uL Final  . Monocytes Relative 11/18/2020 8  % Final  . Monocytes Absolute 11/18/2020 0.8  0.1 - 1.0 K/uL Final  . Eosinophils Relative 11/18/2020 0  % Final  . Eosinophils Absolute 11/18/2020 0.0  0.0 - 0.5 K/uL Final  . Basophils Relative 11/18/2020 1  % Final  . Basophils Absolute 11/18/2020 0.1  0.0 - 0.1 K/uL Final  . Immature Granulocytes 11/18/2020 0  % Final  . Abs Immature Granulocytes 11/18/2020 0.03  0.00 - 0.07 K/uL Final   Performed at [facility], [address] [place], [place] [address]  . Sodium 11/18/2020 138  135 - 145 mmol/L Final  . Potassium 11/18/2020 4.6  3.5 - 5.1 mmol/L Final  . Chloride 11/18/2020 100  98 - 111 mmol/L Final  . CO2 11/18/2020 32  22 - 32 mmol/L Final  . Glucose, Bld 11/18/2020 71   70 - 99 mg/dL Final   Glucose reference range applies only to samples taken after fasting for at least 8 hours.  . BUN 11/18/2020 9  6 - 20 mg/dL Final  . Creatinine, Ser 11/18/2020 0.83  0.61 - 1.24 mg/dL Final  . Calcium 93/81/8299 9.4  8.9 - 10.3 mg/dL Final  . Total Protein 11/18/2020 6.8  6.5 - 8.1 g/dL Final  . Albumin 37/16/9678 4.5  3.5 - 5.0 g/dL Final  . AST 93/81/0175 23  15 - 41 U/L Final  . ALT 11/18/2020 20  0 - 44 U/L Final  . Alkaline Phosphatase 11/18/2020 61  38 - 126 U/L Final  . Total Bilirubin 11/18/2020 1.7* 0.3 - 1.2 mg/dL Final  . GFR, Estimated 11/18/2020 >60  >60 mL/min Final   Comment: (NOTE) Calculated using the CKD-EPI Creatinine Equation (2021)   . Anion gap 11/18/2020 6  5 - 15 Final   Performed at [facility], [address], [place], [place] [address]  . TSH 11/18/2020 0.817  0.350 - 4.500 uIU/mL Final   Comment: Performed by a 3rd Generation assay with a functional sensitivity of <=0.01 uIU/mL. Performed at [facility], [address], [place], [place] [address]   . POC Amphetamine UR 11/18/2020 None Detected  NONE DETECTED (Cut Off Level 1000 ng/mL) Final  . POC Secobarbital (BAR) 11/18/2020 None Detected  NONE DETECTED (Cut Off Level 300 ng/mL) Final  . POC Buprenorphine (BUP) 11/18/2020 None Detected  NONE DETECTED (Cut Off Level 10 ng/mL) Final  . POC Oxazepam (BZO) 11/18/2020 None Detected  NONE DETECTED (Cut Off Level 300 ng/mL) Final  . POC Cocaine UR 11/18/2020 None Detected  NONE DETECTED (Cut Off Level 300 ng/mL) Final  . POC Methamphetamine UR 11/18/2020 None Detected  NONE DETECTED (Cut Off Level 1000 ng/mL) Final  . POC Morphine 11/18/2020 None Detected  NONE DETECTED (Cut Off Level 300 ng/mL) Final  . POC Oxycodone UR 11/18/2020 None Detected  NONE DETECTED (  Cut Off Level 100 ng/mL) Final  . POC Methadone UR 11/18/2020 None Detected  NONE DETECTED (Cut Off Level 300 ng/mL) Final  . POC Marijuana UR 11/18/2020 None Detected   NONE DETECTED (Cut Off Level 50 ng/mL) Final  . SARS Coronavirus 2 Ag 11/18/2020 Negative  Negative Final  . SARS Coronavirus 2 Ag 11/18/2020 NEGATIVE  NEGATIVE Final   Comment: (NOTE) SARS-CoV-2 antigen NOT DETECTED.   Negative results are presumptive.  Negative results do not preclude SARS-CoV-2 infection and should not be used as the sole basis for treatment or other patient management decisions, including infection  control decisions, particularly in the presence of clinical signs and  symptoms consistent with COVID-19, or in those who have been in contact with the virus.  Negative results must be combined with clinical observations, patient history, and epidemiological information. The expected result is Negative.  Fact Sheet for Patients: https://www.jennings-.com/  Fact Sheet for Healthcare Providers: https://alexander-rogers.biz/  This test is not yet approved or cleared by the [place] FDA and  has been authorized for detection and/or diagnosis of SARS-CoV-2 by FDA under an Emergency Use Authorization (EUA).  This EUA will remain in effect (meaning this test can be used) for the duration of  the COV                          ID-19 declaration under Section 564(b)(1) of the Act, 21 U.S.C. section 360bbb-3(b)(1), unless the authorization is terminated or revoked sooner.    . Cholesterol 11/18/2020 188  0 - 200 mg/dL Final  . Triglycerides 11/18/2020 124  <150 mg/dL Final  . HDL 50/53/9767 69  >40 mg/dL Final  . Total CHOL/HDL Ratio 11/18/2020 2.7  RATIO Final  . VLDL 11/18/2020 25  0 - 40 mg/dL Final  . LDL Cholesterol 11/18/2020 94  0 - 99 mg/dL Final   Comment:        Total Cholesterol/HDL:CHD Risk Coronary Heart Disease Risk Table                     Men   Women  1/2 Average Risk   3.4   3.3  Average Risk       5.0   4.4  2 X Average Risk   9.6   7.1  3 X Average Risk  23.4   11.0        Use the calculated Patient Ratio above and  the CHD Risk Table to determine the patient's CHD Risk.        ATP III CLASSIFICATION (LDL):  <100     mg/dL   Optimal  341-937  mg/dL   Near or Above                    Optimal  130-159  mg/dL   Borderline  902-409  mg/dL   High  >735     mg/dL   Very High Performed at [facility], [address], [place], [place] [address]   Admission on 06/28/2020, Discharged on 06/29/2020  Component Date Value Ref Range Status  . SARS Coronavirus 2 by RT PCR 06/28/2020 NEGATIVE  NEGATIVE Final   Comment: (NOTE) SARS-CoV-2 target nucleic acids are NOT DETECTED.  The SARS-CoV-2 RNA is generally detectable in upper respiratoy specimens during the acute phase of infection. The lowest concentration of SARS-CoV-2 viral copies this assay can detect is 131 copies/mL. A negative result does not preclude SARS-Cov-2  infection and should not be used as the sole basis for treatment or other patient management decisions. A negative result may occur with  improper specimen collection/handling, submission of specimen other than nasopharyngeal swab, presence of viral mutation(s) within the areas targeted by this assay, and inadequate number of viral copies (<131 copies/mL). A negative result must be combined with clinical observations, patient history, and epidemiological information. The expected result is Negative.  Fact Sheet for Patients:  https://www.moore.com/  Fact Sheet for Healthcare Providers:  https://www.young.biz/  This test is no                          t yet approved or cleared by the [place] FDA and  has been authorized for detection and/or diagnosis of SARS-CoV-2 by FDA under an Emergency Use Authorization (EUA). This EUA will remain  in effect (meaning this test can be used) for the duration of the COVID-19 declaration under Section 564(b)(1) of the Act, 21 U.S.C. section 360bbb-3(b)(1), unless the authorization is terminated  or revoked sooner.    . Influenza A by PCR 06/28/2020 NEGATIVE  NEGATIVE Final  . Influenza B by PCR 06/28/2020 NEGATIVE  NEGATIVE Final   Comment: (NOTE) The Xpert Xpress SARS-CoV-2/FLU/RSV assay is intended as an aid in  the diagnosis of influenza from Nasopharyngeal swab specimens and  should not be used as a sole basis for treatment. Nasal washings and  aspirates are unacceptable for Xpert Xpress SARS-CoV-2/FLU/RSV  testing.  Fact Sheet for Patients: https://www.moore.com/  Fact Sheet for Healthcare Providers: https://www.young.biz/  This test is not yet approved or cleared by the [place] FDA and  has been authorized for detection and/or diagnosis of SARS-CoV-2 by  FDA under an Emergency Use Authorization (EUA). This EUA will remain  in effect (meaning this test can be used) for the duration of the  Covid-19 declaration under Section 564(b)(1) of the Act, 21  U.S.C. section 360bbb-3(b)(1), unless the authorization is  terminated or revoked. Performed at [facility], [address], [place], [place] [address]   . WBC 06/28/2020 9.1  4.0 - 10.5 K/uL Final  . RBC 06/28/2020 5.26  4.22 - 5.81 MIL/uL Final  . Hemoglobin 06/28/2020 15.7  13.0 - 17.0 g/dL Final  . HCT 69/62/9528 47.9  39.0 - 52.0 % Final  . MCV 06/28/2020 91.1  80.0 - 100.0 fL Final  . MCH 06/28/2020 29.8  26.0 - 34.0 pg Final  . MCHC 06/28/2020 32.8  30.0 - 36.0 g/dL Final  . RDW 41/32/4401 11.9  11.5 - 15.5 % Final  . Platelets 06/28/2020 258  150 - 400 K/uL Final  . nRBC 06/28/2020 0.0  0.0 - 0.2 % Final  . Neutrophils Relative % 06/28/2020 78  % Final  . Neutro Abs 06/28/2020 7.0  1.7 - 7.7 K/uL Final  . Lymphocytes Relative 06/28/2020 16  % Final  . Lymphs Abs 06/28/2020 1.5  0.7 - 4.0 K/uL Final  . Monocytes Relative 06/28/2020 6  % Final  . Monocytes Absolute 06/28/2020 0.6  0.1 - 1.0 K/uL Final  . Eosinophils Relative 06/28/2020 0  % Final  .  Eosinophils Absolute 06/28/2020 0.0  0.0 - 0.5 K/uL Final  . Basophils Relative 06/28/2020 0  % Final  . Basophils Absolute 06/28/2020 0.0  0.0 - 0.1 K/uL Final  . Immature Granulocytes 06/28/2020 0  % Final  . Abs Immature Granulocytes 06/28/2020 0.02  0.00 - 0.07 K/uL Final  Performed at [facility], [address], [place], [place] [address]  . Sodium 06/28/2020 141  135 - 145 mmol/L Final  . Potassium 06/28/2020 4.3  3.5 - 5.1 mmol/L Final  . Chloride 06/28/2020 100  98 - 111 mmol/L Final  . CO2 06/28/2020 28  22 - 32 mmol/L Final  . Glucose, Bld 06/28/2020 74  70 - 99 mg/dL Final   Glucose reference range applies only to samples taken after fasting for at least 8 hours.  . BUN 06/28/2020 10  6 - 20 mg/dL Final  . Creatinine, Ser 06/28/2020 0.75  0.61 - 1.24 mg/dL Final  . Calcium 60/45/4098 10.0  8.9 - 10.3 mg/dL Final  . Total Protein 06/28/2020 7.3  6.5 - 8.1 g/dL Final  . Albumin 11/91/4782 4.7  3.5 - 5.0 g/dL Final  . AST 95/62/1308 16  15 - 41 U/L Final  . ALT 06/28/2020 14  0 - 44 U/L Final  . Alkaline Phosphatase 06/28/2020 64  38 - 126 U/L Final  . Total Bilirubin 06/28/2020 3.2* 0.3 - 1.2 mg/dL Final  . GFR, Estimated 06/28/2020 >60  >60 mL/min Final   Comment: (NOTE) Calculated using the CKD-EPI Creatinine Equation (2021)   . Anion gap 06/28/2020 13  5 - 15 Final   Performed at [facility], [address], [place], [place] [address]  . Hgb A1c MFr Bld 06/28/2020 5.7* 4.8 - 5.6 % Final   Comment: (NOTE)         Prediabetes: 5.7 - 6.4         Diabetes: >6.4         Glycemic control for adults with diabetes: <7.0   . Mean Plasma Glucose 06/28/2020 117  mg/dL Final   Comment: (NOTE) Performed At: [facility] [address] [place], [place] [id number] Jolene Schimke MD XL:[id number]   . Cholesterol 06/28/2020 197  0 - 200 mg/dL Final  . Triglycerides 06/28/2020 49  <150 mg/dL Final  . HDL 36/64/4034 59  >40 mg/dL Final  . Total CHOL/HDL  Ratio 06/28/2020 3.3  RATIO Final  . VLDL 06/28/2020 10  0 - 40 mg/dL Final  . LDL Cholesterol 06/28/2020 128* 0 - 99 mg/dL Final   Comment:        Total Cholesterol/HDL:CHD Risk Coronary Heart Disease Risk Table                     Men   Women  1/2 Average Risk   3.4   3.3  Average Risk       5.0   4.4  2 X Average Risk   9.6   7.1  3 X Average Risk  23.4   11.0        Use the calculated Patient Ratio above and the CHD Risk Table to determine the patient's CHD Risk.        ATP III CLASSIFICATION (LDL):  <100     mg/dL   Optimal  742-595  mg/dL   Near or Above                    Optimal  130-159  mg/dL   Borderline  638-756  mg/dL   High  >433     mg/dL   Very High Performed at [facility], [address] [place], [place] [address]   . TSH 06/28/2020 1.452  0.350 - 4.500 uIU/mL Final   Comment: Performed by a 3rd  Generation assay with a functional sensitivity of <=0.01 uIU/mL. Performed at [facility], [address] [place], [place] [address]   . POC Amphetamine UR 06/28/2020 None Detected  None Detected Final  . POC Secobarbital (BAR) 06/28/2020 None Detected  None Detected Final  . POC Buprenorphine (BUP) 06/28/2020 None Detected  None Detected Final  . POC Oxazepam (BZO) 06/28/2020 None Detected  None Detected Final  . POC Cocaine UR 06/28/2020 None Detected  None Detected Final  . POC Methamphetamine UR 06/28/2020 None Detected  None Detected Final  . POC Morphine 06/28/2020 None Detected  None Detected Final  . POC Oxycodone UR 06/28/2020 None Detected  None Detected Final  . POC Methadone UR 06/28/2020 None Detected  None Detected Final  . POC Marijuana UR 06/28/2020 Positive* None Detected Final  Office Visit on 06/28/2020  Component Date Value Ref Range Status  . SARS Coronavirus 2 Ag 06/28/2020 NEGATIVE  NEGATIVE Final   Comment: (NOTE) SARS-CoV-2 antigen NOT DETECTED.   Negative results are presumptive.  Negative results do not  preclude SARS-CoV-2 infection and should not be used as the sole basis for treatment or other patient management decisions, including infection  control decisions, particularly in the presence of clinical signs and  symptoms consistent with COVID-19, or in those who have been in contact with the virus.  Negative results must be combined with clinical observations, patient history, and epidemiological information. The expected result is Negative.  Fact Sheet for Patients: https://sanders-williams.net/  Fact Sheet for Healthcare Providers: https://martinez.com/   This test is not yet approved or cleared by the [place] FDA and  has been authorized for detection and/or diagnosis of SARS-CoV-2 by FDA under an Emergency Use Authorization (EUA).  This EUA will remain in effect (meaning this test can be used) for the duration of  the C                          OVID-19 declaration under Section 564(b)(1) of the Act, 21 U.S.C. section 360bbb-3(b)(1), unless the authorization is terminated or revoked sooner.      Blood Alcohol level:  Lab Results  Component Value Date   ETH <10 11/18/2020    Metabolic Disorder Labs: Lab Results  Component Value Date   HGBA1C 5.7 (H) 06/28/2020   MPG 117 06/28/2020   No results found for: PROLACTIN Lab Results  Component Value Date   CHOL 188 11/18/2020   TRIG 124 11/18/2020   HDL 69 11/18/2020   CHOLHDL 2.7 11/18/2020   VLDL 25 11/18/2020   LDLCALC 94 11/18/2020   LDLCALC 128 (H) 06/28/2020    Therapeutic Lab Levels: No results found for: LITHIUM No results found for: VALPROATE No components found for:  CBMZ  Physical Findings   GAD-7   Flowsheet Row Office Visit from 11/07/2020 in [facility] Office Visit from 10/26/2020 in [facility]  Total GAD-7 Score 19 19    PHQ2-9   Flowsheet Row Office Visit from 11/07/2020 in [facility] Office Visit from 10/26/2020 in [facility] ED from 09/19/2020 in [facility] ED from 06/28/2020 in [place]  PHQ-2 Total Score PHQ-9 Total Score -    Flowsheet Row ED from 11/18/2020 in [facility] Office Visit from 11/07/2020 in [facility] Office  [facility]  C-SSRS RISK CATEGORY Error: Q3, 4, or 5 should not be populated when Q2 is No Low Risk Low Risk       Musculoskeletal  Strength & Muscle Tone: within normal limits Gait & Station: normal Patient leans: N/A  Psychiatric Specialty Exam  Presentation  General Appearance: Appropriate for Environment  Eye Contact:Fleeting  Speech:Pressured  Speech Volume:Normal  Handedness:Right  Mood and Affect  Mood:Anxious  Affect:Inappropriate  Thought Process  Thought Processes:Other (comment) (paranoid)  Descriptions of Associations:-- (paranoid)  Orientation:Full (Time, Place and Person)  Thought Content:Illogical; Perseveration; Delusions (somatic delusions)  Diagnosis of Schizophrenia or Schizoaffective disorder in past: Yes  Duration of Psychotic Symptoms: Less than six months   Hallucinations:Hallucinations: Other (comment) (somatic) Description of Auditory Hallucinations: Patient endorses auditory hallucinations characterized by sounds becoming voices Description of Visual Hallucinations: Patient endorses visual hallucinations characterized by things morphing into things  Ideas of Reference:Delusions  Suicidal Thoughts:Suicidal Thoughts: No  Homicidal Thoughts:Homicidal Thoughts: No  Sensorium  Memory:Immediate Fair; Recent Fair; Remote Fair  Judgment:Impaired  Insight:Poor  Executive Functions  Concentration:Fair  Attention Span:Fair  Recall:Fair  Fund of  Knowledge:Fair  Language:Fair  Psychomotor Activity  Psychomotor Activity:Psychomotor Activity: Restlessness  Assets  Assets:Housing; Physical Health; Resilience; Social Support  Sleep  Sleep:Sleep: Fair  Nutritional Assessment (For OBS and FBC admissions only) Has the patient had a weight loss or gain of 10 pounds or more in the last 3 months?: Yes (Patient reports that he has lost 20 pounds over several weeks) Has the patient had a decrease in food intake/or appetite?: No Does the patient have dental problems?: No Does the patient have eating habits or behaviors that may be indicators of an eating disorder including binging or inducing vomiting?: No Has the patient recently lost weight without trying?: Yes, 14-23 lbs. Has the patient been eating poorly because of a decreased appetite?: No Malnutrition Screening Tool Score: 2 Nutritional Assessment Referrals: Medication/Tx changes   Physical Exam  Physical Exam Psychiatric:        Attention and Perception: He is inattentive.        Mood and Affect: Mood is anxious. Affect is inappropriate.        Speech: Speech normal.        Behavior: Behavior is uncooperative.        Thought Content: Thought content is paranoid and delusional.        Cognition and Memory: Cognition and memory normal.        Judgment: Judgment is impulsive and inappropriate.    Review of Systems  Psychiatric/Behavioral: Positive for substance abuse. The patient is nervous/anxious.   All other systems reviewed and are negative.  Blood pressure 131/77, pulse 64, temperature 98.4 F (36.9 C), temperature source Tympanic, resp. rate 16, SpO2 100 %. There is no height or weight on file to calculate BMI.  Treatment Plan Summary: Daily contact with patient to assess and evaluate symptoms and progress in treatment, Medication management and Plan to admit to inpatient psychiatric hospital for further observation, stabilization, and treatment.   Loletta ParishBrooke A  Leevy-Johnson, NP 11/19/2020 11:01 AM

## 2020-11-19 NOTE — Progress Notes (Signed)
Received Sean Shepherd this AM awake in his chair bed, he continued to rest quietly throughout the morning. During reassessment he verbalized increased anxiety, unable to take a deep breath and feels he should go to the ER. He eventually decided to try the Risperdal to help him rest without anxiety. He is very anxious and tremors at intervals.

## 2020-11-19 NOTE — ED Notes (Signed)
Pt sleeping at present, no distress noted, monitoring for safety. 

## 2020-11-19 NOTE — ED Notes (Signed)
Pt given graham crackers and peanut butter.

## 2020-11-20 ENCOUNTER — Inpatient Hospital Stay (HOSPITAL_COMMUNITY)
Admission: AD | Admit: 2020-11-20 | Payer: Federal, State, Local not specified - Other | Source: Intra-hospital | Admitting: Emergency Medicine

## 2020-11-20 ENCOUNTER — Other Ambulatory Visit (HOSPITAL_COMMUNITY): Payer: Self-pay | Admitting: Psychiatry

## 2020-11-20 MED ORDER — HYDROXYZINE HCL 10 MG PO TABS
10.0000 mg | ORAL_TABLET | Freq: Three times a day (TID) | ORAL | Status: DC | PRN
  Filled 2020-11-20: qty 10

## 2020-11-20 MED ORDER — GABAPENTIN 300 MG PO CAPS: 300.0000 mg | ORAL_CAPSULE | Freq: Three times a day (TID) | ORAL | 0 refills | Status: DC

## 2020-11-20 MED ORDER — MIRTAZAPINE 15 MG PO TABS: 15.0000 mg | ORAL_TABLET | Freq: Every day | ORAL | 0 refills | Status: DC

## 2020-11-20 MED ORDER — RISPERIDONE 2 MG PO TABS: 2.0000 mg | ORAL_TABLET | Freq: Every day | ORAL | 0 refills | Status: DC

## 2020-11-20 MED ORDER — RISPERIDONE 1 MG PO TABS: 1.0000 mg | ORAL_TABLET | Freq: Every day | ORAL | 11 refills | Status: DC

## 2020-11-20 MED ORDER — MIRTAZAPINE 15 MG PO TABS
15.0000 mg | ORAL_TABLET | Freq: Every day | ORAL | Status: DC
  Filled 2020-11-20: qty 7

## 2020-11-20 MED FILL — risperiDONE 2 MG TABS: 2 | 30 days supply | Qty: 30 | Fill #0

## 2020-11-20 MED FILL — risperiDONE 1 MG TABS: 1 | 30 days supply | Qty: 30 | Fill #0

## 2020-11-20 MED FILL — GABAPENTIN 300 MG CAPSULE: 300 | 30 days supply | Qty: 90 | Fill #0

## 2020-11-20 MED FILL — MIRTAZAPINE 15 MG TABLET: 15 | 10 days supply | Qty: 10 | Fill #0

## 2020-11-20 NOTE — ED Provider Notes (Signed)
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 Discussed medications, he states that he stopped risperdal because he thought it was making him more depressed, although admits that the depression remained even after stopping. Discussed starting remeron after discussing r/b/se/ae. Pt agreeable. Pt reports anxiety related to feeling that he is in respiratory failure and may die; however, O2 sat is 100% and he has unlabored breathing. Pt is requesting discharge and is not IVC-able. He has an appointment with PA Nwoko in 2 weeks for outpatient appointment at the [facility]. Recommended that pt present for appointment. Samples ordered for risperdal, gabapentin, trazodone, vistaril, and remeron and sent in bridge rx to make it to appointment with Nwoko. Spoke to mother who stated she would pick him up. Prior to discharge, discussed return precautions for patient-SI/HI/psychotic sx. Pt verbalized understanding  Spoke to mother day of discharge who verbalized concern for getting off medication but does not believe he is a danger to himself and others. Is aware of upcoming psychiatry appointment. No safety concerns. She will pick him up.   Stay Summary:    Sean Shepherd is a 45 year old male with a past psychiatric history significant for, past sub major depressive disorder, generalized anxiety disorder, panic disorder, and insomnia who presents to [facility] for worsening anxiety and panic on 3/19 who was initially assessed by NP Nwoko who has seen pt routinely in outpatient setting at [facility]. Patient has a past history of being managed  for schizoaffective disorder. Patient was originally managed on risperidone which was eventually discontinued on the request patient due to believing his psychotic symptoms were due to past marijuana/THC use. Patient contacted provider on 11/09/2020 with the request to be placed back on risperidone and a script was sent to his pharmacy of choice. Patient reports that he has yet to start his risperidone. Pt presented with anxiety and delusion of havign cancer, he was admitted for observation and restarted on medication. He was restarted on Risperdal 1 mg/2 mg and was started on gabapentin for anxiety. Pt was medication compliant during his stay and was requesting discharge after spending ~50 hours in the [facility] without finding appropriate bed. See above for day of discharge interview- pt denied SI/HI/AVH and felt safe for discharge. Pt with some ongoing delusional TC although not IVC-able and was appropriate for discharge. Pt discharged with medications and follow up appointment .   Total Time spent with patient: 30 minutes  Past Psychiatric History: see H&P Past Medical History:  Past Medical History:  Diagnosis Date  . Bipolar disorder (HCC)   . Depression   . Schizoaffective disorder (HCC)    No past surgical history on file. Family History: No family history on file. Family Psychiatric History: see H&P Social History:  Social History   Substance and Sexual Activity  Alcohol Use Not Currently     Social History   Substance and Sexual Activity  Drug Use Yes  . Types: Marijuana    Social History   Socioeconomic History  . Marital status: Single    Spouse name: Not on file  . Number of children: 0  . Years of education: Not on file  . Highest  education level: Not on file  Occupational History  . Not on file  Tobacco Use  . Smoking status: Former Smoker    Quit date: 07/31/2014    Years since quitting: 6.3  . Smokeless tobacco: Never Used  Vaping Use  . Vaping Use: Every day   . Substances: THC, CBD  Substance and Sexual Activity  . Alcohol use: Not Currently  . Drug use: Yes    Types: Marijuana  . Sexual activity: Never  Other Topics Concern  . Not on file  Social History Narrative  . Not on file   Social Determinants of Health   Financial Resource Strain: Not on file  Food Insecurity: Not on file  Transportation Needs: Not on file  Physical Activity: Not on file  Stress: Not on file  Social Connections: Not on file   SDOH:  SDOH Screenings   Alcohol Screen: Not on file  Depression (PHQ2-9): Medium Risk  . PHQ-2 Score: 26  Financial Resource Strain: Not on file  Food Insecurity: Not on file  Housing: Not on file  Physical Activity: Not on file  Social Connections: Not on file  Stress: Not on file  Tobacco Use: Medium Risk  . Smoking Tobacco Use: Former Smoker  . Smokeless Tobacco Use: Never Used  Transportation Needs: Not on file    Has this patient used any form of tobacco in the last 30 days? (Cigarettes, Smokeless Tobacco, Cigars, and/or Pipes) Prescription not provided because: n/a  Current Medications:  Current Facility-Administered Medications  Medication Dose Route Frequency Provider Last Rate Last Admin  . gabapentin (NEURONTIN) capsule 300 mg  300 mg Oral TID Nwoko, Uchenna E, PA   300 mg at 11/20/20 1194  . hydrOXYzine (ATARAX/VISTARIL) tablet 10 mg  10 mg Oral TID PRN Estella Husk, MD      . hydrOXYzine (ATARAX/VISTARIL) tablet 25 mg  25 mg Oral TID PRN Leevy-Johnson, Brooke A, NP      . mirtazapine (REMERON) tablet 15 mg  15 mg Oral QHS Estella Husk, MD      . risperiDONE (RISPERDAL) tablet 1 mg  1 mg Oral Daily Leevy-Johnson, Brooke A, NP   1 mg at 11/20/20 1740  . risperiDONE (RISPERDAL) tablet 2 mg  2 mg Oral QHS Leevy-Johnson, Brooke A, NP   2 mg at 11/19/20 2107  . traZODone (DESYREL) tablet 100 mg  100 mg Oral QHS Leevy-Johnson, Brooke A, NP   100 mg at 11/19/20 2106   Current Outpatient Medications   Medication Sig Dispense Refill  . gabapentin (NEURONTIN) 300 MG capsule Take 1 capsule (300 mg total) by mouth 3 (three) times daily. 90 capsule 0  . hydrOXYzine (ATARAX/VISTARIL) 10 MG tablet Take 1 tablet (10 mg total) by mouth 3 (three) times daily as needed. (Patient not taking: Reported on 11/19/2020) 75 tablet 1  . mirtazapine (REMERON) 15 MG tablet Take 1 tablet (15 mg total) by mouth at bedtime. 10 tablet 0  . risperiDONE (RISPERDAL) 1 MG tablet Take 1 tablet (1 mg total) by mouth daily. 30 tablet 11  . risperiDONE (RISPERDAL) 2 MG tablet Take 1 tablet (2 mg total) by mouth at bedtime. 30 tablet 0  . traZODone (DESYREL) 100 MG tablet Take 1 tablet (100 mg total) by mouth at bedtime. 30 tablet 1    PTA Medications: (Not in a hospital admission)   Musculoskeletal  Strength & Muscle Tone: within normal limits Gait & Station: normal Patient leans: N/A  Psychiatric Specialty Exam  Presentation  General Appearance: Appropriate for Environment; Casual  Eye Contact:Fleeting  Speech:Clear and Coherent; Other (comment) (mostly normal rate, rapid at times)  Speech Volume:Normal  Handedness:Right   Mood and Affect  Mood:Anxious  Affect:Appropriate; Congruent   Thought Process  Thought Processes:Coherent; Goal Directed  Descriptions of Associations:Intact  Orientation:Full (Time, Place and Person)  Thought Content:Paranoid Ideation; Delusions (somatic delusions, paranoia)  Diagnosis of Schizophrenia or Schizoaffective disorder in past: Yes  Duration of Psychotic Symptoms: Less than six months   Hallucinations:Hallucinations: None  Ideas of Reference:Delusions; Paranoia  Suicidal Thoughts:Suicidal Thoughts: No  Homicidal Thoughts:Homicidal Thoughts: No   Sensorium  Memory:Immediate Good; Recent Good; Remote Fair  Judgment:Fair  Insight:Present   Executive Functions  Concentration:Fair  Attention Span:Fair  Recall:Fair  Fund of  Knowledge:Fair  Language:Good   Psychomotor Activity  Psychomotor Activity:Psychomotor Activity: Restlessness   Assets  Assets:Housing; Physical Health; Resilience; Social Support   Sleep  Sleep:Sleep: Poor   No data recorded  Physical Exam  Physical Exam Constitutional:      Appearance: Normal appearance.  HENT:     Head: Normocephalic and atraumatic.  Pulmonary:     Effort: Pulmonary effort is normal.  Neurological:     Mental Status: He is alert.    Review of Systems  Constitutional: Negative for chills and fever.  Eyes: Negative for discharge.  Respiratory: Negative for cough.   Cardiovascular: Negative for chest pain.  Gastrointestinal: Negative for abdominal pain.  Neurological: Negative for headaches.  Psychiatric/Behavioral: Negative for depression and suicidal ideas. The patient is nervous/anxious.    Blood pressure 116/85, pulse 71, temperature 97.7 F (36.5 C), temperature source Oral, resp. rate 18, SpO2 100 %. There is no height or weight on file to calculate BMI.  Demographic Factors:  Male, Caucasian, Low socioeconomic status and Living alone  Loss Factors: NA  Historical Factors: NA  Risk Reduction Factors:   Sense of responsibility to family and Positive social support  Continued Clinical Symptoms:  Previous Psychiatric Diagnoses and Treatments  Cognitive Features That Contribute To Risk:  Thought constriction (tunnel vision)    Suicide Risk:  Minimal: No identifiable suicidal ideation.  Patients presenting with no risk factors but with morbid ruminations; may be classified as minimal risk based on the severity of the depressive symptoms  Plan Of Care/Follow-up recommendations:  Activity:  as tolerated Diet:  regular Other:    Patient is instructed prior to discharge to: Take all medications as prescribed by his/her mental healthcare provider. Report any adverse effects and or reactions from the medicines to his/her outpatient  provider promptly. Patient has been instructed & cautioned: To not engage in alcohol and or illegal drug use while on prescription medicines. In the event of worsening symptoms, patient is instructed to call the crisis hotline, 911 and or go to the nearest ED for appropriate evaluation and treatment of symptoms. To follow-up with his/her primary care provider for your other medical issues, concerns and or health care needs.    Please follow up with PA [facility] 12/07/20.    Disposition: home/self care  Estella Husk, MD 11/20/2020, 4:56 PM

## 2020-11-20 NOTE — Progress Notes (Signed)
Pt is alert and oriented. Administered meds with no incident. Pt denies pain, SI, HI and AVH at this time. Staff will monitor for pt's safety.

## 2020-11-20 NOTE — Progress Notes (Addendum)
Pt accepted to [facility] room  404-1   Patient meets inpatient criteria per  Earlene Plater, MD.  Dr. Jola Babinski is the attending provider.    Call report to [phone]    MattLoflin, RN @ [facility] notified.     Pt is Voluntary.    Pt may be transported by safe transport.  Pt scheduled  to arrive at  [facility] after 2030  PM   Signed:  Corky Crafts, MSW, [place], LCASA 11/20/2020 6:04 PM

## 2020-11-20 NOTE — Progress Notes (Signed)
Pt is sleeping. Resps are even and unlabored. No signs of acute distress noted. Staff will monitor for pt's safety. 

## 2020-11-20 NOTE — ED Notes (Signed)
Pt asleep in bed. Respirations even and unlabored. Will continue to monitor for safety. ?

## 2020-11-20 NOTE — ED Notes (Signed)
GIVEN SNACK REFUSED DINNER

## 2020-11-20 NOTE — ED Notes (Signed)
Educated on d/c instructions and medications. Ambulated per self to retrieve belongings. No SI, HI, or AVH. No s/s pain, discomfort, or acute distress. A&O x4. Escorted to front lobby and d/c into care of mother. Stable at time of d/c

## 2020-11-20 NOTE — Progress Notes (Signed)
Per verbal conversation with Earlene Plater, MD, patient is unable to contract for safety. Earlene Plater, MD recommends inpatient treatment at this time.   Signed:  Corky Crafts, MSW, [place], LCASA 11/20/2020 5:53 PM

## 2020-11-20 NOTE — Discharge Instructions (Addendum)

## 2020-11-22 ENCOUNTER — Telehealth (HOSPITAL_COMMUNITY): Payer: Self-pay | Admitting: Physician Assistant

## 2020-11-22 NOTE — Telephone Encounter (Signed)
Patient came in requesting to speak with provider about MH crisis he had yesterday. Patient was at [facility] 2 days ago and was discharged. Writer informed patient that he would receive a call from provider to follow-up at his earliest convenience.

## 2020-11-27 MED FILL — GABAPENTIN 300 MG CAPSULE: 300 | 30 days supply | Qty: 90 | Fill #0

## 2020-11-27 MED FILL — MIRTAZAPINE 15 MG TABLET: 15 | 10 days supply | Qty: 10 | Fill #0

## 2020-11-27 MED FILL — risperiDONE 2 MG TABS: 2 | 30 days supply | Qty: 30 | Fill #0

## 2020-11-27 MED FILL — risperiDONE 1 MG TABS: 1 | 30 days supply | Qty: 30 | Fill #0

## 2020-12-07 ENCOUNTER — Encounter (HOSPITAL_COMMUNITY): Payer: No Payment, Other | Admitting: Physician Assistant

## 2020-12-20 ENCOUNTER — Ambulatory Visit (HOSPITAL_COMMUNITY): Payer: No Payment, Other | Admitting: Licensed Clinical Social Worker

## 2020-12-21 ENCOUNTER — Ambulatory Visit (HOSPITAL_COMMUNITY): Payer: No Payment, Other | Admitting: Licensed Clinical Social Worker

## 2020-12-25 ENCOUNTER — Emergency Department (HOSPITAL_COMMUNITY): Payer: Self-pay

## 2020-12-25 ENCOUNTER — Other Ambulatory Visit: Payer: Self-pay

## 2020-12-25 ENCOUNTER — Inpatient Hospital Stay (HOSPITAL_COMMUNITY)
Admission: EM | Admit: 2020-12-25 | Discharge: 2021-01-09 | DRG: 356 | Disposition: A | Discharge status: Other Acute Inpatient Hospital | Payer: Self-pay | Attending: General Surgery | Admitting: General Surgery

## 2020-12-25 ENCOUNTER — Emergency Department (HOSPITAL_COMMUNITY): Payer: Self-pay | Admitting: Anesthesiology

## 2020-12-25 ENCOUNTER — Encounter (HOSPITAL_COMMUNITY): Payer: Self-pay | Admitting: Radiology

## 2020-12-25 ENCOUNTER — Encounter (HOSPITAL_COMMUNITY)
Admission: EM | Disposition: A | Discharge status: Other Acute Inpatient Hospital | Payer: Self-pay | Source: Home / Self Care

## 2020-12-25 DIAGNOSIS — E43 Unspecified severe protein-calorie malnutrition: Secondary | ICD-10-CM | POA: Diagnosis present

## 2020-12-25 DIAGNOSIS — Y92009 Unspecified place in unspecified non-institutional (private) residence as the place of occurrence of the external cause: Secondary | ICD-10-CM

## 2020-12-25 DIAGNOSIS — N179 Acute kidney failure, unspecified: Secondary | ICD-10-CM | POA: Diagnosis present

## 2020-12-25 DIAGNOSIS — Z87891 Personal history of nicotine dependence: Secondary | ICD-10-CM

## 2020-12-25 DIAGNOSIS — S1181XA Laceration without foreign body of other specified part of neck, initial encounter: Secondary | ICD-10-CM | POA: Diagnosis present

## 2020-12-25 DIAGNOSIS — Q550 Absence and aplasia of testis: Secondary | ICD-10-CM

## 2020-12-25 DIAGNOSIS — T1490XA Injury, unspecified, initial encounter: Secondary | ICD-10-CM

## 2020-12-25 DIAGNOSIS — S31615A Laceration without foreign body of abdominal wall, periumbilic region with penetration into peritoneal cavity, initial encounter: Principal | ICD-10-CM | POA: Diagnosis present

## 2020-12-25 DIAGNOSIS — X781XXA Intentional self-harm by knife, initial encounter: Secondary | ICD-10-CM | POA: Diagnosis present

## 2020-12-25 DIAGNOSIS — Z20822 Contact with and (suspected) exposure to covid-19: Secondary | ICD-10-CM | POA: Diagnosis present

## 2020-12-25 DIAGNOSIS — Z681 Body mass index (BMI) 19 or less, adult: Secondary | ICD-10-CM

## 2020-12-25 DIAGNOSIS — F332 Major depressive disorder, recurrent severe without psychotic features: Secondary | ICD-10-CM | POA: Diagnosis present

## 2020-12-25 DIAGNOSIS — S08121A Partial traumatic amputation of right ear, initial encounter: Secondary | ICD-10-CM | POA: Diagnosis present

## 2020-12-25 DIAGNOSIS — F411 Generalized anxiety disorder: Secondary | ICD-10-CM | POA: Diagnosis present

## 2020-12-25 DIAGNOSIS — Z23 Encounter for immunization: Secondary | ICD-10-CM

## 2020-12-25 DIAGNOSIS — F250 Schizoaffective disorder, bipolar type: Secondary | ICD-10-CM | POA: Diagnosis present

## 2020-12-25 DIAGNOSIS — S38231A Complete traumatic amputation of scrotum and testis, initial encounter: Secondary | ICD-10-CM | POA: Diagnosis present

## 2020-12-25 DIAGNOSIS — Z9079 Acquired absence of other genital organ(s): Secondary | ICD-10-CM

## 2020-12-25 DIAGNOSIS — S71111A Laceration without foreign body, right thigh, initial encounter: Secondary | ICD-10-CM | POA: Diagnosis present

## 2020-12-25 DIAGNOSIS — S61512A Laceration without foreign body of left wrist, initial encounter: Secondary | ICD-10-CM | POA: Diagnosis present

## 2020-12-25 DIAGNOSIS — Z9889 Other specified postprocedural states: Secondary | ICD-10-CM

## 2020-12-25 DIAGNOSIS — T148XXA Other injury of unspecified body region, initial encounter: Secondary | ICD-10-CM | POA: Diagnosis present

## 2020-12-25 HISTORY — PX: LAPAROTOMY: SHX154

## 2020-12-25 LAB — CBC
HCT: 28.4 % — ABNORMAL LOW (ref 39.0–52.0)
Hemoglobin: 10 g/dL — ABNORMAL LOW (ref 13.0–17.0)
MCH: 31 pg (ref 26.0–34.0)
MCHC: 35.2 g/dL (ref 30.0–36.0)
MCV: 87.9 fL (ref 80.0–100.0)
Platelets: 222 10*3/uL (ref 150–400)
RBC: 3.23 MIL/uL — ABNORMAL LOW (ref 4.22–5.81)
RDW: 12 % (ref 11.5–15.5)
WBC: 26.1 10*3/uL — ABNORMAL HIGH (ref 4.0–10.5)
nRBC: 0 % (ref 0.0–0.2)

## 2020-12-25 LAB — PREPARE RBC (CROSSMATCH)

## 2020-12-25 LAB — I-STAT CHEM 8, ED
BUN: 82 mg/dL — ABNORMAL HIGH (ref 6–20)
Calcium, Ion: 0.99 mmol/L — ABNORMAL LOW (ref 1.15–1.40)
Chloride: 97 mmol/L — ABNORMAL LOW (ref 98–111)
Creatinine, Ser: 2.9 mg/dL — ABNORMAL HIGH (ref 0.61–1.24)
Glucose, Bld: 122 mg/dL — ABNORMAL HIGH (ref 70–99)
HCT: 26 % — ABNORMAL LOW (ref 39.0–52.0)
Hemoglobin: 8.8 g/dL — ABNORMAL LOW (ref 13.0–17.0)
Potassium: 4.4 mmol/L (ref 3.5–5.1)
Sodium: 132 mmol/L — ABNORMAL LOW (ref 135–145)
TCO2: 26 mmol/L (ref 22–32)

## 2020-12-25 LAB — POCT I-STAT 7, (LYTES, BLD GAS, ICA,H+H)
Acid-Base Excess: 1 mmol/L (ref 0.0–2.0)
Bicarbonate: 24.6 mmol/L (ref 20.0–28.0)
Calcium, Ion: 1.03 mmol/L — ABNORMAL LOW (ref 1.15–1.40)
HCT: 21 % — ABNORMAL LOW (ref 39.0–52.0)
Hemoglobin: 7.1 g/dL — ABNORMAL LOW (ref 13.0–17.0)
O2 Saturation: 100 %
Potassium: 4.6 mmol/L (ref 3.5–5.1)
Sodium: 133 mmol/L — ABNORMAL LOW (ref 135–145)
TCO2: 26 mmol/L (ref 22–32)
pCO2 arterial: 34.6 mmHg (ref 32.0–48.0)
pH, Arterial: 7.46 — ABNORMAL HIGH (ref 7.350–7.450)
pO2, Arterial: 309 mmHg — ABNORMAL HIGH (ref 83.0–108.0)

## 2020-12-25 LAB — RESP PANEL BY RT-PCR (FLU A&B, COVID) ARPGX2
Influenza A by PCR: NEGATIVE
Influenza B by PCR: NEGATIVE
SARS Coronavirus 2 by RT PCR: NEGATIVE

## 2020-12-25 LAB — COMPREHENSIVE METABOLIC PANEL
ALT: 28 U/L (ref 0–44)
AST: 56 U/L — ABNORMAL HIGH (ref 15–41)
Albumin: 3.2 g/dL — ABNORMAL LOW (ref 3.5–5.0)
Alkaline Phosphatase: 61 U/L (ref 38–126)
Anion gap: 13 (ref 5–15)
BUN: 86 mg/dL — ABNORMAL HIGH (ref 6–20)
CO2: 24 mmol/L (ref 22–32)
Calcium: 8.1 mg/dL — ABNORMAL LOW (ref 8.9–10.3)
Chloride: 96 mmol/L — ABNORMAL LOW (ref 98–111)
Creatinine, Ser: 2.73 mg/dL — ABNORMAL HIGH (ref 0.61–1.24)
GFR, Estimated: 28 mL/min — ABNORMAL LOW (ref >60)
Glucose, Bld: 123 mg/dL — ABNORMAL HIGH (ref 70–99)
Potassium: 4.3 mmol/L (ref 3.5–5.1)
Sodium: 133 mmol/L — ABNORMAL LOW (ref 135–145)
Total Bilirubin: 2.7 mg/dL — ABNORMAL HIGH (ref 0.3–1.2)
Total Protein: 5.6 g/dL — ABNORMAL LOW (ref 6.5–8.1)

## 2020-12-25 LAB — TRAUMA TEG PANEL
CFF Max Amplitude: 28.9 mm (ref 15–32)
Citrated Kaolin (R): 3.6 min — ABNORMAL LOW (ref 4.6–9.1)
Citrated Rapid TEG (MA): 66.9 mm (ref 52–70)
Lysis at 30 Minutes: 0.9 % (ref 0.0–2.6)

## 2020-12-25 LAB — PROTIME-INR
INR: 1.1 (ref 0.8–1.2)
Prothrombin Time: 14.2 seconds (ref 11.4–15.2)

## 2020-12-25 LAB — CK: Total CK: 2116 U/L — ABNORMAL HIGH (ref 49–397)

## 2020-12-25 LAB — HEMOGLOBIN AND HEMATOCRIT, BLOOD
HCT: 31.4 % — ABNORMAL LOW (ref 39.0–52.0)
Hemoglobin: 10.9 g/dL — ABNORMAL LOW (ref 13.0–17.0)

## 2020-12-25 LAB — SAMPLE TO BLOOD BANK

## 2020-12-25 LAB — ETHANOL: Alcohol, Ethyl (B): <10 mg/dL (ref <10)

## 2020-12-25 LAB — LACTIC ACID, PLASMA: Lactic Acid, Venous: 1.6 mmol/L (ref 0.5–1.9)

## 2020-12-25 LAB — ABO/RH: ABO/RH(D): A POS

## 2020-12-25 SURGERY — LAPAROTOMY, EXPLORATORY
Anesthesia: General | Site: Abdomen

## 2020-12-25 MED ORDER — LIDOCAINE HCL (CARDIAC) PF 100 MG/5ML IV SOSY
PREFILLED_SYRINGE | INTRAVENOUS | Status: DC | PRN
  Administered 2020-12-25: 60 mg via INTRAVENOUS

## 2020-12-25 MED ORDER — SODIUM CHLORIDE 0.9 % IV SOLN: INTRAVENOUS | Status: DC

## 2020-12-25 MED ORDER — ONDANSETRON HCL 4 MG/2ML IJ SOLN
INTRAMUSCULAR | Status: DC | PRN
  Administered 2020-12-25: 4 mg via INTRAVENOUS

## 2020-12-25 MED ORDER — TRAZODONE HCL 100 MG PO TABS
100.0000 mg | ORAL_TABLET | Freq: Every day | ORAL | Status: DC
  Administered 2020-12-26 – 2021-01-07 (×12): 100 mg via ORAL
  Filled 2020-12-25 (×3): qty 1
  Filled 2020-12-25 (×2): qty 2
  Filled 2020-12-25 (×2): qty 1
  Filled 2020-12-25 (×3): qty 2
  Filled 2020-12-25 (×4): qty 1

## 2020-12-25 MED ORDER — METHOCARBAMOL 1000 MG/10ML IJ SOLN
1000.0000 mg | Freq: Three times a day (TID) | INTRAVENOUS | Status: DC
  Administered 2020-12-26: 1000 mg via INTRAVENOUS
  Filled 2020-12-25 (×2): qty 10

## 2020-12-25 MED ORDER — ACETAMINOPHEN 325 MG PO TABS: 650.0000 mg | ORAL_TABLET | ORAL | Status: DC | PRN

## 2020-12-25 MED ORDER — RISPERIDONE 1 MG PO TABS
1.0000 mg | ORAL_TABLET | Freq: Every day | ORAL | Status: DC
  Administered 2020-12-26: 1 mg via ORAL
  Filled 2020-12-25: qty 1

## 2020-12-25 MED ORDER — ENOXAPARIN SODIUM 30 MG/0.3ML ~~LOC~~ SOLN: 30.0000 mg | SUBCUTANEOUS | Status: DC

## 2020-12-25 MED ORDER — TETANUS-DIPHTH-ACELL PERTUSSIS 5-2.5-18.5 LF-MCG/0.5 IM SUSY
0.5000 mL | PREFILLED_SYRINGE | Freq: Once | INTRAMUSCULAR | Status: AC
  Administered 2020-12-25: 0.5 mL via INTRAMUSCULAR
  Filled 2020-12-25: qty 0.5

## 2020-12-25 MED ORDER — PROMETHAZINE HCL 25 MG/ML IJ SOLN
INTRAMUSCULAR | Status: AC
  Administered 2020-12-25: 6.25 mg
  Filled 2020-12-25: qty 1

## 2020-12-25 MED ORDER — FENTANYL CITRATE (PF) 100 MCG/2ML IJ SOLN
INTRAMUSCULAR | Status: AC
  Administered 2020-12-25: 50 ug
  Filled 2020-12-25: qty 2

## 2020-12-25 MED ORDER — FENTANYL CITRATE (PF) 100 MCG/2ML IJ SOLN: 25.0000 ug | INTRAMUSCULAR | Status: DC | PRN

## 2020-12-25 MED ORDER — HYDROMORPHONE HCL 1 MG/ML IJ SOLN
0.5000 mg | INTRAMUSCULAR | Status: DC | PRN
  Administered 2020-12-26: 1 mg via INTRAVENOUS
  Filled 2020-12-25: qty 1

## 2020-12-25 MED ORDER — OXYCODONE HCL 5 MG PO TABS: 5.0000 mg | ORAL_TABLET | Freq: Once | ORAL | Status: DC | PRN

## 2020-12-25 MED ORDER — MIDAZOLAM HCL 2 MG/2ML IJ SOLN
INTRAMUSCULAR | Status: AC
  Filled 2020-12-25: qty 2

## 2020-12-25 MED ORDER — ROCURONIUM 10MG/ML (10ML) SYRINGE FOR MEDFUSION PUMP - OPTIME
INTRAVENOUS | Status: DC | PRN
  Administered 2020-12-25: 50 mg via INTRAVENOUS

## 2020-12-25 MED ORDER — 0.9 % SODIUM CHLORIDE (POUR BTL) OPTIME
TOPICAL | Status: DC | PRN
  Administered 2020-12-25 (×2): 1000 mL

## 2020-12-25 MED ORDER — SUGAMMADEX SODIUM 200 MG/2ML IV SOLN
INTRAVENOUS | Status: DC | PRN
  Administered 2020-12-25: 200 mg via INTRAVENOUS

## 2020-12-25 MED ORDER — SODIUM CHLORIDE 0.9 % IV SOLN: INTRAVENOUS | Status: DC | PRN

## 2020-12-25 MED ORDER — OXYCODONE HCL 5 MG PO TABS
10.0000 mg | ORAL_TABLET | ORAL | Status: DC | PRN
  Administered 2020-12-26 (×2): 10 mg via ORAL
  Filled 2020-12-25 (×2): qty 2

## 2020-12-25 MED ORDER — PROPOFOL 10 MG/ML IV BOLUS
INTRAVENOUS | Status: DC | PRN
  Administered 2020-12-25: 140 mg via INTRAVENOUS

## 2020-12-25 MED ORDER — OXYCODONE HCL 5 MG/5ML PO SOLN: 5.0000 mg | Freq: Once | ORAL | Status: DC | PRN

## 2020-12-25 MED ORDER — FENTANYL CITRATE (PF) 250 MCG/5ML IJ SOLN
INTRAMUSCULAR | Status: DC | PRN
  Administered 2020-12-25: 50 ug via INTRAVENOUS
  Administered 2020-12-25 (×2): 100 ug via INTRAVENOUS

## 2020-12-25 MED ORDER — HYDROMORPHONE HCL 1 MG/ML IJ SOLN
INTRAMUSCULAR | Status: AC
  Filled 2020-12-25: qty 1

## 2020-12-25 MED ORDER — IOHEXOL 350 MG/ML SOLN
100.0000 mL | Freq: Once | INTRAVENOUS | Status: AC | PRN
  Administered 2020-12-25: 100 mL via INTRAVENOUS

## 2020-12-25 MED ORDER — LAMOTRIGINE 25 MG PO TABS
50.0000 mg | ORAL_TABLET | Freq: Every day | ORAL | Status: DC
  Filled 2020-12-25: qty 2

## 2020-12-25 MED ORDER — SUCCINYLCHOLINE 20MG/ML (10ML) SYRINGE FOR MEDFUSION PUMP - OPTIME
INTRAMUSCULAR | Status: DC | PRN
  Administered 2020-12-25: 90 mg via INTRAVENOUS

## 2020-12-25 MED ORDER — FENTANYL CITRATE (PF) 250 MCG/5ML IJ SOLN
INTRAMUSCULAR | Status: AC
  Filled 2020-12-25: qty 5

## 2020-12-25 MED ORDER — OXYCODONE HCL 5 MG PO TABS
5.0000 mg | ORAL_TABLET | ORAL | Status: DC | PRN
  Administered 2020-12-30: 5 mg via ORAL
  Filled 2020-12-25 (×2): qty 1

## 2020-12-25 MED ORDER — HYDROMORPHONE HCL 1 MG/ML IJ SOLN
INTRAMUSCULAR | Status: DC | PRN
  Administered 2020-12-25: .5 mg via INTRAVENOUS

## 2020-12-25 MED ORDER — DEXAMETHASONE SODIUM PHOSPHATE 10 MG/ML IJ SOLN
INTRAMUSCULAR | Status: DC | PRN
  Administered 2020-12-25: 10 mg via INTRAVENOUS

## 2020-12-25 MED ORDER — HYDROMORPHONE HCL 1 MG/ML IJ SOLN
INTRAMUSCULAR | Status: AC
  Filled 2020-12-25: qty 0.5

## 2020-12-25 MED ORDER — RISPERIDONE 2 MG PO TABS
2.0000 mg | ORAL_TABLET | Freq: Every day | ORAL | Status: DC
  Administered 2020-12-26: 2 mg via ORAL
  Filled 2020-12-25 (×2): qty 1

## 2020-12-25 MED ORDER — MIRTAZAPINE 15 MG PO TABS
15.0000 mg | ORAL_TABLET | Freq: Every day | ORAL | Status: DC
  Administered 2020-12-26 (×2): 15 mg via ORAL
  Filled 2020-12-25 (×2): qty 1

## 2020-12-25 MED ORDER — METOPROLOL TARTRATE 5 MG/5ML IV SOLN: 5.0000 mg | Freq: Four times a day (QID) | INTRAVENOUS | Status: DC | PRN

## 2020-12-25 MED ORDER — MIDAZOLAM HCL 2 MG/2ML IJ SOLN
INTRAMUSCULAR | Status: DC | PRN
  Administered 2020-12-25: 2 mg via INTRAVENOUS

## 2020-12-25 MED ORDER — GABAPENTIN 300 MG PO CAPS
300.0000 mg | ORAL_CAPSULE | Freq: Three times a day (TID) | ORAL | Status: DC
  Administered 2020-12-26 – 2021-01-09 (×41): 300 mg via ORAL
  Filled 2020-12-25 (×42): qty 1

## 2020-12-25 MED ORDER — ACETAMINOPHEN 10 MG/ML IV SOLN
INTRAVENOUS | Status: AC
  Administered 2020-12-25: 1000 mg
  Filled 2020-12-25: qty 100

## 2020-12-25 MED ORDER — ONDANSETRON HCL 4 MG/2ML IJ SOLN: 4.0000 mg | Freq: Four times a day (QID) | INTRAMUSCULAR | Status: DC | PRN

## 2020-12-25 MED ORDER — HYDROMORPHONE HCL 1 MG/ML IJ SOLN
0.2500 mg | INTRAMUSCULAR | Status: DC | PRN
  Administered 2020-12-25: 0.5 mg via INTRAVENOUS
  Administered 2020-12-25: 0.25 mg via INTRAVENOUS

## 2020-12-25 MED ORDER — ONDANSETRON 4 MG PO TBDP: 4.0000 mg | ORAL_TABLET | Freq: Four times a day (QID) | ORAL | Status: DC | PRN

## 2020-12-25 MED ORDER — ACETAMINOPHEN 160 MG/5ML PO SOLN: 1000.0000 mg | Freq: Once | ORAL | Status: DC | PRN

## 2020-12-25 MED ORDER — CEFAZOLIN SODIUM-DEXTROSE 2-4 GM/100ML-% IV SOLN
2.0000 g | Freq: Once | INTRAVENOUS | Status: AC
  Administered 2020-12-25: 2 g via INTRAVENOUS
  Filled 2020-12-25: qty 100

## 2020-12-25 MED ORDER — ACETAMINOPHEN 10 MG/ML IV SOLN: 1000.0000 mg | Freq: Once | INTRAVENOUS | Status: DC | PRN

## 2020-12-25 MED ORDER — ACETAMINOPHEN 500 MG PO TABS: 1000.0000 mg | ORAL_TABLET | Freq: Once | ORAL | Status: DC | PRN

## 2020-12-25 MED ORDER — LACTATED RINGERS IV SOLN: INTRAVENOUS | Status: DC | PRN

## 2020-12-25 MED ORDER — CHLORHEXIDINE GLUCONATE CLOTH 2 % EX PADS
6.0000 | MEDICATED_PAD | Freq: Every day | CUTANEOUS | Status: DC
  Administered 2020-12-26 – 2021-01-09 (×13): 6 via TOPICAL

## 2020-12-25 SURGICAL SUPPLY — 48 items
BLADE CLIPPER SURG (BLADE) ×3 IMPLANT
BNDG GAUZE ELAST 4 BULKY (GAUZE/BANDAGES/DRESSINGS) ×6 IMPLANT
CATH FOLEY 2WAY SLVR  5CC 16FR (CATHETERS) ×1
CATH FOLEY 2WAY SLVR 5CC 16FR (CATHETERS) ×2 IMPLANT
COVER SURGICAL LIGHT HANDLE (MISCELLANEOUS) ×3 IMPLANT
DRAPE LAPAROSCOPIC ABDOMINAL (DRAPES) ×3 IMPLANT
DRAPE WARM FLUID 44X44 (DRAPES) ×3 IMPLANT
DRSG ADAPTIC 3X8 NADH LF (GAUZE/BANDAGES/DRESSINGS) ×3 IMPLANT
ELECT BLADE 6.5 EXT (BLADE) ×3 IMPLANT
ELECT CAUTERY BLADE 6.4 (BLADE) ×3 IMPLANT
ELECT REM PT RETURN 9FT ADLT (ELECTROSURGICAL) ×3
ELECTRODE REM PT RTRN 9FT ADLT (ELECTROSURGICAL) ×2 IMPLANT
GAUZE SPONGE 2X2 8PLY STRL LF (GAUZE/BANDAGES/DRESSINGS) ×4 IMPLANT
GAUZE SPONGE 4X4 12PLY STRL (GAUZE/BANDAGES/DRESSINGS) ×6 IMPLANT
GLOVE BIO SURGEON STRL SZ8 (GLOVE) ×6 IMPLANT
GLOVE SRG 8 PF TXTR STRL LF DI (GLOVE) ×2 IMPLANT
GLOVE SURG UNDER POLY LF SZ8 (GLOVE) ×3
GOWN STRL REUS W/ TWL LRG LVL3 (GOWN DISPOSABLE) ×2 IMPLANT
GOWN STRL REUS W/ TWL XL LVL3 (GOWN DISPOSABLE) ×4 IMPLANT
GOWN STRL REUS W/TWL LRG LVL3 (GOWN DISPOSABLE) ×3
GOWN STRL REUS W/TWL XL LVL3 (GOWN DISPOSABLE) ×6
HANDLE SUCTION POOLE (INSTRUMENTS) ×2 IMPLANT
KIT BASIN OR (CUSTOM PROCEDURE TRAY) ×3 IMPLANT
KIT TURNOVER KIT B (KITS) ×3 IMPLANT
NS IRRIG 1000ML POUR BTL (IV SOLUTION) ×15 IMPLANT
PACK GENERAL/GYN (CUSTOM PROCEDURE TRAY) ×3 IMPLANT
PAD ARMBOARD 7.5X6 YLW CONV (MISCELLANEOUS) ×3 IMPLANT
PENCIL SMOKE EVACUATOR (MISCELLANEOUS) ×3 IMPLANT
SPONGE GAUZE 2X2 STER 10/PKG (GAUZE/BANDAGES/DRESSINGS) ×2
SPONGE LAP 18X18 RF (DISPOSABLE) ×6 IMPLANT
STAPLER VISISTAT 35W (STAPLE) ×3 IMPLANT
SUCTION POOLE HANDLE (INSTRUMENTS) ×3
SUT ETHILON 3 0 FSL (SUTURE) ×15 IMPLANT
SUT PDS AB 1 TP1 96 (SUTURE) ×6 IMPLANT
SUT SILK 2 0 SH CR/8 (SUTURE) ×3 IMPLANT
SUT SILK 2 0 TIES 10X30 (SUTURE) ×3 IMPLANT
SUT SILK 3 0 SH CR/8 (SUTURE) ×3 IMPLANT
SUT SILK 3 0 TIES 10X30 (SUTURE) ×3 IMPLANT
SUT VIC AB 0 CT1 27 (SUTURE) ×3
SUT VIC AB 0 CT1 27XBRD ANBCTR (SUTURE) ×2 IMPLANT
SUT VIC AB 2-0 SH 27 (SUTURE) ×12
SUT VIC AB 2-0 SH 27X BRD (SUTURE) ×8 IMPLANT
SUT VIC AB 3-0 SH 18 (SUTURE) ×3 IMPLANT
SUT VIC AB 3-0 SH 27 (SUTURE) ×12
SUT VIC AB 3-0 SH 27X BRD (SUTURE) ×2 IMPLANT
SUT VIC AB 3-0 SH 27XBRD (SUTURE) ×6 IMPLANT
SYR CONTROL 10ML LL (SYRINGE) ×3 IMPLANT
TOWEL GREEN STERILE (TOWEL DISPOSABLE) ×3 IMPLANT
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
## 2020-12-25 NOTE — ED Provider Notes (Signed)
ATTENDING SUPERVISORY NOTE I have personally viewed the imaging studies performed. I have personally seen and examined the patient, and discussed the plan of care with the resident.  I have reviewed the documentation of the resident and agree.  Trauma - Plan: DG Chest Port 1 [address] DG Chest Port 1 View, CT C-SPINE NO CHARGE, CT C-SPINE NO CHARGE  .Critical Care Performed by: Blane Ohara, MD Authorized by: Blane Ohara, MD   Critical care provider statement:    Critical care time (minutes):  35   Critical care start time:  12/25/2020 5:05 PM   Critical care end time:  12/25/2020 5:40 PM   Critical care time was exclusive of:  Separately billable procedures and treating other patients and teaching time   Critical care was necessary to treat or prevent imminent or life-threatening deterioration of the following conditions:  Trauma   Critical care was time spent personally by me on the following activities:  Discussions with consultants, evaluation of patient's response to treatment, examination of patient, ordering and performing treatments and interventions, ordering and review of laboratory studies, ordering and review of radiographic studies, pulse oximetry, re-evaluation of patient's condition and review of old charts      Blane Ohara, MD 01/01/21 1501
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
## 2020-12-25 NOTE — Progress Notes (Addendum)
   12/25/20 1710  Clinical Encounter Type  Visited With Family  Visit Type Trauma  Referral From Nurse  Consult/Referral To Chaplain  Chaplain responded. I greeted the patient's mother and father in the ED waiting area. Informed them med staff will speak with them shortly. The patient's mother, Sean Shepherd appeared to be anxious. I offered prayer and comfort. Advised I will be available if needed later during their visit. This note was prepared by Deneen Harts, M.Div..  For questions please contact by phone [phone].

## 2020-12-25 NOTE — Transfer of Care (Signed)
Immediate Anesthesia Transfer of Care Note  Patient: Sean Shepherd  Procedure(s) Performed: EXPLORATORY LAPAROTOMY; REPAIR OF GROIN LACERATIONS (N/A Abdomen)  Patient Location: PACU  Anesthesia Type:General  Level of Consciousness: sedated  Airway & Oxygen Therapy: Patient Spontanous Breathing  Post-op Assessment: Report given to RN and Post -op Vital signs reviewed and stable  Post vital signs: Reviewed and stable  Last Vitals:  Vitals Value Taken Time  BP    Temp    Pulse    Resp    SpO2      Last Pain:  Vitals:   12/25/20 1908  TempSrc:   PainSc: 7       Patients Stated Pain Goal: 3 (12/25/20 1908)  Complications: No complications documented.
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
## 2020-12-25 NOTE — Anesthesia Procedure Notes (Signed)
Procedure Name: Intubation Date/Time: 12/25/2020 7:18 PM Performed by: Molli Hazard, CRNA Pre-anesthesia Checklist: Patient identified, Emergency Drugs available, Suction available and Patient being monitored Patient Re-evaluated:Patient Re-evaluated prior to induction Oxygen Delivery Method: Circle system utilized Preoxygenation: Pre-oxygenation with 100% oxygen Induction Type: IV induction, Rapid sequence and Cricoid Pressure applied Laryngoscope Size: Miller and 2 Grade View: Grade I Tube size: 7.5 mm Number of attempts: 1 Airway Equipment and Method: Stylet Placement Confirmation: ETT inserted through vocal cords under direct vision,  positive ETCO2 and breath sounds checked- equal and bilateral Secured at: 23 cm Tube secured with: Tape Dental Injury: Teeth and Oropharynx as per pre-operative assessment
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
## 2020-12-26 ENCOUNTER — Encounter (HOSPITAL_COMMUNITY): Payer: Self-pay | Admitting: General Surgery

## 2020-12-26 ENCOUNTER — Telehealth (HOSPITAL_COMMUNITY): Payer: Self-pay | Admitting: Physician Assistant

## 2020-12-26 DIAGNOSIS — F250 Schizoaffective disorder, bipolar type: Secondary | ICD-10-CM

## 2020-12-26 DIAGNOSIS — E43 Unspecified severe protein-calorie malnutrition: Secondary | ICD-10-CM | POA: Insufficient documentation

## 2020-12-26 LAB — TYPE AND SCREEN
ABO/RH(D): A POS
Antibody Screen: NEGATIVE
Unit division: 0
Unit division: 0

## 2020-12-26 LAB — HIV ANTIBODY (ROUTINE TESTING W REFLEX): HIV Screen 4th Generation wRfx: NONREACTIVE

## 2020-12-26 LAB — BASIC METABOLIC PANEL
Anion gap: 11 (ref 5–15)
BUN: 61 mg/dL — ABNORMAL HIGH (ref 6–20)
CO2: 24 mmol/L (ref 22–32)
Calcium: 7.7 mg/dL — ABNORMAL LOW (ref 8.9–10.3)
Chloride: 101 mmol/L (ref 98–111)
Creatinine, Ser: 1.71 mg/dL — ABNORMAL HIGH (ref 0.61–1.24)
GFR, Estimated: 50 mL/min — ABNORMAL LOW (ref >60)
Glucose, Bld: 122 mg/dL — ABNORMAL HIGH (ref 70–99)
Potassium: 4.3 mmol/L (ref 3.5–5.1)
Sodium: 136 mmol/L (ref 135–145)

## 2020-12-26 LAB — BPAM RBC
Blood Product Expiration Date: 202205182359
Blood Product Expiration Date: 202205192359
ISSUE DATE / TIME: 202204251957
ISSUE DATE / TIME: 202204251957
Unit Type and Rh: 6200
Unit Type and Rh: 6200

## 2020-12-26 LAB — CBC
HCT: 30.2 % — ABNORMAL LOW (ref 39.0–52.0)
Hemoglobin: 10.6 g/dL — ABNORMAL LOW (ref 13.0–17.0)
MCH: 30 pg (ref 26.0–34.0)
MCHC: 35.1 g/dL (ref 30.0–36.0)
MCV: 85.6 fL (ref 80.0–100.0)
Platelets: 166 10*3/uL (ref 150–400)
RBC: 3.53 MIL/uL — ABNORMAL LOW (ref 4.22–5.81)
RDW: 13.5 % (ref 11.5–15.5)
WBC: 18.2 10*3/uL — ABNORMAL HIGH (ref 4.0–10.5)
nRBC: 0 % (ref 0.0–0.2)

## 2020-12-26 LAB — MRSA PCR SCREENING: MRSA by PCR: NEGATIVE

## 2020-12-26 MED ORDER — ENOXAPARIN SODIUM 30 MG/0.3ML ~~LOC~~ SOLN
30.0000 mg | Freq: Two times a day (BID) | SUBCUTANEOUS | Status: DC
  Administered 2020-12-27 – 2021-01-09 (×27): 30 mg via SUBCUTANEOUS
  Filled 2020-12-26 (×27): qty 0.3

## 2020-12-26 MED ORDER — ENSURE ENLIVE PO LIQD
237.0000 mL | Freq: Three times a day (TID) | ORAL | Status: DC
  Administered 2020-12-26 – 2021-01-08 (×27): 237 mL via ORAL
  Filled 2020-12-26 (×2): qty 237

## 2020-12-26 MED ORDER — ACETAMINOPHEN 500 MG PO TABS
1000.0000 mg | ORAL_TABLET | Freq: Four times a day (QID) | ORAL | Status: DC
  Administered 2020-12-26 – 2021-01-09 (×35): 1000 mg via ORAL
  Filled 2020-12-26 (×44): qty 2

## 2020-12-26 MED ORDER — ADULT MULTIVITAMIN W/MINERALS CH
1.0000 | ORAL_TABLET | Freq: Every day | ORAL | Status: DC
  Administered 2020-12-26 – 2021-01-09 (×15): 1 via ORAL
  Filled 2020-12-26 (×15): qty 1

## 2020-12-26 MED ORDER — METHOCARBAMOL 500 MG PO TABS
1000.0000 mg | ORAL_TABLET | Freq: Three times a day (TID) | ORAL | Status: DC
  Administered 2020-12-26 – 2021-01-09 (×33): 1000 mg via ORAL
  Filled 2020-12-26 (×40): qty 2

## 2020-12-26 MED ORDER — ENSURE ENLIVE PO LIQD: 237.0000 mL | Freq: Two times a day (BID) | ORAL | Status: DC

## 2020-12-26 MED ORDER — PALIPERIDONE ER 6 MG PO TB24
6.0000 mg | ORAL_TABLET | Freq: Every day | ORAL | Status: DC
  Administered 2020-12-26 – 2020-12-27 (×2): 6 mg via ORAL
  Filled 2020-12-26 (×4): qty 1

## 2020-12-26 MED ORDER — HYDROMORPHONE HCL 1 MG/ML IJ SOLN: 0.5000 mg | Freq: Four times a day (QID) | INTRAMUSCULAR | Status: DC | PRN

## 2020-12-26 NOTE — Telephone Encounter (Signed)
Provider was contacted by [employer] regarding patient's mother's message. Provider will reach out to patient's mother at earliest convenience.
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
## 2020-12-26 NOTE — TOC CAGE-AID Note (Signed)
Transition of Care [facility]) - CAGE-AID Screening   Patient Details  Name: Sean Shepherd MRN: [id number] Date of Birth: 09-13-1975  [facility]) CM/SW Contact:    Janora Norlander, RN Phone Number: [phone] 12/26/2020, 10:53 AM   Clinical Narrative: Pt states he quit drinking alcohol 5 years ago and denies drug use.   CAGE-AID Screening:    Have You Ever Felt You Ought to Cut Down on Your Drinking or Drug Use?: No Have People Annoyed You By Critizing Your Drinking Or Drug Use?: No Have You Felt Bad Or Guilty About Your Drinking Or Drug Use?: No Have You Ever Had a Drink or Used Drugs First Thing In The Morning to Steady Your Nerves or to Get Rid of a Hangover?: No CAGE-AID Score: 0  Substance Abuse Education Offered: No
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
## 2020-12-26 NOTE — ED Notes (Signed)
Pt became nauseated in CT. VRBO from MD [name] for 4mg  IV Zofran received. Pt given 4mg  Zofran.
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
## 2020-12-27 DIAGNOSIS — F411 Generalized anxiety disorder: Secondary | ICD-10-CM

## 2020-12-27 LAB — CBC
HCT: 26.8 % — ABNORMAL LOW (ref 39.0–52.0)
Hemoglobin: 9 g/dL — ABNORMAL LOW (ref 13.0–17.0)
MCH: 29.8 pg (ref 26.0–34.0)
MCHC: 33.6 g/dL (ref 30.0–36.0)
MCV: 88.7 fL (ref 80.0–100.0)
Platelets: 158 10*3/uL (ref 150–400)
RBC: 3.02 MIL/uL — ABNORMAL LOW (ref 4.22–5.81)
RDW: 14.2 % (ref 11.5–15.5)
WBC: 9.5 10*3/uL (ref 4.0–10.5)
nRBC: 0 % (ref 0.0–0.2)

## 2020-12-27 LAB — BASIC METABOLIC PANEL
Anion gap: 6 (ref 5–15)
BUN: 23 mg/dL — ABNORMAL HIGH (ref 6–20)
CO2: 29 mmol/L (ref 22–32)
Calcium: 8.2 mg/dL — ABNORMAL LOW (ref 8.9–10.3)
Chloride: 101 mmol/L (ref 98–111)
Creatinine, Ser: 0.91 mg/dL (ref 0.61–1.24)
GFR, Estimated: >60 mL/min (ref >60)
Glucose, Bld: 107 mg/dL — ABNORMAL HIGH (ref 70–99)
Potassium: 4.3 mmol/L (ref 3.5–5.1)
Sodium: 136 mmol/L (ref 135–145)

## 2020-12-27 MED ORDER — MIRTAZAPINE 15 MG PO TABS
30.0000 mg | ORAL_TABLET | Freq: Every day | ORAL | Status: DC
  Administered 2020-12-27 – 2021-01-08 (×13): 30 mg via ORAL
  Filled 2020-12-27 (×13): qty 2
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
## 2020-12-28 LAB — CBC
HCT: 26.1 % — ABNORMAL LOW (ref 39.0–52.0)
Hemoglobin: 8.7 g/dL — ABNORMAL LOW (ref 13.0–17.0)
MCH: 29.8 pg (ref 26.0–34.0)
MCHC: 33.3 g/dL (ref 30.0–36.0)
MCV: 89.4 fL (ref 80.0–100.0)
Platelets: 159 10*3/uL (ref 150–400)
RBC: 2.92 MIL/uL — ABNORMAL LOW (ref 4.22–5.81)
RDW: 14 % (ref 11.5–15.5)
WBC: 6.4 10*3/uL (ref 4.0–10.5)
nRBC: 0 % (ref 0.0–0.2)

## 2020-12-28 LAB — BASIC METABOLIC PANEL
Anion gap: 7 (ref 5–15)
BUN: 16 mg/dL (ref 6–20)
CO2: 30 mmol/L (ref 22–32)
Calcium: 8.5 mg/dL — ABNORMAL LOW (ref 8.9–10.3)
Chloride: 102 mmol/L (ref 98–111)
Creatinine, Ser: 0.77 mg/dL (ref 0.61–1.24)
GFR, Estimated: >60 mL/min (ref >60)
Glucose, Bld: 104 mg/dL — ABNORMAL HIGH (ref 70–99)
Potassium: 4.3 mmol/L (ref 3.5–5.1)
Sodium: 139 mmol/L (ref 135–145)

## 2020-12-28 MED ORDER — PALIPERIDONE ER 6 MG PO TB24
9.0000 mg | ORAL_TABLET | Freq: Every day | ORAL | Status: DC
  Administered 2020-12-28 – 2021-01-08 (×12): 9 mg via ORAL
  Filled 2020-12-28 (×15): qty 1
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
## 2020-12-28 NOTE — Progress Notes (Addendum)
Responded to page to support patient.  Patient wanted  To accept Jesus Christ as Lord and [employer]. [place] asked that I lead him in the plan of Salvation. Request was honored and Engineer, petroleum accepted [address]. We prayed togather and  [place] said he felt better and was ready to start a new life with Christ.  Will follow as needed.  Venida Jarvis, [place], [facility], Pager [phone]

## 2020-12-29 DIAGNOSIS — F332 Major depressive disorder, recurrent severe without psychotic features: Secondary | ICD-10-CM

## 2020-12-29 LAB — BASIC METABOLIC PANEL
Anion gap: 5 (ref 5–15)
BUN: 14 mg/dL (ref 6–20)
CO2: 31 mmol/L (ref 22–32)
Calcium: 8.4 mg/dL — ABNORMAL LOW (ref 8.9–10.3)
Chloride: 103 mmol/L (ref 98–111)
Creatinine, Ser: 0.76 mg/dL (ref 0.61–1.24)
GFR, Estimated: >60 mL/min (ref >60)
Glucose, Bld: 120 mg/dL — ABNORMAL HIGH (ref 70–99)
Potassium: 3.7 mmol/L (ref 3.5–5.1)
Sodium: 139 mmol/L (ref 135–145)

## 2020-12-29 LAB — CBC
HCT: 28.1 % — ABNORMAL LOW (ref 39.0–52.0)
Hemoglobin: 9.2 g/dL — ABNORMAL LOW (ref 13.0–17.0)
MCH: 29.6 pg (ref 26.0–34.0)
MCHC: 32.7 g/dL (ref 30.0–36.0)
MCV: 90.4 fL (ref 80.0–100.0)
Platelets: 212 10*3/uL (ref 150–400)
RBC: 3.11 MIL/uL — ABNORMAL LOW (ref 4.22–5.81)
RDW: 13.5 % (ref 11.5–15.5)
WBC: 7.4 10*3/uL (ref 4.0–10.5)
nRBC: 0 % (ref 0.0–0.2)

## 2020-12-29 MED ORDER — POLYETHYLENE GLYCOL 3350 17 G PO PACK
17.0000 g | PACK | Freq: Every day | ORAL | Status: DC
  Administered 2020-12-29 – 2021-01-08 (×11): 17 g via ORAL
  Filled 2020-12-29 (×12): qty 1

## 2020-12-29 MED ORDER — DOCUSATE SODIUM 100 MG PO CAPS
100.0000 mg | ORAL_CAPSULE | Freq: Two times a day (BID) | ORAL | Status: DC
  Administered 2020-12-29 – 2021-01-09 (×23): 100 mg via ORAL
  Filled 2020-12-29 (×23): qty 1
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
## 2020-12-29 NOTE — Anesthesia Postprocedure Evaluation (Signed)
Anesthesia Post Note  Patient: Sean Shepherd  Procedure(s) Performed: EXPLORATORY LAPAROTOMY; REPAIR OF GROIN LACERATIONS X2, RIGHT THIGH LACERATION X 2; REPAIR RIGHT NECK AND LEFT NECK LACERATION AND REPAIR OF LEFT WRIST LACERATION (N/A Abdomen) EXPLORATION OF PENOSCROTAL WOUND; LIGATURE OF LEFT SPERMATIC CORD; CLOSURE OF PENILE AMPUTATION STUMP; CUTANEOUS URETHROSTOMY CREATION     Patient location during evaluation: PACU Anesthesia Type: General Level of consciousness: patient cooperative and awake Pain management: pain level controlled Vital Signs Assessment: post-procedure vital signs reviewed and stable Respiratory status: spontaneous breathing, nonlabored ventilation, respiratory function stable and patient connected to nasal cannula oxygen Cardiovascular status: blood pressure returned to baseline and stable Postop Assessment: no apparent nausea or vomiting Anesthetic complications: no   No complications documented.  Last Vitals:  Vitals:   12/29/20 0400 12/29/20 0500  BP: 118/65 123/72  Pulse: 88 86  Resp: 11 13  Temp:    SpO2: 95% 95%    Last Pain:  Vitals:   12/29/20 0400  TempSrc:   PainSc: Asleep                 [name]
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
## 2020-12-30 NOTE — TOC Progression Note (Signed)
Transition of Care [facility]) - Progression Note    Patient Details  Name: Sean Shepherd MRN: [id number] Date of Birth: 03-10-1976  Transition of Care [facility]) CM/SW Contact  Annalee Genta, LCSW Phone Number: 12/30/2020, 9:22 AM  Clinical Narrative:    CSW received handoff requesting follow-up on [facility] referral. CSW received fax confirmation from late last night. CSW contacted [facility] admissions and confirmed receipt. CSW initiated verbal step of referral and is awaiting a call back from [facility] RN to confirm referral status. Will update note throughout day as it progresses.    Expected Discharge Plan: Psychiatric Hospital    Expected Discharge Plan and Services Expected Discharge Plan: Psychiatric Hospital In-house Referral: Clinical Social Work Discharge Planning Services: CM Consult Post Acute Care Choice: NA Living arrangements for the past 2 months: Single Family Home                                       Social Determinants of Health (SDOH) Interventions    Readmission Risk Interventions No flowsheet data found.

## 2020-12-30 NOTE — Progress Notes (Signed)
Was transferred on a wheelchair by Lambert Mody, Charity fundraiser and this Clinical research associate. Pt transferred from 4N ICU RM 16 to 6N RM 6. Will continue to monitor pt.
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
## 2020-12-30 NOTE — Progress Notes (Signed)
Pt's mother and sister is visiting pt. Will continue to monitor pt.

## 2020-12-30 NOTE — Progress Notes (Signed)
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  Labs stabilized Good UOP     ROS:  Review of Systems  Constitutional: Negative for chills and fever.    Anti-infectives: Anti-infectives (From admission, onward)   Start     Dose/Rate Route Frequency Ordered Stop   12/25/20 1715  ceFAZolin (ANCEF) IVPB 2g/100 mL premix        2 g 200 mL/hr over 30 Minutes Intravenous  Once 12/25/20 1705 12/25/20 1844      Current Facility-Administered Medications  Medication Dose Route Frequency Provider Last Rate Last Admin  . acetaminophen (TYLENOL) tablet 1,000 mg  1,000 mg Oral Q6H Diamantina Monks, MD   1,000 mg at 12/30/20 0816  . Chlorhexidine Gluconate Cloth 2 % PADS 6 each  6 each Topical Daily [name], MD   6 each at 12/29/20 1100  . docusate sodium (COLACE) capsule 100 mg  100 mg Oral BID [name], MD   100 mg at 12/30/20 1001  . enoxaparin (LOVENOX) injection 30 mg  30 mg Subcutaneous Q12H Diamantina Monks, MD   30 mg at 12/30/20 1002  . feeding supplement (ENSURE ENLIVE / ENSURE PLUS) liquid 237 mL  237 mL Oral TID BM Diamantina Monks, MD   237 mL at 12/30/20 1003  . gabapentin (NEURONTIN) capsule 300 mg  300 mg Oral TID [name], MD   300 mg at 12/30/20 1002  . HYDROmorphone (DILAUDID) injection 0.5 mg  0.5 mg Intravenous Q6H PRN Diamantina Monks, MD      . methocarbamol (ROBAXIN) tablet 1,000 mg  1,000 mg Oral Q8H Diamantina Monks, MD   1,000 mg at 12/30/20 0517  . metoprolol tartrate (LOPRESSOR) injection 5 mg  5 mg Intravenous Q6H PRN [name], MD      . mirtazapine (REMERON) tablet 30 mg  30 mg Oral QHS Karsten Ro, MD   30 mg at 12/29/20 2141  . multivitamin with minerals tablet 1 tablet  1 tablet Oral Daily Diamantina Monks, MD   1 tablet at 12/30/20 1001  . ondansetron (ZOFRAN-ODT)  disintegrating tablet 4 mg  4 mg Oral Q6H PRN [name], MD       Or  . ondansetron [facility]) injection 4 mg  4 mg Intravenous Q6H PRN [name], MD      . oxyCODONE (Oxy IR/ROXICODONE) immediate release tablet 10 mg  10 mg Oral Q4H PRN [name], MD   10 mg at 12/26/20 1740  . oxyCODONE (Oxy IR/ROXICODONE) immediate release tablet 5 mg  5 mg Oral Q4H PRN [name], MD      . paliperidone (INVEGA) 24 hr tablet 9 mg  9 mg Oral QHS Karsten Ro, MD   9 mg at 12/29/20 2142  . polyethylene glycol (MIRALAX / GLYCOLAX) packet 17 g  17 g Oral Daily [name], MD   17 g at 12/30/20 1002  . traZODone (DESYREL) tablet 100 mg  100 mg Oral QHS [name], MD   100 mg at 12/29/20 2141     Objective: Vital signs in last 24 hours: Temp:  [98.7 F (37.1 C)-99.4 F (37.4 C)] 99.3 F (37.4 C) (04/30 1341) Pulse Rate:  [86-109] 97 (04/30 1341) Resp:  [10-28] 18 (04/30 1341) BP: (94-133)/(56-84) 130/70 (04/30 1341) SpO2:  [91 %-99 %] 99 % (04/30 1341)  Intake/Output from previous day: 04/29 0701 - 04/30 0700 In: 1380 [P.O.:1380] Out: 1500 [Urine:1500] Intake/Output this shift: Total I/O In: 400 [P.O.:120; Other:280] Out: 1250 [Urine:1250]   Physical Exam Vitals reviewed.  Constitutional:      Appearance: Normal appearance.  Genitourinary:    Comments: Foley is in good position and the cutaneous urethrostomy is healthy in appearance and the scrotal repair is intact.   Neurological:     Mental Status: He is alert.  Psychiatric:        Mood and Affect: Mood normal.     Lab Results:  Recent Labs    12/28/20 0302 12/29/20 0227  WBC 6.4 7.4  HGB 8.7* 9.2*  HCT 26.1* 28.1*  PLT 159 212   BMET Recent Labs    12/28/20 0302 12/29/20 0227  NA 139 139  K 4.3 3.7  CL 102 103  CO2 30 31  GLUCOSE 104* 120*  BUN 16 14  CREATININE 0.77 0.76  CALCIUM 8.5* 8.4*   PT/INR No results for input(s): LABPROT, INR in the last 72 hours. ABG No results for  input(s): PHART, HCO3 in the last 72 hours.  Invalid input(s): PCO2, PO2  Studies/Results: No results found.   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  - continue foley catheter for 2 weeks post-op - dressing changes and wound care as needed to keep scrotum clean and dry - eventually will need consideration of a perineal urethrostomy but he might be able to void successfully standing with a urinal - plan for referral to a reconstructive urologist, either Dr. Guy Shepherd at [facility] or Dr. Gillian Shepherd at [facility].      LOS: 5 days    Sean Shepherd 12/30/2020 [phone] ID: Sean Shepherd, male   DOB: 13-Jul-1976, 45 y.o.   MRN: [id number] Patient ID: Sean Shepherd, male   DOB: 12-03-1975, 45 y.o.   MRN: [id number]
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
## 2021-01-01 MED ORDER — BENZTROPINE MESYLATE 0.5 MG PO TABS
0.5000 mg | ORAL_TABLET | Freq: Every day | ORAL | Status: DC
  Administered 2021-01-01 – 2021-01-02 (×2): 0.5 mg via ORAL
  Filled 2021-01-01 (×2): qty 1

## 2021-01-01 MED ORDER — HALOPERIDOL 1 MG PO TABS
2.0000 mg | ORAL_TABLET | Freq: Two times a day (BID) | ORAL | Status: DC
  Administered 2021-01-01 – 2021-01-02 (×3): 2 mg via ORAL
  Filled 2021-01-01 (×3): qty 2
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
## 2021-01-02 MED ORDER — HALOPERIDOL 5 MG PO TABS
5.0000 mg | ORAL_TABLET | Freq: Two times a day (BID) | ORAL | Status: DC
  Administered 2021-01-02: 5 mg via ORAL
  Filled 2021-01-02 (×2): qty 1

## 2021-01-02 MED ORDER — OXYMETAZOLINE HCL 0.05 % NA SOLN
1.0000 | Freq: Two times a day (BID) | NASAL | Status: AC | PRN
  Filled 2021-01-02: qty 30

## 2021-01-02 MED ORDER — HALOPERIDOL 1 MG PO TABS
2.0000 mg | ORAL_TABLET | Freq: Once | ORAL | Status: AC
  Administered 2021-01-02: 2 mg via ORAL
  Filled 2021-01-02: qty 2
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
## 2021-01-03 MED ORDER — BENZTROPINE MESYLATE 1 MG PO TABS
1.0000 mg | ORAL_TABLET | Freq: Every day | ORAL | Status: DC
  Administered 2021-01-03 – 2021-01-08 (×6): 1 mg via ORAL
  Filled 2021-01-03 (×7): qty 1

## 2021-01-03 MED ORDER — CHLORPROMAZINE HCL 25 MG PO TABS
50.0000 mg | ORAL_TABLET | Freq: Two times a day (BID) | ORAL | Status: DC
  Administered 2021-01-03 – 2021-01-04 (×4): 50 mg via ORAL
  Filled 2021-01-03 (×5): qty 2
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
## 2021-01-04 NOTE — Progress Notes (Signed)
CSW received a call from [facility]. CSW was told by the nurse that they are going through their waiting list and was checking to see if patient is still at the hospital. CSW confirmed and the nurse stated they have no beds yet but wanted to make sure to keep him on the list. The nurse stated when a bed becomes available the doctor will determine if patient is appropriate. Number to call and follow-up on bed status is [phone]
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
## 2021-01-04 NOTE — Progress Notes (Signed)
Physical Therapy Discharge Patient Details Name: Sean Shepherd MRN: [id number] DOB: Jul 15, 1976 Today's Date: 01/04/2021 Time: 5501-5868 PT Time Calculation (min) (ACUTE ONLY): 13 min  Patient discharged from PT services secondary to goals met and no further PT needs identified.  Please see latest therapy progress note for current level of functioning and progress toward goals.    Progress and discharge plan discussed with patient and/or caregiver: Patient/Caregiver agrees with plan  GP     Shary Decamp [facility] 01/04/2021, 4:54 PM   Jennette Pager [phone] Office [phone]
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
## 2021-01-05 ENCOUNTER — Encounter (HOSPITAL_COMMUNITY): Payer: No Payment, Other | Admitting: Physician Assistant

## 2021-01-05 MED ORDER — CHLORPROMAZINE HCL 100 MG PO TABS
100.0000 mg | ORAL_TABLET | Freq: Two times a day (BID) | ORAL | Status: DC
  Administered 2021-01-05 – 2021-01-06 (×3): 100 mg via ORAL
  Filled 2021-01-05 (×5): qty 1
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
## 2021-01-05 NOTE — TOC Progression Note (Signed)
Transition of Care [facility]) - Progression Note    Patient Details  Name: Sean Shepherd MRN: [id number] Date of Birth: August 12, 1976  Transition of Care [facility]) CM/SW Contact  Jimmy Picket, [place] Phone Number: 01/05/2021, 10:44 AM  Clinical Narrative:     [facility], there are no available beds today.  Expected Discharge Plan: Psychiatric Hospital    Expected Discharge Plan and Services Expected Discharge Plan: Psychiatric Hospital In-house Referral: Clinical Social Work Discharge Planning Services: CM Consult Post Acute Care Choice: NA Living arrangements for the past 2 months: Single Family Home                                       Social Determinants of Health (SDOH) Interventions    Readmission Risk Interventions No flowsheet data found.  Jimmy Picket, Theresia Majors, [place] Clinical Social Worker [phone]

## 2021-01-06 MED ORDER — CHLORPROMAZINE HCL 100 MG PO TABS
200.0000 mg | ORAL_TABLET | Freq: Every day | ORAL | Status: DC
  Administered 2021-01-06 – 2021-01-08 (×3): 200 mg via ORAL
  Filled 2021-01-06 (×4): qty 2

## 2021-01-06 MED ORDER — CHLORPROMAZINE HCL 100 MG PO TABS
100.0000 mg | ORAL_TABLET | Freq: Every day | ORAL | Status: DC
  Administered 2021-01-07 – 2021-01-09 (×3): 100 mg via ORAL
  Filled 2021-01-06 (×3): qty 1
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
## 2021-01-06 NOTE — Plan of Care (Signed)
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
## 2021-01-08 MED ORDER — TRAZODONE HCL 100 MG PO TABS: 200.0000 mg | ORAL_TABLET | Freq: Every day | ORAL | Status: DC

## 2021-01-08 NOTE — Progress Notes (Signed)
Pt is due to void after catheter removal. He is starting to feel the urge, just hasn't gone yet.

## 2021-01-08 NOTE — Progress Notes (Signed)
Dressing on right ear changed. Minimal yellowish drainage. Cleansed with NS wipe, applied vaseline gauze, 4x4 gauze and tape.  Dressing on abdomen changed. Cleansed with NS wipe, applied dry 4x4 gauze, 2 ABD pads, and tape.
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
## 2021-01-08 NOTE — TOC Progression Note (Signed)
Transition of Care [facility]) - Progression Note    Patient Details  Name: Sean Shepherd MRN: [id number] Date of Birth: 1976/06/23  Transition of [facility] CM/SW Contact  Jimmy Picket, [place] Phone Number: 01/08/2021, 4:42 PM  Clinical Narrative:     CSW called [facility], pt is still on the waitlist for admission.   Expected Discharge Plan: Psychiatric Hospital    Expected Discharge Plan and Services Expected Discharge Plan: Psychiatric Hospital In-house Referral: Clinical Social Work Discharge Planning Services: CM Consult Post Acute Care Choice: NA Living arrangements for the past 2 months: Single Family Home                                       Social Determinants of Health (SDOH) Interventions    Readmission Risk Interventions No flowsheet data found.  Jimmy Picket, Theresia Majors, [place] Clinical Social Worker [phone]

## 2021-01-08 NOTE — Progress Notes (Addendum)
Pt urinated in urinal. 350 mL. Bladder scan shows 0 mL.  Pt had a small amount of blood come out of urine opening after urinating.

## 2021-01-08 NOTE — Progress Notes (Signed)
Mother called for an update.  Excited that pt has had catheter removed and voided.  Mother will be in tomorrow to visit pt.

## 2021-01-08 NOTE — Progress Notes (Signed)
Foley removed per orders.
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
## 2021-01-09 ENCOUNTER — Inpatient Hospital Stay (HOSPITAL_COMMUNITY)
Admission: AD | Admit: 2021-01-09 | Discharge: 2021-02-04 | DRG: 885 | Disposition: A | Discharge status: Home / Self Care | Payer: Federal, State, Local not specified - Other | Source: Intra-hospital | Attending: Emergency Medicine | Admitting: Emergency Medicine

## 2021-01-09 ENCOUNTER — Other Ambulatory Visit: Payer: Self-pay

## 2021-01-09 ENCOUNTER — Encounter (HOSPITAL_COMMUNITY): Payer: Self-pay | Admitting: Emergency Medicine

## 2021-01-09 DIAGNOSIS — R45851 Suicidal ideations: Secondary | ICD-10-CM | POA: Diagnosis present

## 2021-01-09 DIAGNOSIS — Z79899 Other long term (current) drug therapy: Secondary | ICD-10-CM

## 2021-01-09 DIAGNOSIS — F250 Schizoaffective disorder, bipolar type: Secondary | ICD-10-CM | POA: Diagnosis not present

## 2021-01-09 DIAGNOSIS — K5900 Constipation, unspecified: Secondary | ICD-10-CM | POA: Diagnosis present

## 2021-01-09 DIAGNOSIS — F411 Generalized anxiety disorder: Secondary | ICD-10-CM | POA: Diagnosis present

## 2021-01-09 DIAGNOSIS — H9312 Tinnitus, left ear: Secondary | ICD-10-CM | POA: Diagnosis present

## 2021-01-09 DIAGNOSIS — G4700 Insomnia, unspecified: Secondary | ICD-10-CM | POA: Diagnosis present

## 2021-01-09 DIAGNOSIS — F419 Anxiety disorder, unspecified: Secondary | ICD-10-CM | POA: Diagnosis present

## 2021-01-09 DIAGNOSIS — D72829 Elevated white blood cell count, unspecified: Secondary | ICD-10-CM

## 2021-01-09 DIAGNOSIS — Z87891 Personal history of nicotine dependence: Secondary | ICD-10-CM | POA: Diagnosis not present

## 2021-01-09 LAB — CBC
HCT: 29.5 % — ABNORMAL LOW (ref 39.0–52.0)
Hemoglobin: 9.5 g/dL — ABNORMAL LOW (ref 13.0–17.0)
MCH: 29.5 pg (ref 26.0–34.0)
MCHC: 32.2 g/dL (ref 30.0–36.0)
MCV: 91.6 fL (ref 80.0–100.0)
Platelets: 445 10*3/uL — ABNORMAL HIGH (ref 150–400)
RBC: 3.22 MIL/uL — ABNORMAL LOW (ref 4.22–5.81)
RDW: 13.2 % (ref 11.5–15.5)
WBC: 4.9 10*3/uL (ref 4.0–10.5)
nRBC: 0 % (ref 0.0–0.2)

## 2021-01-09 LAB — COMPREHENSIVE METABOLIC PANEL
ALT: 24 U/L (ref 0–44)
AST: 14 U/L — ABNORMAL LOW (ref 15–41)
Albumin: 2.8 g/dL — ABNORMAL LOW (ref 3.5–5.0)
Alkaline Phosphatase: 52 U/L (ref 38–126)
Anion gap: 5 (ref 5–15)
BUN: 13 mg/dL (ref 6–20)
CO2: 29 mmol/L (ref 22–32)
Calcium: 8.7 mg/dL — ABNORMAL LOW (ref 8.9–10.3)
Chloride: 102 mmol/L (ref 98–111)
Creatinine, Ser: 0.83 mg/dL (ref 0.61–1.24)
GFR, Estimated: >60 mL/min (ref >60)
Glucose, Bld: 106 mg/dL — ABNORMAL HIGH (ref 70–99)
Potassium: 4.1 mmol/L (ref 3.5–5.1)
Sodium: 136 mmol/L (ref 135–145)
Total Bilirubin: 0.4 mg/dL (ref 0.3–1.2)
Total Protein: 5.3 g/dL — ABNORMAL LOW (ref 6.5–8.1)

## 2021-01-09 LAB — HEMOGLOBIN A1C
Hgb A1c MFr Bld: 5.2 % (ref 4.8–5.6)
Mean Plasma Glucose: 102.54 mg/dL

## 2021-01-09 LAB — SARS CORONAVIRUS 2 (TAT 6-24 HRS): SARS Coronavirus 2: NEGATIVE

## 2021-01-09 MED ORDER — DOCUSATE SODIUM 100 MG PO CAPS: 100.0000 mg | ORAL_CAPSULE | Freq: Two times a day (BID) | ORAL | 0 refills | Status: DC

## 2021-01-09 MED ORDER — OXYCODONE HCL 10 MG PO TABS: 5.0000 mg | ORAL_TABLET | ORAL | 0 refills | Status: DC | PRN

## 2021-01-09 MED ORDER — ENSURE ENLIVE PO LIQD
237.0000 mL | Freq: Three times a day (TID) | ORAL | Status: DC
  Administered 2021-01-09 – 2021-02-04 (×56): 237 mL via ORAL
  Filled 2021-01-09 (×83): qty 237

## 2021-01-09 MED ORDER — CHLORPROMAZINE HCL 100 MG PO TABS
100.0000 mg | ORAL_TABLET | Freq: Every day | ORAL | Status: DC
  Administered 2021-01-10 – 2021-01-11 (×2): 100 mg via ORAL
  Filled 2021-01-09: qty 1
  Filled 2021-01-09: qty 4
  Filled 2021-01-09 (×3): qty 1

## 2021-01-09 MED ORDER — DOCUSATE SODIUM 100 MG PO CAPS
100.0000 mg | ORAL_CAPSULE | Freq: Two times a day (BID) | ORAL | Status: DC
  Administered 2021-01-11 – 2021-01-15 (×10): 100 mg via ORAL
  Filled 2021-01-09 (×18): qty 1

## 2021-01-09 MED ORDER — POLYETHYLENE GLYCOL 3350 17 G PO PACK
17.0000 g | PACK | Freq: Every day | ORAL | Status: DC
  Administered 2021-01-11 – 2021-02-04 (×19): 17 g via ORAL
  Filled 2021-01-09 (×2): qty 1
  Filled 2021-01-09: qty 5
  Filled 2021-01-09 (×29): qty 1

## 2021-01-09 MED ORDER — ACETAMINOPHEN 500 MG PO TABS
1000.0000 mg | ORAL_TABLET | Freq: Four times a day (QID) | ORAL | Status: DC
  Filled 2021-01-09 (×12): qty 2

## 2021-01-09 MED ORDER — ADULT MULTIVITAMIN W/MINERALS CH
1.0000 | ORAL_TABLET | Freq: Every day | ORAL | Status: DC
  Administered 2021-01-10 – 2021-02-04 (×26): 1 via ORAL
  Filled 2021-01-09 (×7): qty 1
  Filled 2021-01-09: qty 7
  Filled 2021-01-09 (×21): qty 1

## 2021-01-09 MED ORDER — MAGNESIUM HYDROXIDE 400 MG/5ML PO SUSP: 30.0000 mL | Freq: Every day | ORAL | Status: DC | PRN

## 2021-01-09 MED ORDER — PALIPERIDONE ER 9 MG PO TB24: 9.0000 mg | ORAL_TABLET | Freq: Every day | ORAL | Status: DC

## 2021-01-09 MED ORDER — PALIPERIDONE ER 6 MG PO TB24
9.0000 mg | ORAL_TABLET | Freq: Every day | ORAL | Status: DC
  Administered 2021-01-09 – 2021-01-11 (×3): 9 mg via ORAL
  Filled 2021-01-09 (×5): qty 1

## 2021-01-09 MED ORDER — LORAZEPAM 1 MG PO TABS
1.0000 mg | ORAL_TABLET | ORAL | Status: AC | PRN
  Administered 2021-01-13: 1 mg via ORAL
  Filled 2021-01-09: qty 1

## 2021-01-09 MED ORDER — OLANZAPINE 5 MG PO TBDP
5.0000 mg | ORAL_TABLET | Freq: Three times a day (TID) | ORAL | Status: DC | PRN
  Administered 2021-01-12: 5 mg via ORAL
  Filled 2021-01-09: qty 1

## 2021-01-09 MED ORDER — METHOCARBAMOL 500 MG PO TABS
1000.0000 mg | ORAL_TABLET | Freq: Three times a day (TID) | ORAL | Status: DC | PRN
Start: 2021-01-09 — End: 2021-02-04

## 2021-01-09 MED ORDER — CHLORPROMAZINE HCL 200 MG PO TABS: 200.0000 mg | ORAL_TABLET | Freq: Every day | ORAL | Status: DC

## 2021-01-09 MED ORDER — CHLORPROMAZINE HCL 100 MG PO TABS
200.0000 mg | ORAL_TABLET | Freq: Every day | ORAL | Status: DC
  Administered 2021-01-09 – 2021-01-11 (×3): 200 mg via ORAL
  Filled 2021-01-09: qty 2
  Filled 2021-01-09: qty 4
  Filled 2021-01-09 (×3): qty 2

## 2021-01-09 MED ORDER — ZIPRASIDONE MESYLATE 20 MG IM SOLR
20.0000 mg | INTRAMUSCULAR | Status: AC | PRN
  Administered 2021-01-13: 20 mg via INTRAMUSCULAR
  Filled 2021-01-09: qty 20

## 2021-01-09 MED ORDER — SODIUM CHLORIDE 0.9 % IN NEBU
INHALATION_SOLUTION | RESPIRATORY_TRACT | Status: AC
  Filled 2021-01-09: qty 6

## 2021-01-09 MED ORDER — MIRTAZAPINE 30 MG PO TABS
30.0000 mg | ORAL_TABLET | Freq: Every day | ORAL | Status: DC
  Administered 2021-01-09 – 2021-01-11 (×3): 30 mg via ORAL
  Filled 2021-01-09 (×5): qty 1

## 2021-01-09 MED ORDER — BENZTROPINE MESYLATE 1 MG PO TABS: 1.0000 mg | ORAL_TABLET | Freq: Every day | ORAL | Status: DC

## 2021-01-09 MED ORDER — GABAPENTIN 300 MG PO CAPS
300.0000 mg | ORAL_CAPSULE | Freq: Three times a day (TID) | ORAL | Status: DC
  Administered 2021-01-10 – 2021-01-21 (×35): 300 mg via ORAL
  Filled 2021-01-09 (×42): qty 1

## 2021-01-09 MED ORDER — BENZTROPINE MESYLATE 1 MG PO TABS
1.0000 mg | ORAL_TABLET | Freq: Every day | ORAL | Status: DC
  Administered 2021-01-09 – 2021-01-11 (×3): 1 mg via ORAL
  Filled 2021-01-09 (×5): qty 1

## 2021-01-09 MED ORDER — GABAPENTIN 300 MG PO CAPS: 300.0000 mg | ORAL_CAPSULE | Freq: Three times a day (TID) | ORAL | Status: DC

## 2021-01-09 MED ORDER — MIRTAZAPINE 30 MG PO TABS: 30.0000 mg | ORAL_TABLET | Freq: Every day | ORAL | Status: DC

## 2021-01-09 MED ORDER — ALUM & MAG HYDROXIDE-SIMETH 200-200-20 MG/5ML PO SUSP: 30.0000 mL | ORAL | Status: DC | PRN

## 2021-01-09 MED ORDER — ACETAMINOPHEN 325 MG PO TABS
650.0000 mg | ORAL_TABLET | Freq: Four times a day (QID) | ORAL | Status: DC | PRN
Start: 2021-01-09 — End: 2021-02-04

## 2021-01-09 MED ORDER — ACETAMINOPHEN 500 MG PO TABS: 1000.0000 mg | ORAL_TABLET | Freq: Four times a day (QID) | ORAL | 0 refills | Status: DC | PRN

## 2021-01-09 MED ORDER — CHLORPROMAZINE HCL 100 MG PO TABS: 100.0000 mg | ORAL_TABLET | Freq: Every day | ORAL | Status: DC

## 2021-01-09 NOTE — Progress Notes (Signed)
Attempted to call report x 1  
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
## 2021-01-09 NOTE — Progress Notes (Addendum)
   01/09/21 2030  Psych Admission Type (Psych Patients Only)  Admission Status Voluntary  Psychosocial Assessment  Patient Complaints Anxiety;Depression;Insomnia  Eye Contact Fair  Facial Expression Flat  Affect Sad  Speech Logical/coherent  Interaction Assertive  Motor Activity Slow  Appearance/Hygiene In scrubs  Behavior Characteristics Cooperative;Anxious  Mood Depressed;Anxious;Sad;Pleasant  Thought Process  Coherency WDL  Content WDL  Delusions None reported or observed  Perception Hallucinations  Hallucination Auditory  Judgment Poor  Confusion None  Danger to Self  Current suicidal ideation? Denies  Danger to Others  Danger to Others None reported or observed  Danger to Others Abnormal  Harmful Behavior to others No threats or harm toward other people   Pt seen in his room. Pt denies SI, HI. Pt endorses AH saying that he still hears the voices but they are not command right now. Pt not in much pain given the extent of his traumatic injuries. Pt rates pain 2/10 and refused scheduled pain medication. Pt rates anxiety and depression 10/10. Endorses trouble sleeping d/t racing thoughts about his self-inflicted wounds as well as what will be the lasting results of his actions. Pt says that his anxiety and depression have been building for months and the voices got worse during that time.  Pt has no physical issues with urination but was given a urinal because he is having trouble directing the stream. Pt laceration to right ear was assessed and is clean, dry and intact. No Vaseline-impregnated gauze available so Vaseline and Telfa used to cover R ear while he sleeps. Pt abdominal wound is dry. Sterile saline used to clean area. Scrotal wounds and amputation site examined. Both are clean and dry.

## 2021-01-09 NOTE — Tx Team (Signed)
Initial Treatment Plan 01/09/2021 6:56 PM [name] IHW:[id number]    PATIENT STRESSORS: Health problems Legal issue Traumatic event   PATIENT STRENGTHS: Ability for insight Capable of independent living Communication skills Motivation for treatment/growth Supportive family/friends   PATIENT IDENTIFIED PROBLEMS: "Learn to live my life after this extreme experience"  "To find happiness"  Depression  Suicidal ideation  Hallucination             DISCHARGE CRITERIA:  Ability to meet basic life and health needs Adequate post-discharge living arrangements Motivation to continue treatment in a less acute level of care  PRELIMINARY DISCHARGE PLAN: Attend aftercare/continuing care group Outpatient therapy Return to previous living arrangement  PATIENT/FAMILY INVOLVEMENT: This treatment plan has been presented to and reviewed with the patient, Sean Shepherd.  The patient and family have been given the opportunity to ask questions and make suggestions.  Clarene Critchley, RN 01/09/2021, 6:56 PM

## 2021-01-09 NOTE — Progress Notes (Incomplete)
Admission Note: Patient is a 45 years old male admitted to the unit from [facility] for symptoms of depression, suicidal ideation and audiovisual hallucinations.  Reports voices telling him to do bad things to himself and sees things turning to something else in front of him.  Patient is alert and oriented x 4.  Presents with a flat affect and depressed mood.  States goal is to learn how to live life after his extreme experience.  Patient is very remorseful and regretful after what he did to himself.  Skin assessment completed.  Abdominal incision from under nipple area down to groin.  Penis removed, testicle removed and right ear lobe removed.  Incision line on left side of neck and some incision line or right side of neck noted.  Closed laceration line on both groin areas.  Abrasions noted on bilateral knees and top of both feet.  Patient oriented to the unit, staff and room.  Routine safety checks initiated.  Verbalizes understanding of unit rules/protocols.  Patient is safe on the unit at this time.
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
## 2021-01-09 NOTE — Discharge Instructions (Signed)
[facility]      [facility] [phone]  OPEN ABDOMINAL SURGERY: POST OP INSTRUCTIONS  Always review your discharge instruction sheet given to you by the facility where your surgery was performed.  IF YOU HAVE DISABILITY OR FAMILY LEAVE FORMS, YOU MUST BRING THEM TO THE OFFICE FOR PROCESSING.  PLEASE DO NOT GIVE THEM TO YOUR DOCTOR.  1. A prescription for pain medication may be given to you upon discharge.  Take your pain medication as prescribed, if needed.  If narcotic pain medicine is not needed, then you may take acetaminophen (Tylenol) or ibuprofen (Advil) as needed. 2. Take your usually prescribed medications unless otherwise directed. 3. If you need a refill on your pain medication, please contact your pharmacy. They will contact our office to request authorization.  Prescriptions will not be filled after 5pm or on week-ends. 4. You should follow a light diet the first few days after arrival home, such as soup and crackers, pudding, etc.unless your doctor has advised otherwise. A high-fiber, low fat diet can be resumed as tolerated.   Be sure to include lots of fluids daily. Most patients will experience some swelling and bruising on the chest and neck area.  Ice packs will help.  Swelling and bruising can take several days to resolve 5. Most patients will experience some swelling and bruising in the area of the incision. Ice pack will help. Swelling and bruising can take several days to resolve..  6. It is common to experience some constipation if taking pain medication after surgery.  Increasing fluid intake and taking a stool softener will usually help or prevent this problem from occurring.  A mild laxative (Milk of Magnesia or Miralax) should be taken according to package directions if there are no bowel movements after 48 hours. 7.  You may have steri-strips (small skin tapes) in place directly over the incision.  These strips should be left on the skin for 7-10 days.  If your  surgeon used skin glue on the incision, you may shower in 24 hours.  The glue will flake off over the next 2-3 weeks.  Any sutures or staples will be removed at the office during your follow-up visit. You may find that a light gauze bandage over your incision may keep your staples from being rubbed or pulled. You may shower and replace the bandage daily. 8. ACTIVITIES:  You may resume regular (light) daily activities beginning the next day--such as daily self-care, walking, climbing stairs--gradually increasing activities as tolerated.  You may have sexual intercourse when it is comfortable.  Refrain from any heavy lifting or straining until approved by your doctor. a. You may drive when you no longer are taking prescription pain medication, you can comfortably wear a seatbelt, and you can safely maneuver your car and apply brakes b. Return to Work: ___________________________________ 9. You should see your doctor in the office for a follow-up appointment approximately two weeks after your surgery.  Make sure that you call for this appointment within a day or two after you arrive home to insure a convenient appointment time. OTHER INSTRUCTIONS:  _____________________________________________________________ _____________________________________________________________  WHEN TO CALL YOUR DOCTOR: 1. Fever over 101.0 2. Inability to urinate 3. Nausea and/or vomiting 4. Extreme swelling or bruising 5. Continued bleeding from incision. 6. Increased pain, redness, or drainage from the incision. 7. Difficulty swallowing or breathing 8. Muscle cramping or spasms. 9. Numbness or tingling in hands or feet or around lips.  The clinic staff is available to   answer your questions during regular business hours.  Please don't hesitate to call and ask to speak to one of the nurses if you have concerns.  For further questions, please visit [email or web address]   

## 2021-01-09 NOTE — Progress Notes (Signed)
Patient to be discharged today at 1600 to [facility]. Covid swab completed and sent to lab.

## 2021-01-09 NOTE — TOC Transition Note (Signed)
Transition of Care [facility]) - CM/SW Discharge Note   Patient Details  Name: Sean Shepherd MRN: [id number] Date of Birth: [birth year]  [facility] CM/SW Contact:  Jimmy Picket, [place] Phone Number: 01/09/2021, 12:15 PM   Clinical Narrative:     Patient will DC to: [facility] Anticipated DC date: 01/09/21 Family notified:  Transport by: [facility]     Per MD patient ready for DC to [facility]. RN, patient, patient's family, and facility notified of DC. Discharge Summary sent to facility. Voluntary admission form has been faxed to [facility]. Voluntary rider form placed on chart. DC packet on chart. Covid test is pending, transportation has not yet been called.   RN to call report to [phone].  CSW will sign off for now as social work intervention is no longer needed. Please consult [place] again if new needs arise.  Final next level of care: Psychiatric Hospital Barriers to Discharge: Barriers Resolved   Patient Goals and CMS Choice     Choice offered to / list presented to : NA  Discharge Placement              Patient chooses bed at:  [facility]) Patient to be transferred to [facility] by[facility]   Patient and family notified of of transfer: 01/09/21  Discharge Plan and Services In-house Referral: Clinical Social Work Discharge Planning Services: CM Consult Post Acute Care Choice: NA                               Social Determinants of Health (SDOH) Interventions     Readmission Risk Interventions No flowsheet data found.   Jimmy Picket, Theresia Majors, [place] Clinical Social Worker [phone]

## 2021-01-10 ENCOUNTER — Encounter (HOSPITAL_COMMUNITY): Payer: Self-pay | Admitting: Student in an Organized Health Care Education/Training Program

## 2021-01-10 DIAGNOSIS — F419 Anxiety disorder, unspecified: Secondary | ICD-10-CM

## 2021-01-10 HISTORY — DX: Anxiety disorder, unspecified: F41.9

## 2021-01-10 LAB — DIFFERENTIAL
Abs Immature Granulocytes: 0.01 10*3/uL (ref 0.00–0.07)
Basophils Absolute: 0 10*3/uL (ref 0.0–0.1)
Basophils Relative: 1 %
Eosinophils Absolute: 0.2 10*3/uL (ref 0.0–0.5)
Eosinophils Relative: 4 %
Immature Granulocytes: 0 %
Lymphocytes Relative: 26 %
Lymphs Abs: 1.2 10*3/uL (ref 0.7–4.0)
Monocytes Absolute: 0.5 10*3/uL (ref 0.1–1.0)
Monocytes Relative: 10 %
Neutro Abs: 2.9 10*3/uL (ref 1.7–7.7)
Neutrophils Relative %: 59 %

## 2021-01-10 LAB — CBC
HCT: 31.6 % — ABNORMAL LOW (ref 39.0–52.0)
Hemoglobin: 10 g/dL — ABNORMAL LOW (ref 13.0–17.0)
MCH: 29.4 pg (ref 26.0–34.0)
MCHC: 31.6 g/dL (ref 30.0–36.0)
MCV: 92.9 fL (ref 80.0–100.0)
Platelets: 463 10*3/uL — ABNORMAL HIGH (ref 150–400)
RBC: 3.4 MIL/uL — ABNORMAL LOW (ref 4.22–5.81)
RDW: 13.5 % (ref 11.5–15.5)
WBC: 4.8 10*3/uL (ref 4.0–10.5)
nRBC: 0 % (ref 0.0–0.2)

## 2021-01-10 LAB — COMPREHENSIVE METABOLIC PANEL
ALT: 23 U/L (ref 0–44)
AST: 15 U/L (ref 15–41)
Albumin: 3.3 g/dL — ABNORMAL LOW (ref 3.5–5.0)
Alkaline Phosphatase: 47 U/L (ref 38–126)
Anion gap: 7 (ref 5–15)
BUN: 16 mg/dL (ref 6–20)
CO2: 27 mmol/L (ref 22–32)
Calcium: 8.7 mg/dL — ABNORMAL LOW (ref 8.9–10.3)
Chloride: 104 mmol/L (ref 98–111)
Creatinine, Ser: 0.76 mg/dL (ref 0.61–1.24)
GFR, Estimated: >60 mL/min (ref >60)
Glucose, Bld: 89 mg/dL (ref 70–99)
Potassium: 3.9 mmol/L (ref 3.5–5.1)
Sodium: 138 mmol/L (ref 135–145)
Total Bilirubin: 0.4 mg/dL (ref 0.3–1.2)
Total Protein: 5.9 g/dL — ABNORMAL LOW (ref 6.5–8.1)

## 2021-01-10 NOTE — [facility] Group Notes (Signed)
LCSW Group Therapy Note  Type of Therapy/Topic: Group Therapy: Six Dimensions of Wellness  Participation Level: Active  Description of Group:  This group will address the concept of wellness and the six concepts of wellness: occupational, physical, social, intellectual, spiritual, and emotional. Patients will be encouraged to process areas in their lives that are out of balance and identify reasons for remaining unbalanced. Patients will be encouraged to explore ways to practice healthy habits daily to attain better physical and mental health outcomes.  Therapeutic Goals:  1. Identify aspects of wellness that they are doing well.  2. Identify aspects of wellness that they would like to improve upon.  3. Identify one action they can take to improve an aspect of wellness in their lives.  Summary of Patient Progress: Coltin attended group and discussed various ways a person can achieve wellness with his peers.  Ashely also participated in an outside activity with his peers within reasonable limits given his physical concerns.
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
## 2021-01-10 NOTE — Progress Notes (Signed)
Recreation Therapy Notes  Date: 5.11.22 Time: 0930 Location: 300 Hall Dayroom  Group Topic: Stress Management   Goal Area(s) Addresses:  Patient will actively participate in stress management techniques presented during session.  Patient will successfully identify benefit of practicing stress management post d/c.   Behavioral Response: Appropriate  Intervention: Guided exercise with ambient sound and script  Activity :Guided Imagery  LRT read a script that focused enjoying the sights and sounds of being in a wildlife sanctuary.  Patients were to listen, focus on their breathing and relax to follow along as script was being read.  Education:  Stress Management, Discharge Planning.   Education Outcome: Acknowledges education  Clinical Observations/Feedback: Patient actively engaged in technique introduced, expressed no concerns.     Adonis Yim, LRT/CTRS        Lyndie Vanderloop A 01/10/2021 10:52 AM 

## 2021-01-10 NOTE — Progress Notes (Signed)
Pt observed lying in bed resting at the shift change, pt denied pain, SI/HI, AVH. Pt wounds assessed and noted to be  cleaned, dry and without drainage/discharge. Pt requested for the midline abd wound to be left open to air. Pt given his evening medications as scheduled and tolerated well. Support and encouragement offered as needed, will continue to monitor.

## 2021-01-10 NOTE — [facility] Group Notes (Signed)
Adult Psychoeducational Group Note  Date:  01/10/2021 Time:  11:40 AM  Group Topic/Focus:  Overcoming Stress:   The focus of this group is to define stress and help patients assess their triggers. Personal Choices and Values:   The focus of this group is to help patients assess and explore the importance of values in their lives, how their values affect their decisions, how they express their values and what opposes their expression.  Participation Level:  Active  Participation Quality:  Appropriate and Attentive  Affect:  Appropriate  Cognitive:  Alert and Appropriate  Insight: Appropriate and Good  Engagement in Group:  Engaged  Modes of Intervention:  Discussion  Additional Comments:  Pt attended morning MHT groups.  Sean Shepherd 01/10/2021, 11:40 AM

## 2021-01-10 NOTE — BHH Counselor (Signed)
Pt did not attend group. 

## 2021-01-10 NOTE — Tx Team (Signed)
Interdisciplinary Treatment and Diagnostic Plan Update  01/10/2021 Time of Session: 9:50am Sean Shepherd MRN: [id number]  Principal Diagnosis: Schizoaffective disorder, bipolar type ([place])  Secondary Diagnoses: Principal Problem:   Schizoaffective disorder, bipolar type ([place]) Active Problems:   Insomnia   Anxiety disorder, unspecified   Current Medications:  Current Facility-Administered Medications  Medication Dose Route Frequency Provider Last Rate Last Admin  . acetaminophen (TYLENOL) tablet 650 mg  650 mg Oral Q6H PRN Briant Cedar, MD      . alum & mag hydroxide-simeth (MAALOX/MYLANTA) 200-200-20 MG/5ML suspension 30 mL  30 mL Oral Q4H PRN Briant Cedar, MD      . benztropine (COGENTIN) tablet 1 mg  1 mg Oral QHS Briant Cedar, MD   1 mg at 01/09/21 2141  . chlorproMAZINE (THORAZINE) tablet 100 mg  100 mg Oral Daily Briant Cedar, MD   100 mg at 01/10/21 0830   And  . chlorproMAZINE (THORAZINE) tablet 200 mg  200 mg Oral QHS Briant Cedar, MD   200 mg at 01/09/21 2140  . docusate sodium (COLACE) capsule 100 mg  100 mg Oral BID Briant Cedar, MD      . feeding supplement (ENSURE ENLIVE / ENSURE PLUS) liquid 237 mL  237 mL Oral TID BM Briant Cedar, MD   237 mL at 01/10/21 0939  . gabapentin (NEURONTIN) capsule 300 mg  300 mg Oral TID Briant Cedar, MD   300 mg at 01/10/21 1300  . OLANZapine zydis (ZYPREXA) disintegrating tablet 5 mg  5 mg Oral Q8H PRN Briant Cedar, MD       And  . LORazepam (ATIVAN) tablet 1 mg  1 mg Oral PRN Briant Cedar, MD       And  . ziprasidone (GEODON) injection 20 mg  20 mg Intramuscular PRN Briant Cedar, MD      . magnesium hydroxide (MILK OF MAGNESIA) suspension 30 mL  30 mL Oral Daily PRN Briant Cedar, MD      . mirtazapine (REMERON) tablet 30 mg  30 mg Oral QHS Briant Cedar, MD   30 mg at 01/09/21 2141  . multivitamin with minerals tablet 1  tablet  1 tablet Oral Daily Briant Cedar, MD   1 tablet at 01/10/21 0830  . paliperidone (INVEGA) 24 hr tablet 9 mg  9 mg Oral QHS Briant Cedar, MD   9 mg at 01/09/21 2141  . polyethylene glycol (MIRALAX / GLYCOLAX) packet 17 g  17 g Oral Daily Pashayan, Redgie Grayer, MD       PTA Medications: Medications Prior to Admission  Medication Sig Dispense Refill Last Dose  . acetaminophen (TYLENOL) 500 MG tablet Take 2 tablets (1,000 mg total) by mouth every 6 (six) hours as needed. 30 tablet 0   . benztropine (COGENTIN) 1 MG tablet Take 1 tablet (1 mg total) by mouth at bedtime.     . chlorproMAZINE (THORAZINE) 100 MG tablet Take 1 tablet (100 mg total) by mouth daily.     . chlorproMAZINE (THORAZINE) 200 MG tablet Take 1 tablet (200 mg total) by mouth at bedtime.     . docusate sodium (COLACE) 100 MG capsule Take 1 capsule (100 mg total) by mouth 2 (two) times daily. 10 capsule 0   . gabapentin (NEURONTIN) 300 MG capsule Take 1 capsule (300 mg total) by mouth 3 (three) times daily.     . methocarbamol (ROBAXIN) 500 MG tablet Take 2 tablets (  1,000 mg total) by mouth every 8 (eight) hours as needed for muscle spasms.     . mirtazapine (REMERON) 30 MG tablet Take 1 tablet (30 mg total) by mouth at bedtime.     . Multiple Vitamin (MULTIVITAMIN WITH MINERALS) TABS tablet Take 1 tablet by mouth daily.     . Oxycodone HCl 10 MG TABS Take 0.5-1 tablets (5-10 mg total) by mouth every 4 (four) hours as needed. 15 tablet 0   . paliperidone (INVEGA) 9 MG 24 hr tablet Take 1 tablet (9 mg total) by mouth at bedtime.       Patient Stressors: Health problems Legal issue Traumatic event  Patient Strengths: Ability for insight Capable of independent living Communication skills Motivation for treatment/growth Supportive family/friends  Treatment Modalities: Medication Management, Group therapy, Case management,  1 to 1 session with clinician, Psychoeducation, Recreational  therapy.   Physician Treatment Plan for Primary Diagnosis: Schizoaffective disorder, bipolar type ([place]) Long Term Goal(s): Improvement in symptoms so as ready for discharge Improvement in symptoms so as ready for discharge   Short Term Goals: Ability to identify changes in lifestyle to reduce recurrence of condition will improve Ability to verbalize feelings will improve Ability to disclose and discuss suicidal ideas Ability to demonstrate self-control will improve Ability to identify and develop effective coping behaviors will improve Compliance with prescribed medications will improve Ability to identify triggers associated with substance abuse/mental health issues will improve Ability to identify changes in lifestyle to reduce recurrence of condition will improve Ability to verbalize feelings will improve Ability to disclose and discuss suicidal ideas Ability to demonstrate self-control will improve Ability to identify and develop effective coping behaviors will improve Compliance with prescribed medications will improve Ability to identify triggers associated with substance abuse/mental health issues will improve  Medication Management: Evaluate patient's response, side effects, and tolerance of medication regimen.  Therapeutic Interventions: 1 to 1 sessions, Unit Group sessions and Medication administration.  Evaluation of Outcomes: Not Met  Physician Treatment Plan for Secondary Diagnosis: Principal Problem:   Schizoaffective disorder, bipolar type ([place]) Active Problems:   Insomnia   Anxiety disorder, unspecified  Long Term Goal(s): Improvement in symptoms so as ready for discharge Improvement in symptoms so as ready for discharge   Short Term Goals: Ability to identify changes in lifestyle to reduce recurrence of condition will improve Ability to verbalize feelings will improve Ability to disclose and discuss suicidal ideas Ability to demonstrate self-control will  improve Ability to identify and develop effective coping behaviors will improve Compliance with prescribed medications will improve Ability to identify triggers associated with substance abuse/mental health issues will improve Ability to identify changes in lifestyle to reduce recurrence of condition will improve Ability to verbalize feelings will improve Ability to disclose and discuss suicidal ideas Ability to demonstrate self-control will improve Ability to identify and develop effective coping behaviors will improve Compliance with prescribed medications will improve Ability to identify triggers associated with substance abuse/mental health issues will improve     Medication Management: Evaluate patient's response, side effects, and tolerance of medication regimen.  Therapeutic Interventions: 1 to 1 sessions, Unit Group sessions and Medication administration.  Evaluation of Outcomes: Not Met   RN Treatment Plan for Primary Diagnosis: Schizoaffective disorder, bipolar type ([place]) Long Term Goal(s): Knowledge of disease and therapeutic regimen to maintain health will improve  Short Term Goals: Ability to remain free from injury will improve, Ability to verbalize frustration and anger appropriately will improve, Ability to demonstrate self-control, Ability to  identify and develop effective coping behaviors will improve and Compliance with prescribed medications will improve  Medication Management: RN will administer medications as ordered by provider, will assess and evaluate patient's response and provide education to patient for prescribed medication. RN will report any adverse and/or side effects to prescribing provider.  Therapeutic Interventions: 1 on 1 counseling sessions, Psychoeducation, Medication administration, Evaluate responses to treatment, Monitor vital signs and CBGs as ordered, Perform/monitor CIWA, COWS, AIMS and Fall Risk screenings as ordered, Perform wound care  treatments as ordered.  Evaluation of Outcomes: Not Met   LCSW Treatment Plan for Primary Diagnosis: Schizoaffective disorder, bipolar type ([place]) Long Term Goal(s): Safe transition to appropriate next level of care at discharge, Engage patient in therapeutic group addressing interpersonal concerns.  Short Term Goals: Engage patient in aftercare planning with referrals and resources, Increase social support, Increase ability to appropriately verbalize feelings, Identify triggers associated with mental health/substance abuse issues and Increase skills for wellness and recovery  Therapeutic Interventions: Assess for all discharge needs, 1 to 1 time with Social worker, Explore available resources and support systems, Assess for adequacy in community support network, Educate family and significant other(s) on suicide prevention, Complete Psychosocial Assessment, Interpersonal group therapy.  Evaluation of Outcomes: Not Met   Progress in Treatment: Attending groups: Yes. Participating in groups: Yes. Taking medication as prescribed: Yes. Toleration medication: Yes. Family/Significant other contact made: No, will contact:  mother Patient understands diagnosis: Yes. Discussing patient identified problems/goals with staff: Yes. Medical problems stabilized or resolved: Yes. Denies suicidal/homicidal ideation: Yes. Issues/concerns per patient self-inventory: No.    New problem(s) identified: No, Describe:  none  New Short Term/Long Term Goal(s): medication stabilization, elimination of SI thoughts, development of comprehensive mental wellness plan.   Patient Goals:  Did not attend  Discharge Plan or Barriers: Patient recently admitted. CSW will continue to follow and assess for appropriate referrals and possible discharge planning.     Reason for Continuation of Hospitalization: Delusions  Depression Medical Issues Medication stabilization  Estimated Length of Stay: 3-5  days  Attendees: Patient: Did not attend 01/10/2021 1:59 PM  Physician:  01/10/2021 1:59 PM  Nursing:  01/10/2021 1:59 PM  RN Care Manager: 01/10/2021 1:59 PM  Social Worker: Darletta Moll, [place] 01/10/2021 1:59 PM  Recreational Therapist:  01/10/2021 1:59 PM  Other:  01/10/2021 1:59 PM  Other:  01/10/2021 1:59 PM  Other: 01/10/2021 1:59 PM    Scribe for Treatment Team: Vassie Moselle, LCSW 01/10/2021 1:59 PM
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
## 2021-01-10 NOTE — Progress Notes (Signed)
Recreation Therapy Notes  Date: 5.11.22 Time: 0930 Location: [address] Dayroom  Group Topic: Stress Management   Goal Area(s) Addresses:  Patient will actively participate in stress management techniques presented during session.  Patient will successfully identify benefit of practicing stress management post d/c.   Behavioral Response: Appropriate  Intervention: Guided exercise with ambient sound and script  Activity :Guided Imagery  LRT read a script that focused enjoying the sights and sounds of being in a wildlife sanctuary.  Patients were to listen, focus on their breathing and relax to follow along as script was being read.  Education:  Stress Management, Discharge Planning.   Education Outcome: Acknowledges education  Clinical Observations/Feedback: Patient actively engaged in technique introduced, expressed no concerns.     Caroll Rancher, LRT/CTRS        Caroll Rancher A 01/10/2021 10:51 AM

## 2021-01-10 NOTE — Progress Notes (Signed)
   01/10/21 0626  Vital Signs  Pulse Rate (!) 107  Pulse Rate Source Monitor  BP (!) 83/52  BP Location Right Arm  BP Method Automatic  Patient Position (if appropriate) Standing  Oxygen Therapy  SpO2 100 %   Standing BP low. Pt endorsed some lightheadedness. Given 12 oz of Gatorade to drink.
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
## 2021-01-11 LAB — CBC WITH DIFFERENTIAL/PLATELET
Abs Immature Granulocytes: 0.01 10*3/uL (ref 0.00–0.07)
Basophils Absolute: 0 10*3/uL (ref 0.0–0.1)
Basophils Relative: 1 %
Eosinophils Absolute: 0.2 10*3/uL (ref 0.0–0.5)
Eosinophils Relative: 4 %
HCT: 31.9 % — ABNORMAL LOW (ref 39.0–52.0)
Hemoglobin: 10.1 g/dL — ABNORMAL LOW (ref 13.0–17.0)
Immature Granulocytes: 0 %
Lymphocytes Relative: 24 %
Lymphs Abs: 1.1 10*3/uL (ref 0.7–4.0)
MCH: 29.4 pg (ref 26.0–34.0)
MCHC: 31.7 g/dL (ref 30.0–36.0)
MCV: 92.7 fL (ref 80.0–100.0)
Monocytes Absolute: 0.4 10*3/uL (ref 0.1–1.0)
Monocytes Relative: 9 %
Neutro Abs: 2.8 10*3/uL (ref 1.7–7.7)
Neutrophils Relative %: 62 %
Platelets: 424 10*3/uL — ABNORMAL HIGH (ref 150–400)
RBC: 3.44 MIL/uL — ABNORMAL LOW (ref 4.22–5.81)
RDW: 13.7 % (ref 11.5–15.5)
WBC: 4.5 10*3/uL (ref 4.0–10.5)
nRBC: 0 % (ref 0.0–0.2)

## 2021-01-11 LAB — BRAIN NATRIURETIC PEPTIDE: B Natriuretic Peptide: 33.5 pg/mL (ref 0.0–100.0)

## 2021-01-11 LAB — TROPONIN I (HIGH SENSITIVITY): Troponin I (High Sensitivity): 19 ng/L — ABNORMAL HIGH (ref <18)

## 2021-01-11 LAB — C-REACTIVE PROTEIN: CRP: 0.8 mg/dL (ref <1.0)
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
## 2021-01-11 NOTE — Progress Notes (Signed)
Pt presents guarded, isolative in room majority of this shift with flat /sullen affect, depressed mood and is cautious / minimal on interactions. Pt ambulatory in milieu with a steady but slow gait. Continues to endorse +SI with AH stated to writer on assessment "Yes the voices are still there. They are still telling me to do things but I'm ignoring them and trying not to listen to them". Currently denies HI, VH and pain when assessed. Pt tolerated fluids and meals well. Vitals WNL. Safety checks maintained without self harm gestures thus far. Pt verbally contracts for safety. All medications given as order with verbal education. Emotional support offered to pt. Fluids encouraged and given at intervals throughout this shift. Dressing changes done as ordered, wound edges appears approximated without s/s of infection at sites.  Pt remains medication compliant. Tolerates all PO intake well without discomfort. Off unit for meals and return without issues. Declined all scheduled groups despite multiple prompts.

## 2021-01-11 NOTE — B[facility] Group Notes (Signed)
Adult Psychoeducational Group Note  Date:  01/11/2021 Time:  2:01 PM  Group Topic/Focus:  Goals Group:   The focus of this group is to help patients establish daily goals to achieve during treatment and discuss how the patient can incorporate goal setting into their daily lives to aide in recovery. Managing Feelings:   The focus of this group is to identify what feelings patients have difficulty handling and develop a plan to handle them in a healthier way upon discharge.  Participation Level:  Active  Participation Quality:  Appropriate and Attentive  Affect:  Appropriate  Cognitive:  Alert and Appropriate  Insight: Appropriate and Good  Engagement in Group:  Engaged  Modes of Intervention:  Discussion  Additional Comments:  Pt attended goals/managing feelings groups this morning led by the MHT.  Deforest Hoyles Harneet Noblett 01/11/2021, 2:01 PM

## 2021-01-12 MED ORDER — CHLORPROMAZINE HCL 100 MG PO TABS
100.0000 mg | ORAL_TABLET | Freq: Every day | ORAL | Status: DC
  Administered 2021-01-12: 100 mg via ORAL
  Filled 2021-01-12 (×2): qty 1

## 2021-01-12 MED ORDER — PALIPERIDONE ER 6 MG PO TB24
6.0000 mg | ORAL_TABLET | Freq: Every day | ORAL | Status: DC
  Administered 2021-01-12: 6 mg via ORAL
  Filled 2021-01-12 (×2): qty 1

## 2021-01-12 MED ORDER — BENZTROPINE MESYLATE 0.5 MG PO TABS
0.5000 mg | ORAL_TABLET | Freq: Every day | ORAL | Status: DC
  Administered 2021-01-12 – 2021-01-14 (×3): 0.5 mg via ORAL
  Filled 2021-01-12 (×6): qty 1

## 2021-01-12 MED ORDER — MIRTAZAPINE 15 MG PO TABS
15.0000 mg | ORAL_TABLET | Freq: Every day | ORAL | Status: DC
  Administered 2021-01-12 – 2021-01-14 (×3): 15 mg via ORAL
  Filled 2021-01-12 (×6): qty 1

## 2021-01-12 MED ORDER — CLOZAPINE 25 MG PO TABS
12.5000 mg | ORAL_TABLET | Freq: Two times a day (BID) | ORAL | Status: DC
  Administered 2021-01-12 – 2021-01-13 (×3): 12.5 mg via ORAL
  Filled 2021-01-12 (×6): qty 1

## 2021-01-12 MED ORDER — WHITE PETROLATUM EX OINT
TOPICAL_OINTMENT | CUTANEOUS | Status: AC
  Filled 2021-01-12: qty 5

## 2021-01-12 MED ORDER — ZOLPIDEM TARTRATE 5 MG PO TABS
5.0000 mg | ORAL_TABLET | Freq: Every evening | ORAL | Status: DC | PRN
  Administered 2021-01-12: 5 mg via ORAL
  Filled 2021-01-12: qty 1

## 2021-01-12 MED ORDER — CHLORPROMAZINE HCL 50 MG PO TABS
50.0000 mg | ORAL_TABLET | Freq: Every day | ORAL | Status: DC
  Administered 2021-01-12: 50 mg via ORAL
  Filled 2021-01-12 (×3): qty 1
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
## 2021-01-12 NOTE — Plan of Care (Signed)
Discussed in brief with Psychiatry team. Per protocol troponin 19 (prior to starting antipsychotic medication) EKG not suggestive of active ischemia or prior infarction. No Coronary Calcium on CT scan 12/25/20. Discussed with primary team red flags to look for concerning signs and symptoms of ischemic disease, biomarker assessment, and red flag indications for ED assessment. Have not seen patient; chart review at the request of provider.  Christell Constant, MD

## 2021-01-12 NOTE — Progress Notes (Signed)
Psychoeducational Group Note  Date:  01/12/2021 Time:  0342  Group Topic/Focus:  Wrap-Up Group:   The focus of this group is to help patients review their daily goal of treatment and discuss progress on daily workbooks.  Participation Level: Did Not Attend  Participation Quality:  Not Applicable  Affect:  Not Applicable  Cognitive:  Not Applicable  Insight:  Not Applicable  Engagement in Group: Not Applicable  Additional Comments:  The patient did not attend group last evening.   Hazle Coca S 01/12/2021, 3:42 AM

## 2021-01-12 NOTE — Progress Notes (Signed)
[facility] MD Progress Note  01/12/2021 1:59 PM Jaishaun Mcnab  MRN:  [id number]   Reason for admission:  Sean Reiseris a 45 year old male with prior diagnoses he is of affect of disorder versus schizophrenia, depression and past marijuana use who was transferred from [facility] medical unit after prolonged hospitalization for recovery from multiple significant self-inflicted injuries in response to command auditory hallucinations in a suicide attempt.   Objective: Medical record reviewed.  Case discussed in detail with members of the treatment team.  I met with and evaluated the patient in his room for follow-up today.  The patient reports that he continues to experience auditory hallucinations or hears messages and environmental sounds that are converted via tinnitus in his left ear.  These voices are messages tell patient that he is going to hell, tell him not to do things that he enjoys, tell him to harm himself.  He denies plan to act on the voices.  Patient denies active suicidal ideation but still has thoughts that it would be easier not to be alive and worries about how he will face the future.  He reports feeling anxious in the group setting and in the day room and states that he is always struggled with social anxiety and avoided being around people.  I encouraged him to spend some time in the day room or in group to help challenge those beliefs and to avoid perpetuating increasing avoidance and worsening social anxiety.  Patient orts he experienced a little lightheadedness and dizziness this morning but denies other medication side effects.  He states he does not feel that the medications have been that effective either in helping his depression or reducing the voices.  He expresses his belief that no medication will help his voices.  The patient initially stated he did not want to undergo clozapine trial, not because of side effects but because he felt clozapine was unlikely to work.   After further discussion of clozapine, the patient agreed to proceed with clozapine trial.  The patient states that no antidepressant has been helpful for him in the past.  He took Lexapro from 2005-2006 which was not effective for his depression.  He took Zoloft in the past which was not effective.  The patient reports that he has taken Remeron for the past month and does not feel it is effective.  We discussed the initiation of clozapine and the plan to decrease the doses of Thorazine and Invega.  We will also decrease mirtazapine dose with a plan to discontinue.  Anticipate initiation of treatment with a different antidepressant early next week.  Patient expressed agreement with this plan.  He slept only 1.75 hours last night.  He has been isolative to his room and not attended many groups.  Nursing notes document that patient has appeared sad with flat affect and continues to report auditory hallucinations.    Principal Problem: Schizoaffective disorder, bipolar type ([place]) Diagnosis: Principal Problem:   Schizoaffective disorder, bipolar type ([place]) Active Problems:   Insomnia   Anxiety disorder, unspecified  Total Time spent with patient: 25 minutes  Past Psychiatric History: See admission H&P  Past Medical History:  Past Medical History:  Diagnosis Date  . Anxiety disorder, unspecified 01/10/2021  . Bipolar disorder ([place])   . Depression   . Schizoaffective disorder [facility])     Past Surgical History:  Procedure Laterality Date  . LAPAROTOMY N/A 12/25/2020   Procedure: EXPLORATORY LAPAROTOMY; REPAIR OF GROIN LACERATIONS X2, RIGHT THIGH LACERATION X 2;  REPAIR RIGHT NECK AND LEFT NECK LACERATION AND REPAIR OF LEFT WRIST LACERATION;  Surgeon: Georganna Skeans, MD;  Location: [place];  Service: General;  Laterality: N/A;  . NO PAST SURGERIES     Family History: History reviewed. No pertinent family history. Family Psychiatric  History: See admission H&P Social History:  Social History    Substance and Sexual Activity  Alcohol Use Not Currently     Social History   Substance and Sexual Activity  Drug Use Yes  . Types: Marijuana    Social History   Socioeconomic History  . Marital status: Single    Spouse name: Not on file  . Number of children: 0  . Years of education: Not on file  . Highest education level: Not on file  Occupational History  . Not on file  Tobacco Use  . Smoking status: Former Smoker    Quit date: 07/31/2014    Years since quitting: 6.4  . Smokeless tobacco: Never Used  Vaping Use  . Vaping Use: Every day  . Substances: THC, CBD  Substance and Sexual Activity  . Alcohol use: Not Currently  . Drug use: Yes    Types: Marijuana  . Sexual activity: Never  Other Topics Concern  . Not on file  Social History Narrative  . Not on file   Social Determinants of Health   Financial Resource Strain: Not on file  Food Insecurity: Not on file  Transportation Needs: Not on file  Physical Activity: Not on file  Stress: Not on file  Social Connections: Not on file   Additional Social History:                         Sleep: Fair  Appetite:  Fair  Current Medications: Current Facility-Administered Medications  Medication Dose Route Frequency Provider Last Rate Last Admin  . acetaminophen (TYLENOL) tablet 650 mg  650 mg Oral Q6H PRN Briant Cedar, MD      . alum & mag hydroxide-simeth (MAALOX/MYLANTA) 200-200-20 MG/5ML suspension 30 mL  30 mL Oral Q4H PRN Briant Cedar, MD      . benztropine (COGENTIN) tablet 0.5 mg  0.5 mg Oral QHS Arthor Captain, MD      . chlorproMAZINE (THORAZINE) tablet 50 mg  50 mg Oral Daily Arthor Captain, MD   50 mg at 01/12/21 0935   And  . chlorproMAZINE (THORAZINE) tablet 100 mg  100 mg Oral QHS Arthor Captain, MD      . cloZAPine (CLOZARIL) tablet 12.5 mg  12.5 mg Oral Q12H Arthor Captain, MD      . docusate sodium (COLACE) capsule 100 mg  100 mg Oral BID Briant Cedar,  MD   100 mg at 01/12/21 0935  . feeding supplement (ENSURE ENLIVE / ENSURE PLUS) liquid 237 mL  237 mL Oral TID BM Briant Cedar, MD   237 mL at 01/12/21 0956  . gabapentin (NEURONTIN) capsule 300 mg  300 mg Oral TID Briant Cedar, MD   300 mg at 01/12/21 1251  . OLANZapine zydis (ZYPREXA) disintegrating tablet 5 mg  5 mg Oral Q8H PRN Briant Cedar, MD       And  . LORazepam (ATIVAN) tablet 1 mg  1 mg Oral PRN Briant Cedar, MD       And  . ziprasidone (GEODON) injection 20 mg  20 mg Intramuscular PRN Briant Cedar, MD      .  magnesium hydroxide (MILK OF MAGNESIA) suspension 30 mL  30 mL Oral Daily PRN Briant Cedar, MD      . mirtazapine (REMERON) tablet 15 mg  15 mg Oral QHS Arthor Captain, MD      . multivitamin with minerals tablet 1 tablet  1 tablet Oral Daily Briant Cedar, MD   1 tablet at 01/12/21 0935  . paliperidone (INVEGA) 24 hr tablet 6 mg  6 mg Oral QHS Arthor Captain, MD      . polyethylene glycol (MIRALAX / GLYCOLAX) packet 17 g  17 g Oral Daily Briant Cedar, MD   17 g at 01/11/21 1165    Lab Results:  Results for orders placed or performed during the hospital encounter of 01/09/21 (from the past 48 hour(s))  C-reactive protein     Status: None   Collection Time: 01/11/21  6:19 PM  Result Value Ref Range   CRP 0.8 <1.0 mg/dL    Comment: Performed at [facility], [place] [address] [place], [place] [address]  CBC with Differential/Platelet     Status: Abnormal   Collection Time: 01/11/21  6:19 PM  Result Value Ref Range   WBC 4.5 4.0 - 10.5 K/uL   RBC 3.44 (L) 4.22 - 5.81 MIL/uL   Hemoglobin 10.1 (L) 13.0 - 17.0 g/dL   HCT 31.9 (L) 39.0 - 52.0 %   MCV 92.7 80.0 - 100.0 fL   MCH 29.4 26.0 - 34.0 pg   MCHC 31.7 30.0 - 36.0 g/dL   RDW 13.7 11.5 - 15.5 %   Platelets 424 (H) 150 - 400 K/uL   nRBC 0.0 0.0 - 0.2 %   Neutrophils Relative % 62 %   Neutro Abs 2.8 1.7 - 7.7 K/uL   Lymphocytes  Relative 24 %   Lymphs Abs 1.1 0.7 - 4.0 K/uL   Monocytes Relative 9 %   Monocytes Absolute 0.4 0.1 - 1.0 K/uL   Eosinophils Relative 4 %   Eosinophils Absolute 0.2 0.0 - 0.5 K/uL   Basophils Relative 1 %   Basophils Absolute 0.0 0.0 - 0.1 K/uL   Immature Granulocytes 0 %   Abs Immature Granulocytes 0.01 0.00 - 0.07 K/uL    Comment: Performed at [facility], [place] [address] [place], [place] [address]  Troponin I (High Sensitivity)     Status: Abnormal   Collection Time: 01/11/21  6:19 PM  Result Value Ref Range   Troponin I (High Sensitivity) 19 (H) <18 ng/L    Comment: (NOTE) Elevated high sensitivity troponin I (hsTnI) values and significant  changes across serial measurements may suggest ACS but many other  chronic and acute conditions are known to elevate hsTnI results.  Refer to the "Links" section for chest pain algorithms and additional  guidance. Performed at [facility], [place] [address], [place], [place] [address]   Brain natriuretic peptide     Status: None   Collection Time: 01/11/21  6:19 PM  Result Value Ref Range   B Natriuretic Peptide 33.5 0.0 - 100.0 pg/mL    Comment: Performed at [facility], [place] [address] [place], [place] [address]    Blood Alcohol level:  Lab Results  Component Value Date   [facility] <10 12/25/2020   ETH <10 04/59/9774    Metabolic Disorder Labs: Lab Results  Component Value Date   HGBA1C 5.2 01/09/2021   MPG 102.54 01/09/2021   MPG 117 06/28/2020   No  results found for: PROLACTIN Lab Results  Component Value Date   CHOL 188 11/18/2020   TRIG 124 11/18/2020   HDL 69 11/18/2020   CHOLHDL 2.7 11/18/2020   VLDL 25 11/18/2020   LDLCALC 94 11/18/2020   LDLCALC 128 (H) 06/28/2020    Physical Findings: AIMS: Facial and Oral Movements Muscles of Facial Expression: None, normal Lips and Perioral Area: None, normal Jaw: None, normal Tongue: None, normal,Extremity  Movements Upper (arms, wrists, hands, fingers): None, normal Lower (legs, knees, ankles, toes): None, normal, Trunk Movements Neck, shoulders, hips: None, normal, Overall Severity Severity of abnormal movements (highest score from questions above): None, normal Incapacitation due to abnormal movements: None, normal Patient's awareness of abnormal movements (rate only patient's report): No Awareness, Dental Status Current problems with teeth and/or dentures?: No Does patient usually wear dentures?: No  CIWA:    COWS:     Musculoskeletal: Strength & Muscle Tone: within normal limits Gait & Station: normal Patient leans: N/A  Psychiatric Specialty Exam:  Presentation  General Appearance: Appropriate for Environment; Casual  Eye Contact:Good  Speech:Clear and Coherent; Normal Rate  Speech Volume:Normal  Handedness:Right   Mood and Affect  Mood:Depressed; Anxious  Affect:Congruent   Thought Process  Thought Processes:Coherent; Goal Directed  Descriptions of Associations:Intact  Orientation:Full (Time, Place and Person)  Thought Content:Rumination; Delusions; Paranoid Ideation (Intrusive obsessive thoughts; delusions of control by outside forces)  History of Schizophrenia/Schizoaffective disorder:Yes  Duration of Psychotic Symptoms:Greater than six months  Hallucinations:Hallucinations: Auditory (Hears voices telling patient he is going to hell or telling him to harm himself or not to do things he enjoys)  Ideas of Reference:Paranoia  Suicidal Thoughts:Suicidal Thoughts: Yes, Passive SI Passive Intent and/or Plan: Without Intent; Without Plan  Homicidal Thoughts:Homicidal Thoughts: No   Sensorium  Memory:Immediate Good; Remote Good; Recent Good  Judgment:Fair  Insight:Fair   Executive Functions  Concentration:Fair  Attention Span:Fair  Recall:Good  Fund of Knowledge:Good  Language:Good   Psychomotor Activity  Psychomotor Activity:Psychomotor  Activity: Normal   Assets  Assets:Communication Skills; Desire for Improvement; Resilience; Social Support   Sleep  Sleep:Sleep: Poor Number of Hours of Sleep: 1.75    Physical Exam: Physical Exam Vitals and nursing note reviewed.  HENT:     Head: Normocephalic and atraumatic.  Pulmonary:     Effort: Pulmonary effort is normal.  Neurological:     General: No focal deficit present.     Mental Status: He is alert and oriented to person, place, and time.    ROS Blood pressure 99/61, pulse 89, temperature (!) 97.5 F (36.4 C), temperature source Oral, resp. rate 16, height 6' (1.829 m), weight 62.1 kg, SpO2 100 %. Body mass index is 18.58 kg/m.   Treatment Plan Summary: Daily contact with patient to assess and evaluate symptoms and progress in treatment and Medication management   Continue every 15-minute observation status  Encouraged participation in group therapy and therapeutic milieu  Psychosis -Start clozapine 12.5 mg every 12 hours with first dose tonight to target psychotic symptoms and reduce suicidality.  Labs from 01/11/2021 include WBC of 4.5 and ANC of 2800.  Baseline troponin mildly elevated 19 which was discussed with cardiology and no concern for initiation of clozapine.  EKG performed 01/12/2021 revealed normal sinus rhythm with ventricular rate of 70 and QT/QTc of 404/436.  Anticipate further upward titration of clozapine as tolerated. -Will need weekly CBC with WBC and ANC with next draw 01/18/2021. -Continue Thorazine at reduced dose of 50 mg daily and 100 mg  at bedtime for psychotic symptoms for now.  Plan is for eventual taper and discontinuation once patient is on a higher clozapine dose -Decrease Invega to 6 mg at bedtime.  Plan is for eventual taper and discontinuation if possible.  Depression/anxiety -Decrease Remeron to 15 mg at bedtime for depression and anxiety as patient feels it has not been effective for depression. -Continue gabapentin 300 mg  3 times daily for anxiety -Anticipate trial of new antidepressant to begin early next week  Constipation -Continue docusate 100 mg twice daily -Continue MOM as needed -Continue MiraLAX daily  EPS -Decrease Cogentin to 0.5 mg at bedtime with plan for eventual discontinuation  Insomnia -Start Ambien $RemoveBefor'5mg'[id number]$  QHS PRN  Disposition planning in progress.    Given severity of patient's illness and seriousness of recent life-threatening self-mutilation and suicide attempt, plan is for patient to remain on [facility] wait list for transfer to [facility].    Arthor Captain, MD 01/12/2021, 1:59 PM

## 2021-01-12 NOTE — Progress Notes (Signed)
PT was notified but did not attend group. 

## 2021-01-12 NOTE — Progress Notes (Signed)
D: Patient presents with sad and flat affect. Patient is positive for passive SI but verbally contracts for safety. Patient denies HI. Patient reports he is still experiencing AH but states they have decreased. Patient denies VH.  A: Provided positive reinforcement and encouragement.  R: Patient cooperative and receptive to efforts. Patient remains safe on the unit.  01/11/21 2052  Psych Admission Type (Psych Patients Only)  Admission Status Voluntary  Psychosocial Assessment  Patient Complaints Anxiety;Sadness  Eye Contact Fair  Facial Expression Flat  Affect Appropriate to circumstance;Sad  Speech Logical/coherent;Soft  Interaction Cautious;Guarded;Minimal  Motor Activity Slow  Appearance/Hygiene Unremarkable  Behavior Characteristics Cooperative;Anxious  Mood Depressed;Sad  Thought Process  Coherency WDL  Content WDL  Delusions None reported or observed  Perception Hallucinations  Hallucination Auditory  Judgment Poor  Confusion None  Danger to Self  Current suicidal ideation? Denies  Danger to Others  Danger to Others None reported or observed  Danger to Others Abnormal  Harmful Behavior to others No threats or harm toward other people  Destructive Behavior No threats or harm toward property

## 2021-01-12 NOTE — Progress Notes (Signed)
NUTRITION ASSESSMENT  Pt identified as at risk on the Malnutrition Screen Tool  INTERVENTION: 1. Supplements: Ensure Enlive po TID, each supplement provides 350 kcal and 20 grams of protein 2. Multivitamin with minerals daily  NUTRITION DIAGNOSIS: Unintentional weight loss related to sub-optimal intake as evidenced by pt report.   Goal: Pt to meet >/= 90% of their estimated nutrition needs.  Monitor:  PO intake  Assessment:  Pt admitted for affective disorder vs. schizophrenia. Pt just transferred from [facility]. During that admission pt was diagnosed with severe malnutrition. Pt last recorded as consuming 75-100% of meals 5/10. Pt was ordered Ensure supplements. Will order these for this stay.   Height: Ht Readings from Last 1 Encounters:  01/09/21 6' (1.829 m)    Weight: Wt Readings from Last 1 Encounters:  01/09/21 62.1 kg    Weight Hx: Wt Readings from Last 10 Encounters:  01/09/21 62.1 kg  12/25/20 63.5 kg  11/07/20 67 kg  10/26/20 68.5 kg  10/06/20 69.9 kg  08/08/20 74.4 kg  06/28/20 67.1 kg  06/28/20 67.1 kg    BMI:  Body mass index is 18.58 kg/m. Pt meets criteria for normal based on current BMI. -borderline underweight  Estimated Nutritional Needs: Kcal: 25-30 kcal/kg Protein: > 1 gram protein/kg Fluid: 1 ml/kcal  Diet Order:  Diet Order    None     Pt is also offered choice of unit snacks mid-morning and mid-afternoon.   Lab results and medications reviewed.   Tilda Franco, MS, RD, LDN Inpatient Clinical Dietitian Contact information available via Amion

## 2021-01-12 NOTE — BHH Group Notes (Signed)
Type of Therapy and Topic:  Group Therapy - Healthy vs Unhealthy Coping Skills  Participation Level:  Active   Description of Group The focus of this group was to determine what unhealthy coping techniques typically are used by group members and what healthy coping techniques would be helpful in coping with various problems. Patients were guided in becoming aware of the differences between healthy and unhealthy coping techniques. Patients were asked to identify 2-3 healthy coping skills they would like to learn to use more effectively.  Therapeutic Goals 1. Patients learned that coping is what human beings do all day long to deal with various situations in their lives 2. Patients defined and discussed healthy vs unhealthy coping techniques 3. Patients identified their preferred coping techniques and identified whether these were healthy or unhealthy 4. Patients determined 2-3 healthy coping skills they would like to become more familiar with and use more often. 5. Patients provided support and ideas to each other   Summary of Patient Progress:  Laden attended group, interacted appropriately during peer discussions, and accepted the worksheets that were provided.

## 2021-01-12 NOTE — BHH Counselor (Signed)
CSW spoke with [facility] Intake and was informed that Mr. Michiels is on the waitlist at [facility].  CSW provided her contact information to be contacted when a bed becomes available.  CSW will fax over information from [facility] to help aid in the admissions process.  CSW will continue to check on admissions throughout each week.

## 2021-01-12 NOTE — Progress Notes (Signed)
Recreation Therapy Notes  Date:  5.13.22 Time: 0930 Location: 300 Hall Dayroom  Group Topic: Stress Management  Goal Area(s) Addresses:  Patient will identify positive stress management techniques. Patient will identify benefits of using stress management post d/c.  Intervention: Stress Management  Activity :  Meditation.  LRT played a meditation that focused on making the most of each opportunity we're given.  The meditation went on to talk about each breath being a fresh start and each day a blank page to start over.  Education:  Stress Management, Discharge Planning.   Education Outcome: Acknowledges Education  Clinical Observations/Feedback: Pt did not attend group session.    Caroll Rancher, LRT/CTRS         Lillia Abed, Caelan Branden A 01/12/2021 11:22 AM

## 2021-01-13 DIAGNOSIS — F250 Schizoaffective disorder, bipolar type: Secondary | ICD-10-CM | POA: Diagnosis not present

## 2021-01-13 MED ORDER — CHLORPROMAZINE HCL 100 MG PO TABS
200.0000 mg | ORAL_TABLET | Freq: Every day | ORAL | Status: DC
  Administered 2021-01-13: 200 mg via ORAL
  Filled 2021-01-13 (×3): qty 2

## 2021-01-13 MED ORDER — BACITRACIN-NEOMYCIN-POLYMYXIN OINTMENT TUBE
TOPICAL_OINTMENT | Freq: Two times a day (BID) | CUTANEOUS | Status: DC
  Administered 2021-01-21 – 2021-02-02 (×8): 1 via TOPICAL
  Filled 2021-01-13 (×3): qty 14.17

## 2021-01-13 MED ORDER — PALIPERIDONE ER 3 MG PO TB24
9.0000 mg | ORAL_TABLET | Freq: Every day | ORAL | Status: DC
  Administered 2021-01-13: 9 mg via ORAL
  Filled 2021-01-13 (×2): qty 3

## 2021-01-13 MED ORDER — CHLORPROMAZINE HCL 100 MG PO TABS
100.0000 mg | ORAL_TABLET | Freq: Every day | ORAL | Status: DC
  Administered 2021-01-13 – 2021-01-14 (×2): 100 mg via ORAL
  Filled 2021-01-13: qty 4
  Filled 2021-01-13 (×4): qty 1

## 2021-01-13 MED ORDER — DIPHENHYDRAMINE HCL 50 MG/ML IJ SOLN
50.0000 mg | Freq: Once | INTRAMUSCULAR | Status: AC
  Administered 2021-01-13: 50 mg via INTRAMUSCULAR
  Filled 2021-01-13 (×2): qty 1

## 2021-01-13 MED ORDER — MELATONIN 5 MG PO TABS
5.0000 mg | ORAL_TABLET | Freq: Every day | ORAL | Status: DC
  Administered 2021-01-13: 5 mg via ORAL
  Filled 2021-01-13 (×3): qty 1

## 2021-01-13 NOTE — Progress Notes (Signed)
D: Patient presents with anxious affect at time of assessment. Patient is positive for passive SI at this time but verbally contracts for safety. Patient is endorsing AH but states that they have decreased from what they were. Patient denies HI/VH at this time.  A: Provided positive reinforcement and encouragement.  R: Patient cooperative and receptive to efforts. Patient remains safe on the unit.  01/12/21 2142  Psych Admission Type (Psych Patients Only)  Admission Status Voluntary  Psychosocial Assessment  Patient Complaints None  Eye Contact Fair  Facial Expression Flat  Affect Anxious  Speech Soft;Incoherent  Interaction Minimal  Motor Activity Slow;Fidgety;Pacing  Appearance/Hygiene Unremarkable  Behavior Characteristics Cooperative;Anxious;Fidgety  Thought Process  Coherency WDL  Content WDL  Delusions None reported or observed  Perception Hallucinations  Hallucination Auditory  Judgment Poor  Confusion None  Danger to Self  Current suicidal ideation? Denies  Danger to Others  Danger to Others None reported or observed

## 2021-01-13 NOTE — Progress Notes (Signed)
Psychoeducational Group Note  Date:  01/13/2021 Time:  2000  Group Topic/Focus:  wrap up group  Participation Level: Did Not Attend  Participation Quality:  Not Applicable  Affect:  Not Applicable  Cognitive:  Not Applicable  Insight:  Not Applicable  Engagement in Group: Not Applicable  Additional Comments:  Did not attend.   Marcille Buffy 01/13/2021, 10:19 PM

## 2021-01-13 NOTE — BHH Group Notes (Signed)
.  Psychoeducational Group Note  Date:01/13/2021 Time: 0900-1000    Goal Setting   Purpose of Group: This group helps to provide patients with the steps of setting a goal that is specific, measurable, attainable, realistic and time specific. A discussion on how we keep ourselves stuck with negative self talk.    Participation Level:  Did not attend   Dione Housekeeper

## 2021-01-13 NOTE — BHH Group Notes (Signed)
.  Psychoeducational Group Note    Date:01/13/2021 Time: 1300-1400    Life Skills:  A group where two lists are made. What people need and what are things that we do that are healthy. The lists are developed by the patients and it is explained that we often do the actions that are not healthy to get our list of needs met.   Purpose of Group: . The group focus' on teaching patients on how to identify their needs and how to develop the coping skills needed to get their needs met  Participation Level:  Did not attend  Participation Quality:  Appropriate  Affect:  Appropriate  Cognitive:  Oriented  Insight:  Improving  Engagement in Group:  Engaged  Additional Comments:  ...  Paulino Rily

## 2021-01-13 NOTE — Progress Notes (Signed)
[facility] MD Progress Note  01/13/2021 3:29 PM Sean Shepherd  MRN:  [id number]   Chief Complaint: suicide attempt, command AH  Reason for Admission: Sean Reiseris a 45 year old male with prior diagnoses of schizoaffective d/o versus schizophrenia, depression and past marijuana use who was transferred from [facility] medical unit after prolonged hospitalization for recovery from multiple significant self-inflicted injuries in response to command auditory hallucinations in a suicide attempt. The patient is currently on Hospital Day 4.   Chart Review from last 24 hours:  The patient's chart was reviewed and nursing notes were reviewed. The patient's case was discussed in multidisciplinary team meeting. Per nursing, overnight the patient became agitated, appeared to be responding to internal stimuli, was pacing, attempted to go into other patient's rooms, took off his abdominal dressing, tore apart a pillow, and urinated on himself. He required PRN po zyprexa X1, Ativan po X1 and IM Benadryl X1 and IM Geodon X1 for agitation and placed on 1:1. Per MAR he was compliant with scheduled medications and did receive Ambien X1 for sleep.   Information Obtained Today During Patient Interview: The patient was seen and evaluated on the unit. On assessment today the patient reports that he does not recall events from overnight. He makes vague reference to belief that his brother "is downstairs" or that his brother "may be in the hospital." Per 1:1 he has not been agitated this morning and has been resting. The patient denies difficulty urinating but admits he has not voided this morning. He denies current pain issues related to his incisional sites or wounds. He has not showered and was prompted to attend to ADLs with assistance of staff. He admits he is feeling sleepy today after he received PRN medications overnight. He denies current AH but is vague as to when he last had AH. He reports that overall he  feels the frequency and intensity of his AH had been decreasing since inpatient admission to [facility]. He denies VH, paranoia, or first rank symptoms. He admits to intermittent "signals" he gets through the light fixtures or sounds on the TV but cannot describes the perceived messages. He states his anxiety and depression are minimally improved and his appetite is "fine." He denies issues with constipation and voices no physical complaints.   Principal Problem: Schizoaffective disorder, bipolar type (HCC) Diagnosis: Principal Problem:   Schizoaffective disorder, bipolar type (HCC) Active Problems:   Insomnia   Anxiety disorder, unspecified  Total Time Spent in Direct Patient Care:  I personally spent 35 minutes on the unit in direct patient care. The direct patient care time included face-to-face time with the patient, reviewing the patient's chart, communicating with other professionals, and coordinating care. Greater than 50% of this time was spent in counseling or coordinating care with the patient regarding goals of hospitalization, psycho-education, and discharge planning needs.  Past Psychiatric History: See admission H&P  Past Medical History:  Past Medical History:  Diagnosis Date  . Anxiety disorder, unspecified 01/10/2021  . Bipolar disorder (HCC)   . Depression   . Schizoaffective disorder [facility])     Past Surgical History:  Procedure Laterality Date  . LAPAROTOMY N/A 12/25/2020   Procedure: EXPLORATORY LAPAROTOMY; REPAIR OF GROIN LACERATIONS X2, RIGHT THIGH LACERATION X 2; REPAIR RIGHT NECK AND LEFT NECK LACERATION AND REPAIR OF LEFT WRIST LACERATION;  Surgeon: Sean Gelinas, MD;  Location[facility] OR;  Service: General;  Laterality: N/A;  . NO PAST SURGERIES     Family History: see admission H&P  Family Psychiatric  History: See admission H&P  Social History:  Social History   Substance and Sexual Activity  Alcohol Use Not Currently     Social History   Substance and Sexual  Activity  Drug Use Yes  . Types: Marijuana    Social History   Socioeconomic History  . Marital status: Single    Spouse name: Not on file  . Number of children: 0  . Years of education: Not on file  . Highest education level: Not on file  Occupational History  . Not on file  Tobacco Use  . Smoking status: Former Smoker    Quit date: 07/31/2014    Years since quitting: 6.4  . Smokeless tobacco: Never Used  Vaping Use  . Vaping Use: Every day  . Substances: THC, CBD  Substance and Sexual Activity  . Alcohol use: Not Currently  . Drug use: Yes    Types: Marijuana  . Sexual activity: Never  Other Topics Concern  . Not on file  Social History Narrative  . Not on file   Social Determinants of Health   Financial Resource Strain: Not on file  Food Insecurity: Not on file  Transportation Needs: Not on file  Physical Activity: Not on file  Stress: Not on file  Social Connections: Not on file   Sleep: Fair  Appetite:  Fair  Current Medications: Current Facility-Administered Medications  Medication Dose Route Frequency Provider Last Rate Last Admin  . acetaminophen (TYLENOL) tablet 650 mg  650 mg Oral Q6H PRN Sean Franklin, MD      . alum & mag hydroxide-simeth (MAALOX/MYLANTA) 200-200-20 MG/5ML suspension 30 mL  30 mL Oral Q4H PRN Sean Franklin, MD      . benztropine (COGENTIN) tablet 0.5 mg  0.5 mg Oral QHS Sean Revering, MD   0.5 mg at 01/12/21 2141  . chlorproMAZINE (THORAZINE) tablet 100 mg  100 mg Oral Daily Sean Shepherd, Sean Bacha E, MD   100 mg at 01/13/21 1010   And  . chlorproMAZINE (THORAZINE) tablet 200 mg  200 mg Oral QHS Sean Hosie E, MD      . cloZAPine (CLOZARIL) tablet 12.5 mg  12.5 mg Oral Q12H Sean Revering, MD   12.5 mg at 01/13/21 1011  . docusate sodium (COLACE) capsule 100 mg  100 mg Oral BID Sean Franklin, MD   100 mg at 01/13/21 1011  . feeding supplement (ENSURE ENLIVE / ENSURE PLUS) liquid 237 mL  237 mL Oral TID BM  Sean Franklin, MD   237 mL at 01/13/21 1013  . gabapentin (NEURONTIN) capsule 300 mg  300 mg Oral TID Sean Franklin, MD   300 mg at 01/13/21 1253  . magnesium hydroxide (MILK OF MAGNESIA) suspension 30 mL  30 mL Oral Daily PRN Sean Franklin, MD      . melatonin tablet 5 mg  5 mg Oral QHS Valari Taylor E, MD      . mirtazapine (REMERON) tablet 15 mg  15 mg Oral QHS Sean Revering, MD   15 mg at 01/12/21 2141  . multivitamin with minerals tablet 1 tablet  1 tablet Oral Daily Sean Franklin, MD   1 tablet at 01/13/21 1011  . OLANZapine zydis (ZYPREXA) disintegrating tablet 5 mg  5 mg Oral Q8H PRN Sean Franklin, MD   5 mg at 01/12/21 2347  . paliperidone (INVEGA) 24 hr tablet 9 mg  9 mg Oral QHS Comer Locket, MD      .  polyethylene glycol (MIRALAX / GLYCOLAX) packet 17 g  17 g Oral Daily Sean Franklin, MD   17 g at 01/13/21 1012    Lab Results:  Results for orders placed or performed during the hospital encounter of 01/09/21 (from the past 48 hour(s))  C-reactive protein     Status: None   Collection Time: 01/11/21  6:19 PM  Result Value Ref Range   CRP 0.8 <1.0 mg/dL    Comment: Performed at [facility], [address] [place], [place] [address]  CBC with Differential/Platelet     Status: Abnormal   Collection Time: 01/11/21  6:19 PM  Result Value Ref Range   WBC 4.5 4.0 - 10.5 K/uL   RBC 3.44 (L) 4.22 - 5.81 MIL/uL   Hemoglobin 10.1 (L) 13.0 - 17.0 g/dL   HCT 73.2 (L) 20.2 - 54.2 %   MCV 92.7 80.0 - 100.0 fL   MCH 29.4 26.0 - 34.0 pg   MCHC 31.7 30.0 - 36.0 g/dL   RDW 70.6 23.7 - 62.8 %   Platelets 424 (H) 150 - 400 K/uL   nRBC 0.0 0.0 - 0.2 %   Neutrophils Relative % 62 %   Neutro Abs 2.8 1.7 - 7.7 K/uL   Lymphocytes Relative 24 %   Lymphs Abs 1.1 0.7 - 4.0 K/uL   Monocytes Relative 9 %   Monocytes Absolute 0.4 0.1 - 1.0 K/uL   Eosinophils Relative 4 %   Eosinophils Absolute 0.2 0.0 - 0.5 K/uL   Basophils  Relative 1 %   Basophils Absolute 0.0 0.0 - 0.1 K/uL   Immature Granulocytes 0 %   Abs Immature Granulocytes 0.01 0.00 - 0.07 K/uL    Comment: Performed at [facility], [address], [place], [place] [address]  Troponin I (High Sensitivity)     Status: Abnormal   Collection Time: 01/11/21  6:19 PM  Result Value Ref Range   Troponin I (High Sensitivity) 19 (H) <18 ng/L    Comment: (NOTE) Elevated high sensitivity troponin I (hsTnI) values and significant  changes across serial measurements may suggest ACS but many other  chronic and acute conditions are known to elevate hsTnI results.  Refer to the "Links" section for chest pain algorithms and additional  guidance. Performed at [facility], [address], [place], [place] [address]   Brain natriuretic peptide     Status: None   Collection Time: 01/11/21  6:19 PM  Result Value Ref Range   B Natriuretic Peptide 33.5 0.0 - 100.0 pg/mL    Comment: Performed at [facility], [address] [place], [place] [address]    Blood Alcohol level:  Lab Results  Component Value Date   ETH <10 12/25/2020   ETH <10 11/18/2020    Metabolic Disorder Labs: Lab Results  Component Value Date   HGBA1C 5.2 01/09/2021   MPG 102.54 01/09/2021   MPG 117 06/28/2020   No results found for: PROLACTIN Lab Results  Component Value Date   CHOL 188 11/18/2020   TRIG 124 11/18/2020   HDL 69 11/18/2020   CHOLHDL 2.7 11/18/2020   VLDL 25 11/18/2020   LDLCALC 94 11/18/2020   LDLCALC 128 (H) 06/28/2020    Physical Findings: AIMS: Facial and Oral Movements Muscles of Facial Expression: None, normal Lips and Perioral Area: None, normal Jaw: None, normal Tongue: None, normal,Extremity Movements Upper (arms, wrists, hands, fingers): None, normal Lower (legs, knees, ankles, toes): None, normal, Trunk  Movements Neck, shoulders, hips: None, normal, Overall Severity Severity of abnormal  movements (highest score from questions above): None, normal Incapacitation due to abnormal movements: None, normal Patient's awareness of abnormal movements (rate only patient's report): No Awareness, Dental Status Current problems with teeth and/or dentures?: No Does patient usually wear dentures?: No      Musculoskeletal: Strength & Muscle Tone: within normal limits Gait & Station: normal, steady Patient leans: N/A  Psychiatric Specialty Exam: Physical Exam Vitals reviewed.  HENT:     Head: Normocephalic.     Comments: Right ear wound healing without erythema or drainage Pulmonary:     Effort: Pulmonary effort is normal.  Skin:    General: Skin is warm and dry.     Comments: Abdominal incision healing and mostly scabbed over at upper border without drainage or erythema except for lower tip of incisional site where scab has been removed and has some crusting; Groin incision is clean, dry, and intact without erythema or scabbing  Neurological:     Mental Status: He is alert.     Review of Systems  Respiratory: Negative for shortness of breath.   Cardiovascular: Negative for chest pain.  Gastrointestinal: Negative for constipation, diarrhea, nausea and vomiting.    Blood pressure (!) 150/80, pulse 89, temperature (!) 97.5 F (36.4 C), temperature source Oral, resp. rate 16, height 6' (1.829 m), weight 62.1 kg, SpO2 100 %.Body mass index is 18.58 kg/m.  General Appearance: casually dressed, fair hygiene  Eye Contact:  Fair  Speech:  Clear and Coherent and Normal Rate  Volume:  Decreased  Mood:  Anxious and Dysphoric  Affect:  Constricted  Thought Process:  Superficially goal directed  Orientation:  Oriented to self, month, year and city  Thought Content:  Denies current AVH, paranoia or first rank symptoms; endorses ideas of reference; makes odd reference to belief that brother is in the building; appears guarded on exam; per staff was confused, agitated and responding to  internal stimuli overnight  Suicidal Thoughts:  No  Homicidal Thoughts:  No  Memory:  Recent;   Poor  Judgement:  Impaired  Insight:  Fair  Psychomotor Activity:  Normal  Concentration:  Concentration: Fair and Attention Span: Fair  Recall:  Poor  Fund of Knowledge:  Fair  Language:  Fair  Akathisia:  Negative  Assets:  Communication Skills Desire for Improvement Resilience Social Support  ADL's:  Independent but requiring prompts  Cognition:  WNL  Sleep:  Number of Hours: 5   Treatment Plan Summary: Daily contact with patient to assess and evaluate symptoms and progress in treatment and Medication management   Continue 1:1 observation status  Encouraged participation in group therapy and therapeutic milieu  Psychosis -Continue clozapine 12.5 mg every 12 hours to target psychotic symptoms and reduce suicidality.  Labs from 01/11/2021 include WBC of 4.5 and ANC of 2800.  Baseline troponin mildly elevated 19 which was discussed with cardiology and no concern for initiation of clozapine.  EKG performed 01/12/2021 revealed normal sinus rhythm with ventricular rate of 70 and QT/QTc of 404/436. Will titrate up as tolerated tomorrow - Questionable reaction overnight to Ambien vs lowering of other antipsychotics with start of Clozaril - will discontinue Ambien and restart Thorazine 100mg  qam and 200mg  qhs and restart Invega 9mg  qhs - goal is for eventual taper and discontinuation of Thorazine and Invega if possible once he is on a higher Clozaril dose.  -Will need weekly CBC with WBC and ANC and weekly troponins with  next draw 01/18/2021.  Depression/anxiety -Continue Remeron 15 mg at bedtime for depression and anxiety  -Continue gabapentin 300 mg 3 times daily for anxiety -Anticipate trial of new antidepressant to begin early next week  Constipation -Continue docusate 100 mg twice daily -Continue MOM as needed -Continue MiraLAX daily  EPS -Continue Cogentin to 0.5 mg at bedtime  with plan for eventual discontinuation given start of Clozaril which is anticholinergic  Insomnia -Start Melatonin 5mg  po qhs and discontinue Ambien  Wound Care -- dry dressing daily and will add neosporin to knee wounds  -- patient encouraged to attend to ADLs and shower - staff to assist -- Checking post-void bladder scan for signs of urinary retention  Disposition planning in progress.    Given severity of patient's illness and seriousness of recent life-threatening self-mutilation and suicide attempt, plan is for patient to remain on [facility] wait list for transfer to [facility].    [facility], MD, FAPA 01/13/2021, 3:29 PM

## 2021-01-13 NOTE — Progress Notes (Signed)
1:1 Note  Pt. In bed MHT present. Safety maintained. 1:1 safety continued.

## 2021-01-13 NOTE — [facility] Group Notes (Signed)
[facility] LCSW Group Therapy Note  Date/Time:    01/13/2021 11:00AM-12:00PM  Type of Therapy and Topic:  Group Therapy:  Healthy vs Unhealthy Coping Skills  Participation Level:  Did Not Attend   Description of Group:  The focus of this group was to determine what unhealthy and healthy coping techniques typically are used by group members and why they tend to fall back on those particular techniques of handling hard situations. Patients were guided in becoming aware of the differences between healthy and unhealthy coping techniques.  Facilitator led a discussion about the benefits and costs of returning to old unhealthy coping techniques, as well as the benefits and costs of learning new healthier coping skills.  Therapeutic Goals 1. Patients learned that coping is what human beings do all day long to deal with various situations in their lives 2. Patients defined and discussed healthy vs unhealthy coping techniques 3. Patients came to understand the the reasons human beings often return to old coping techniques that they know are unhelpful 4. Patients were provided with motivation to consider new means of coping that may be more healthy and helpful 5. Patients provided support and ideas to each other  Summary of Patient Progress: N/A   Therapeutic Modalities Psychoeducation Motivational Interviewing   Ambrose Mantle, LCSW 01/13/2021, 4:37 PM

## 2021-01-13 NOTE — Progress Notes (Signed)
After bed time patient became increasingly anxious and began to pace back and forth. RN administered PRN zyprexa 5mg . Approximately an hour later patient can be seen pacing the hallway and attempting to open the door at the end of the hall. Patient appears confused and anxious and appears to be responding to internal stimuli. Patient attempted to go into other patients' rooms and required redirection of behavior by MHTs. RN administered  PRN 1mg  ativan approximately 1 hour after administration of PRN zyprexa 5mg . Patient continued to pace hallway and attempted to leave the unit. Patient took abdominal gauze dressings off. Patient tore apart pillow in room and urinated on himself. RN called NP to assess patient. Order for 50mg  Benadryl IM received and instructed by NP to give 20mg  Geodon IM. Patient continued to pace in room and leave room after administration of IM medications. Patient appears fidgety, restless, and confused. Patient's speech is incoherent. Order obtained from NP for 1:1 observation for patient due to confusion and need for redirection. Patient currently asleep in bed at this time. Sitter at bedside. Will continue to monitor and maintain safety with 1:1 observation.

## 2021-01-13 NOTE — Progress Notes (Signed)
1:1 Note  Patient sitting up in bed talking to male MHT. Safety maintained. 1:1 continues

## 2021-01-14 DIAGNOSIS — F250 Schizoaffective disorder, bipolar type: Principal | ICD-10-CM

## 2021-01-14 MED ORDER — LORAZEPAM 1 MG PO TABS
1.0000 mg | ORAL_TABLET | ORAL | Status: DC | PRN
  Filled 2021-01-14: qty 1

## 2021-01-14 MED ORDER — ZIPRASIDONE MESYLATE 20 MG IM SOLR: 20.0000 mg | INTRAMUSCULAR | Status: DC | PRN

## 2021-01-14 MED ORDER — MELATONIN 5 MG PO TABS
5.0000 mg | ORAL_TABLET | Freq: Every evening | ORAL | Status: DC | PRN
  Administered 2021-01-19: 5 mg via ORAL
  Filled 2021-01-14: qty 1

## 2021-01-14 MED ORDER — CLOZAPINE 25 MG PO TABS
25.0000 mg | ORAL_TABLET | Freq: Two times a day (BID) | ORAL | Status: DC
  Administered 2021-01-14 – 2021-01-15 (×4): 25 mg via ORAL
  Filled 2021-01-14 (×9): qty 1

## 2021-01-14 MED ORDER — OLANZAPINE 5 MG PO TBDP
5.0000 mg | ORAL_TABLET | Freq: Three times a day (TID) | ORAL | Status: DC | PRN
  Administered 2021-01-14 – 2021-01-21 (×3): 5 mg via ORAL
  Filled 2021-01-14 (×3): qty 1

## 2021-01-14 MED ORDER — CHLORPROMAZINE HCL 100 MG PO TABS
100.0000 mg | ORAL_TABLET | Freq: Every day | ORAL | Status: DC
  Administered 2021-01-14 – 2021-01-15 (×2): 100 mg via ORAL
  Filled 2021-01-14 (×2): qty 1
  Filled 2021-01-14: qty 4
  Filled 2021-01-14: qty 1

## 2021-01-14 MED ORDER — CHLORPROMAZINE HCL 100 MG PO TABS
100.0000 mg | ORAL_TABLET | Freq: Every day | ORAL | Status: DC
  Administered 2021-01-15: 100 mg via ORAL
  Filled 2021-01-14 (×3): qty 1

## 2021-01-14 MED ORDER — PALIPERIDONE ER 6 MG PO TB24
6.0000 mg | ORAL_TABLET | Freq: Every day | ORAL | Status: DC
  Administered 2021-01-14: 6 mg via ORAL
  Filled 2021-01-14 (×4): qty 1

## 2021-01-14 NOTE — Progress Notes (Signed)
1:1 note  Pt found in bed; compliant with medication administration. Pt presents anxious, fidgety, depressed, and with poor eye contact. Pt still complains of nightmares. Pt denies any physical complaints. Pt states they will tell nursing when they shower for dressing changes. Pt denies any physical pain. Pt safe on the unit. q53m safety checks implemented and continued. Will continue to monitor. Pt denies current si/hi/vh and verbally agrees to approach staff if these become apparent and/or before harming self/others while at [facility]. Pt does still voice auditory hallucinations but contracts for safety with these. 1:1 continues for safety.

## 2021-01-14 NOTE — Progress Notes (Addendum)
[facility]  01/14/2021 1:16 PM Sean Shepherd  MRN:  [id number]   Chief Complaint: suicide attempt, command AH  Reason for Admission: Moss Reiseris a 45 year old male with prior diagnoses of schizoaffective d/o versus schizophrenia, depression and past marijuana use who [facility] after prolonged hospitalization for recovery from multiple significant self-inflicted injuries in response to command auditory hallucinations in a suicide attempt. The patient is currently on Hospital Day 4.   Chart Review from last 24 hours:  The patient's chart was reviewed and nursing notes were reviewed. The patient's case was discussed in multidisciplinary team meeting. Per nursing, the patient remained on 1:1 yesterday and had no further behavioral issues or safety concerns noted. Per bladder scan he did not show signs of urinary retention. He remained withdrawn and did not attend groups. Per MAR, he was compliant with scheduled medications and did not require PRNs yesterday during the day or overnight for agitation.   Information Obtained Today During Patient Interview: The patient was seen and evaluated on the unit. He is resting in bed with 1:1 present. He states he is feeling more sedated and fatigued today, and we discussed that this is likely due to titration up on his Clozaril this morning as well as restart of his previous antipsychotic doses yesterday after his decompensation night before last. I also discussed that night before last he also was given multiple PRN medications for agitation and psychosis and likely was tired yesterday due to effects of these medications. He states he slept fair overnight and did sleep off and on throughout the day yesterday. He reports fair appetite and states he is urinating regularly and is moving his bowels. He got a shower yesterday. He states he still has some AH that are "random" in frequency but non-command in  nature. He will not discuss content of AH. He reports intermittent belief in thought broadcasting but denies recent ideas of reference or thought insertion/withdrawal.  He states he has been dreaming but not having true VH. He denies paranoia on the unit. He admits he was more confused yesterday after getting multiple PRN medications the night prior, but he feels his thoughts are clearer today. He states his pain is well controlled at this time. He is oriented today.  Principal Problem: Schizoaffective disorder, bipolar type (HCC) Diagnosis: Principal Problem:   Schizoaffective disorder, bipolar type (HCC) Active Problems:   Insomnia   Anxiety disorder, unspecified  Total Time Spent in Direct Patient Care:  I personally spent 30 minutes on the unit in direct patient care. The direct patient care time included face-to-face time with the patient, reviewing the patient's chart, communicating with other professionals, and coordinating care. Greater than 50% of this time was spent in counseling or coordinating care with the patient regarding goals of hospitalization, psycho-education, and discharge planning needs.  Past Psychiatric History: See admission H&P  Past Medical History:  Past Medical History:  Diagnosis Date  . Anxiety disorder, unspecified 01/10/2021  . Bipolar disorder (HCC)   . Depression   . Schizoaffective disorder [facility](HCC)     Past Surgical History:  Procedure Laterality Date  . LAPAROTOMY N/A 12/25/2020   Procedure: EXPLORATORY LAPAROTOMY; REPAIR OF GROIN LACERATIONS X2, RIGHT THIGH LACERATION X 2; REPAIR RIGHT NECK AND LEFT NECK LACERATION AND REPAIR OF LEFT WRIST LACERATION;  Surgeon: Violeta Gelinashompson, Burke, MD;  [facility];  Service: General;  Laterality: N/A;  . NO PAST SURGERIES     Family History: see  admission H&P  Family Psychiatric  History: See admission H&P  Social History:  Social History   Substance and Sexual Activity  Alcohol Use Not Currently     Social  History   Substance and Sexual Activity  Drug Use Yes  . Types: Marijuana    Social History   Socioeconomic History  . Marital status: Single    Spouse name: Not on file  . Number of children: 0  . Years of education: Not on file  . Highest education level: Not on file  Occupational History  . Not on file  Tobacco Use  . Smoking status: Former Smoker    Quit date: 07/31/2014    Years since quitting: 6.4  . Smokeless tobacco: Never Used  Vaping Use  . Vaping Use: Every day  . Substances: THC, CBD  Substance and Sexual Activity  . Alcohol use: Not Currently  . Drug use: Yes    Types: Marijuana  . Sexual activity: Never  Other Topics Concern  . Not on file  Social History Narrative  . Not on file   Social Determinants of Health   Financial Resource Strain: Not on file  Food Insecurity: Not on file  Transportation Needs: Not on file  Physical Activity: Not on file  Stress: Not on file  Social Connections: Not on file   Sleep: Fair  Appetite:  Fair  Current Medications: Current Facility-Administered Medications  Medication Dose Route Frequency Provider Last Rate Last Admin  . acetaminophen (TYLENOL) tablet 650 mg  650 mg Oral Q6H PRN Lauro Franklin, MD      . alum & mag hydroxide-simeth (MAALOX/MYLANTA) 200-200-20 MG/5ML suspension 30 mL  30 mL Oral Q4H PRN Lauro Franklin, MD      . benztropine (COGENTIN) tablet 0.5 mg  0.5 mg Oral QHS Claudie Revering, MD   0.5 mg at 01/13/21 2223  . chlorproMAZINE (THORAZINE) tablet 100 mg  100 mg Oral Daily Mason Jim, Nikki Glanzer E, MD   100 mg at 01/14/21 6578   And  . chlorproMAZINE (THORAZINE) tablet 200 mg  200 mg Oral QHS Mason Jim, Alizah Sills E, MD   200 mg at 01/13/21 2223  . cloZAPine (CLOZARIL) tablet 25 mg  25 mg Oral Q12H Comer Locket, MD   25 mg at 01/14/21 0809  . docusate sodium (COLACE) capsule 100 mg  100 mg Oral BID Lauro Franklin, MD   100 mg at 01/14/21 0809  . feeding supplement (ENSURE ENLIVE /  ENSURE PLUS) liquid 237 mL  237 mL Oral TID BM Lauro Franklin, MD   237 mL at 01/14/21 1042  . gabapentin (NEURONTIN) capsule 300 mg  300 mg Oral TID Lauro Franklin, MD   300 mg at 01/14/21 0809  . magnesium hydroxide (MILK OF MAGNESIA) suspension 30 mL  30 mL Oral Daily PRN Lauro Franklin, MD      . melatonin tablet 5 mg  5 mg Oral QHS Comer Locket, MD   5 mg at 01/13/21 2224  . mirtazapine (REMERON) tablet 15 mg  15 mg Oral QHS Claudie Revering, MD   15 mg at 01/13/21 2224  . multivitamin with minerals tablet 1 tablet  1 tablet Oral Daily Lauro Franklin, MD   1 tablet at 01/14/21 0809  . neomycin-bacitracin-polymyxin (NEOSPORIN) ointment   Topical BID Comer Locket, MD   Given at 01/13/21 1626  . OLANZapine zydis (ZYPREXA) disintegrating tablet 5 mg  5 mg Oral Q8H PRN Pashayan,  Mardelle Matte, MD   5 mg at 01/12/21 2347  . paliperidone (INVEGA) 24 hr tablet 6 mg  6 mg Oral QHS Creta Dorame E, MD      . polyethylene glycol (MIRALAX / GLYCOLAX) packet 17 g  17 g Oral Daily Lauro Franklin, MD   17 g at 01/13/21 1012    Lab Results:  No results found for this or any previous visit (from the past 48 hour(s)).  Blood Alcohol level:  Lab Results  Component Value Date   ETH <10 12/25/2020   ETH <10 11/18/2020    Metabolic Disorder Labs: Lab Results  Component Value Date   HGBA1C 5.2 01/09/2021   MPG 102.54 01/09/2021   MPG 117 06/28/2020   No results found for: PROLACTIN Lab Results  Component Value Date   CHOL 188 11/18/2020   TRIG 124 11/18/2020   HDL 69 11/18/2020   CHOLHDL 2.7 11/18/2020   VLDL 25 11/18/2020   LDLCALC 94 11/18/2020   LDLCALC 128 (H) 06/28/2020    Physical Findings: AIMS: Facial and Oral Movements Muscles of Facial Expression: None, normal Lips and Perioral Area: None, normal Jaw: None, normal Tongue: None, normal,Extremity Movements Upper (arms, wrists, hands, fingers): None, normal Lower (legs, knees, ankles,  toes): None, normal, Trunk Movements Neck, shoulders, hips: None, normal, Overall Severity Severity of abnormal movements (highest score from questions above): None, normal Incapacitation due to abnormal movements: None, normal Patient's awareness of abnormal movements (rate only patient's report): No Awareness, Dental Status Current problems with teeth and/or dentures?: No Does patient usually wear dentures?: No      Musculoskeletal: Strength & Muscle Tone: within normal limits Gait & Station: untested, resting in bed Patient leans: N/A  Psychiatric Specialty Exam: Physical Exam Vitals reviewed.  HENT:     Head: Normocephalic.  Pulmonary:     Effort: Pulmonary effort is normal.  Neurological:     General: No focal deficit present.     Mental Status: He is alert.     Review of Systems  Respiratory: Negative for shortness of breath.   Cardiovascular: Negative for chest pain.  Gastrointestinal: Negative for constipation, diarrhea, nausea and vomiting.    Blood pressure 98/72, pulse (!) 110, temperature 98.1 F (36.7 C), temperature source Oral, resp. rate 18, height 6' (1.829 m), weight 62.1 kg, SpO2 97 %.Body mass index is 18.58 kg/m.  General Appearance: casually dressed, improved hygiene  Eye Contact:  Minimal  Speech:  Clear and Coherent and Normal Rate  Volume:  Decreased  Mood:  Anxious and Dysphoric  Affect:  Constricted, guarded  Thought Process:  Superficially goal directed and linear  Orientation:  Oriented to self, month, year and city  Thought Content:  Reports non-command AH but denies VH; denies ideas of reference or thought insertion/withdrawal but endorses belief in thought broadcasting; appears guarded but is not grossly responding to internal/external stimuli on exam  Suicidal Thoughts:  No  Homicidal Thoughts:  No  Memory:  Fair  Judgement:  Fair  Insight:  Fair  Psychomotor Activity:  Normal  Concentration:  Concentration: Fair and Attention Span:  Fair  Recall:  [employer] of Knowledge:  Fair  Language:  Fair  Akathisia:  Negative  Assets:  Communication Skills Desire for Improvement Resilience Social Support  ADL's:  Independent but requiring prompts  Cognition:  WNL  Sleep:  Number of Hours: 6.45   Treatment Plan Summary: Daily contact with patient to assess and evaluate symptoms and progress in treatment and  Medication management   Continue 1:1 observation status  Encouraged participation in group therapy and therapeutic milieu  Psychosis -Increase clozapine to 25 mg every 12 hours to target psychotic symptoms and reduce suicidality.  Labs from 01/11/2021 include WBC of 4.5 and ANC of 2800.  Baseline troponin mildly elevated 19 which was discussed with cardiology and no concern for initiation of clozapine.  EKG performed 01/12/2021 revealed normal sinus rhythm with ventricular rate of 70 and QT/QTc of 404/436. -Will need weekly CBC with WBC and ANC and weekly troponins with next draw 01/18/2021 and monitoring for orthostatic BP drops with start of Clozapine - pushing po fluids - Continue Thorazine 100mg  qam and reduce to Thorazine 100mg  qhs tonight with plans to gradually continue reducing the dose as his Clozapine is increased. - Reduce Invega to 6mg  po qhs tonight with plans to gradually continue reducing the dose as his Clozapine is increased - Encouraging he be out of bed to chair tid and that he ambulate with fall precautions/assistance while he adjusts to sedating medication changes  Depression/anxiety -Continue Remeron 15 mg at bedtime for depression and anxiety  -Continue gabapentin 300 mg 3 times daily for anxiety -Anticipate trial of new antidepressant to begin early next week  Constipation -Continue docusate 100 mg twice daily -Continue MOM as needed -Continue MiraLAX daily  EPS -Continue Cogentin to 0.5 mg at bedtime with plan for eventual discontinuation given start of Clozaril which is  anticholinergic  Insomnia -Continue Melatonin 5mg  po qhs but  Make PRN due to recent oversedation - Held Ambien in the event it contributed to behavioral decompensation 2 nights ago  Wound Care -- dry dressing daily and will add neosporin to knee wounds  -- patient encouraged to attend to ADLs and shower - staff to assist -- Monitoring for signs of urinary retention   Disposition planning in progress.    Given severity of patient's illness and seriousness of recent life-threatening self-mutilation and suicide attempt, plan is for patient to remain on [facility] wait list for transfer to [facility].    , MD, FAPA 01/14/2021, 1:16 PM

## 2021-01-14 NOTE — Progress Notes (Signed)
Psychoeducational Group Note  Date:  01/14/2021 Time:  2015  Group Topic/Focus:  wrap up group  Participation Level: Did Not Attend  Participation Quality:  Not Applicable  Affect:  Not Applicable  Cognitive:  Not Applicable  Insight:  Not Applicable  Engagement in Group: Not Applicable  Additional Comments:  Did not attend.   Marcille Buffy 01/14/2021, 10:17 PM

## 2021-01-14 NOTE — Progress Notes (Signed)
   01/14/21 0600  Psychosocial Assessment  Patient Complaints None  Eye Contact Fair  Facial Expression Flat  Affect Anxious  Speech Soft;Incoherent  Interaction Minimal  Motor Activity Slow;Fidgety;Pacing  Appearance/Hygiene Unremarkable  Thought Process  Coherency WDL  Content WDL  Delusions None reported or observed  Perception Hallucinations  Hallucination Auditory  Judgment Poor  Confusion None  Danger to Self  Current suicidal ideation? Denies  Danger to Others  Danger to Others None reported or observed   D: Patient is alert and oriented x 4. Patient denies SI/HI/ AVH and pain. Disposition is Calm and cooperative with appropriate affect. Verbally contracts for safety to this Clinical research associate.   A:  Pt was given scheduled medications. Pt was encourage to attend groups. Pt remain on 1:1 observation. Pt was withdrawn and reserved during the shift. Pt was offered support and encouragement by this Clinical research associate.  Pt complied with scheduled medications. No signs of distress nor concerns reported by patient at present.Pt remains receptive to treatment and safety maintained on unit. Pt refused dressing change and skin areas warm, dry and intact on observation and assessment.   R: Will continue to monitor and assess. Safety maintained during this shift.

## 2021-01-14 NOTE — Plan of Care (Signed)
  Problem: Health Behavior/Discharge Planning: Goal: Ability to make decisions will improve Outcome: Progressing Goal: Compliance with therapeutic regimen will improve Outcome: Progressing   Problem: Role Relationship: Goal: Will demonstrate positive changes in social behaviors and relationships Outcome: Progressing   

## 2021-01-14 NOTE — [facility] Group Notes (Signed)
Adult Psychoeducational Group Not Date:  01/14/2021 Time:  0900-1045 Group Topic/Focus: PROGRESSIVE RELAXATION. A group where deep breathing is taught and tensing and relaxation muscle groups is used. Imagery is used as well.  Pts are asked to imagine 3 pillars that hold them up when they are not able to hold themselves up.  Participation Level:  Did not attend   [place] A   

## 2021-01-14 NOTE — Progress Notes (Signed)
1:1 note  Pt has been resting since last encounter. Vitals obtained. Pt provided nourishment. Fluids encouraged. No signs of distress noted. Pt safe on the unit. q63m safety checks implemented and continued. Will continue to monitor. 1:1 continues for safety.

## 2021-01-14 NOTE — Progress Notes (Signed)
Pharmacy-Clozapine  This patient's order has been reviewed for prescribing contraindications.   Labs: ANC = 2800 drawn 01/11/21  The medication is being dispensed pursuant to the FDA REMS suspension dispense authorization (RDA)  Etta Quill, RPh

## 2021-01-14 NOTE — Progress Notes (Signed)
1:1 note  Pt has been resting most of the afternoon. MHT sitting with pt states they have ambulated a few times. Pt encouraged fluids and nutrition. Pt safe on the unit. q66m safety checks implemented and continued. Will continue to monitor. 1:1 continues for safety. Sitter within arms reach of pt.

## 2021-01-15 MED ORDER — DOCUSATE SODIUM 100 MG PO CAPS
100.0000 mg | ORAL_CAPSULE | Freq: Once | ORAL | Status: AC
  Administered 2021-01-15: 100 mg via ORAL
  Filled 2021-01-15: qty 1

## 2021-01-15 MED ORDER — PALIPERIDONE ER 3 MG PO TB24
3.0000 mg | ORAL_TABLET | Freq: Every day | ORAL | Status: DC
  Administered 2021-01-15: 3 mg via ORAL
  Filled 2021-01-15 (×2): qty 1

## 2021-01-15 MED ORDER — DOCUSATE SODIUM 100 MG PO CAPS
200.0000 mg | ORAL_CAPSULE | Freq: Two times a day (BID) | ORAL | Status: DC
  Administered 2021-01-16 – 2021-01-29 (×26): 200 mg via ORAL
  Filled 2021-01-15 (×32): qty 2

## 2021-01-15 MED FILL — Neomycin-Bacitracin-Polymyxin Oint: CUTANEOUS | Qty: 14.17 | Status: AC

## 2021-01-15 NOTE — Progress Notes (Signed)
Nursing 1:1 note D:Pt standing at 400 hall med window with sitter. RR even and unlabored. No distress noted. A: 1:1 observation continues for safety  R: Pt remains safe

## 2021-01-15 NOTE — Progress Notes (Signed)
1:1 Observation  Pt is alert and oriented x 4. Interacted appropiatly with staff. No signs of distress nor concerns reported at present. Safety maintained.

## 2021-01-15 NOTE — Progress Notes (Signed)
1:1  Pt. Walked down to [employer] with MHT. Pt. Safety maintained. Continue 1:1.

## 2021-01-15 NOTE — Progress Notes (Addendum)
   01/15/21 0500  Psych Admission Type (Psych Patients Only)  Admission Status Voluntary  Psychosocial Assessment  Patient Complaints Anxiety  Eye Contact Fair  Facial Expression Anxious;Pensive  Affect Anxious;Preoccupied  Speech Logical/coherent  Interaction Assertive  Motor Activity Slow  Appearance/Hygiene Unremarkable  Thought Process  Coherency WDL  Content WDL  Delusions None reported or observed  Perception Hallucinations  Hallucination Auditory  Judgment Poor  Confusion None  Danger to Self  Current suicidal ideation? Denies  Danger to Others  Danger to Others None reported or observed

## 2021-01-15 NOTE — [facility] Group Notes (Signed)
Patient did not attend group   ADULT GRIEF GROUP NOTE:   Spiritual care group on grief and loss facilitated by chaplain Katy Hanna Ra, [facility]   Group Goal:   Support / Education around grief and loss   Members engage in facilitated group support and psycho-social education.   Group Description:   Following introductions and group rules, group members engaged in facilitated group dialog and support around topic of loss, with particular support around experiences of loss in their lives. Group Identified types of loss (relationships / self / things) and identified patterns, circumstances, and changes that precipitate losses. Reflected on thoughts / feelings around loss, normalized grief responses, and recognized variety in grief experience. Group noted Worden's four tasks of grief in discussion.   Group drew on Adlerian / Rogerian, narrative, MI,    

## 2021-01-15 NOTE — Progress Notes (Addendum)
Recreation Therapy Notes  Date: 5.16.22 Time: 0930 Location: [address] Dayroom  Group Topic: Stress Management   Goal Area(s) Addresses:  Patient will actively participate in stress management techniques presented during session.  Patient will successfully identify benefit of practicing stress management post d/c.   Intervention: Guided exercise with ambient sound and script  Activity :Guided Imagery  LRT read a script that dealt with patients visualizing their peaceful place.  This is a place where can have peace, relax and escape from the things they are dealing for a few moments.  Education:  Stress Management, Discharge Planning.   Education Outcome: Acknowledges education  Clinical Observations/Feedback: Patient did not attend group session.     Caroll Rancher, LRT/CTRS         Caroll Rancher A 01/15/2021 10:48 AM

## 2021-01-15 NOTE — [facility] Group Notes (Addendum)
Type of Therapy and Topic:Group Therapy: Effective Communication  Participation Level:Attended  Description of Group:  In this group patients will be asked to identify their own styles of communication as well as defining and identifying passive, assertive, and aggressive styles of communication. Participants will identify strategies to communicate in a more assertive manner in an effort to appropriately meet their needs. This group will be process-oriented, with patients participating in exploration of their own experiences as well as giving and receiving support and challenge from other group members.  Therapeutic Goals: 1. Patient will identify their personal communication style. 2. Patient will identify passive, assertive, and aggressive forms of communication. 3. Patient will identify strategies for developing more effective communication to appropriately meet their needs.   Summary of Patient Progress: CSW provided Sean Shepherd with a packet of worksheets and encouraged pt to asks questions 1:1 if any presented.   Therapeutic Modalities:  Communication Skills Solution Focused Therapy Motivational Interviewing

## 2021-01-15 NOTE — Progress Notes (Signed)
!:  1  Pt. In room with tech. Knee bandages replaced and ointment applied. Safety maintained 1:1 continues.

## 2021-01-15 NOTE — [facility] Group Notes (Signed)
Occupational Therapy Group Note Date: 01/15/2021 Group Topic/Focus: Stress Management  Group Description: Group encouraged increased participation and engagement through discussion focused on topic of stress management. Patients engaged interactively to discuss components of stress including physical signs, emotional signs, negative management strategies, and positive management strategies. Each individual identified one new stress management strategy they would like to try moving forward.    Therapeutic Goals: Identify current stressors Identify healthy vs unhealthy stress management strategies/techniques Discuss and identify physical and emotional signs of stress Participation Level: Active   Participation Quality: Independent   Behavior: Cooperative and Guarded   Speech/Thought Process: Directed   Affect/Mood: Constricted and Sad   Insight: Fair   Judgement: Fair   Individualization: Sean Shepherd was active in their participation of group discussion/activity. Pt identified several strategies to manage stressors, both healthy vs unhealthy. Appeared receptive to education and further suggestions/strategies provided by peers. Pt identified "not knowing the future or what is going to happen next" as something that is currently stressful to him.   Modes of Intervention: Activity, Discussion, Education and Support  Patient Response to Interventions:  Attentive, Engaged, Receptive and Interested   Plan: Continue to engage patient in OT groups 2 - 3x/week.  01/15/2021  Donne Hazel, MOT, OTR/L

## 2021-01-15 NOTE — Progress Notes (Signed)
[facility] MD Progress Note  01/15/2021 6:20 PM Sean Shepherd  MRN:  [id number]    Reason for admission:  Dahl Reiseris a 45 year old male with prior diagnoses he is of affect of disorder versus schizophrenia, depression and past marijuana use who was transferred from [facility] medical unit after prolonged hospitalization for recovery from multiple significant self-inflicted injuries in response to command auditory hallucinations in a suicide attempt.   Objective: Medical record reviewed. Case discussed in detail with members of the treatment team.  MD and medical student met with and evaluated the patient in his room for follow-up today. Sean Shepherd reports continued auditory hallucinations of voices that he receives through tinnitus in his left ear telling him not to eat and that he's going to hell.  At times he experiences these as voices but other times he experiences these as intrusive thoughts.  Sean Shepherd endorses a decrease in appetite and states that this is why he is not eating, not because of the voices. He reports "social anxiety", stating that he can't be in the group room/day room. We again encouraged him to spend some time in the day room to avoid perpetuating avoidance and worsening social anxiety as discussed last week. Sean Shepherd endorses some lightheadedness on standing that is less noticeable when he's walking around. He also endorses dry mouth, and states he hasn't had a bowel movement for a few days after they were fairly regular last week at [facility] and after admission to [facility]--stool softener ordered for improvement. He denies other medication side effects. Discussed change of admission status to IVC today for logistics of possible transfer to longer-term facility if needed. Sean Shepherd endorsed understanding of this change and the reasons for it, questions answered. Clarified that this does not mean he will not be discharged with the treatment team feels he is ready. Nursing notes  indicate Sean Shepherd has attended group therapy sessions today with active participation in one group this afternoon.  The patient slept 6.75 hours last night.  Vital signs this morning were BP of 98/72, pulse of 110, respirations of 18 and temperature of 98.1.  The patient remains with one-to-one sitter in his room.  He has generally been seclusive to his room and has not been attending groups.  He does go to the cafeteria to eat meals.  Nursing staff report that wounds are dry without signs of infection.   Principal Problem: Schizoaffective disorder, bipolar type ([place]) Diagnosis: Principal Problem:   Schizoaffective disorder, bipolar type ([place]) Active Problems:   Insomnia   Anxiety disorder, unspecified  Total Time spent with patient: 30 minutes  Past Psychiatric History: See admission H&P  Past Medical History:  Past Medical History:  Diagnosis Date  . Anxiety disorder, unspecified 01/10/2021  . Bipolar disorder ([place])   . Depression   . Schizoaffective disorder [facility])     Past Surgical History:  Procedure Laterality Date  . LAPAROTOMY N/A 12/25/2020   Procedure: EXPLORATORY LAPAROTOMY; REPAIR OF GROIN LACERATIONS X2, RIGHT THIGH LACERATION X 2; REPAIR RIGHT NECK AND LEFT NECK LACERATION AND REPAIR OF LEFT WRIST LACERATION;  Surgeon: Georganna Skeans, MD;  Location: [facility];  Service: General;  Laterality: N/A;  . NO PAST SURGERIES     Family History: History reviewed. No pertinent family history. Family Psychiatric  History: See admission H&P Social History:  Social History   Substance and Sexual Activity  Alcohol Use Not Currently     Social History   Substance and Sexual Activity  Drug Use Yes  . Types:  Marijuana    Social History   Socioeconomic History  . Marital status: Single    Spouse name: Not on file  . Number of children: 0  . Years of education: Not on file  . Highest education level: Not on file  Occupational History  . Not on file  Tobacco Use  . Smoking  status: Former Smoker    Quit date: 07/31/2014    Years since quitting: 6.4  . Smokeless tobacco: Never Used  Vaping Use  . Vaping Use: Every day  . Substances: THC, CBD  Substance and Sexual Activity  . Alcohol use: Not Currently  . Drug use: Yes    Types: Marijuana  . Sexual activity: Never  Other Topics Concern  . Not on file  Social History Narrative  . Not on file   Social Determinants of Health   Financial Resource Strain: Not on file  Food Insecurity: Not on file  Transportation Needs: Not on file  Physical Activity: Not on file  Stress: Not on file  Social Connections: Not on file   Additional Social History:                         Sleep: Fair  Appetite:  Poor  Current Medications: Current Facility-Administered Medications  Medication Dose Route Frequency Provider Last Rate Last Admin  . acetaminophen (TYLENOL) tablet 650 mg  650 mg Oral Q6H PRN Briant Cedar, MD      . alum & mag hydroxide-simeth (MAALOX/MYLANTA) 200-200-20 MG/5ML suspension 30 mL  30 mL Oral Q4H PRN Briant Cedar, MD      . benztropine (COGENTIN) tablet 0.5 mg  0.5 mg Oral QHS Arthor Captain, MD   0.5 mg at 01/14/21 2130  . chlorproMAZINE (THORAZINE) tablet 100 mg  100 mg Oral QHS Nelda Marseille, Amy E, MD   100 mg at 01/14/21 2130   And  . chlorproMAZINE (THORAZINE) tablet 100 mg  100 mg Oral Daily Harlow Asa, MD   100 mg at 01/15/21 9767  . cloZAPine (CLOZARIL) tablet 25 mg  25 mg Oral Q12H Harlow Asa, MD   25 mg at 01/15/21 3419  . docusate sodium (COLACE) capsule 100 mg  100 mg Oral BID Briant Cedar, MD   100 mg at 01/15/21 0827  . feeding supplement (ENSURE ENLIVE / ENSURE PLUS) liquid 237 mL  237 mL Oral TID BM Briant Cedar, MD   237 mL at 01/15/21 1248  . gabapentin (NEURONTIN) capsule 300 mg  300 mg Oral TID Briant Cedar, MD   300 mg at 01/15/21 1247  . OLANZapine zydis (ZYPREXA) disintegrating tablet 5 mg  5 mg Oral Q8H PRN  Harlow Asa, MD   5 mg at 01/14/21 2029   And  . LORazepam (ATIVAN) tablet 1 mg  1 mg Oral PRN Harlow Asa, MD       And  . ziprasidone (GEODON) injection 20 mg  20 mg Intramuscular PRN Nelda Marseille, Amy E, MD      . magnesium hydroxide (MILK OF MAGNESIA) suspension 30 mL  30 mL Oral Daily PRN Pashayan, Redgie Grayer, MD      . melatonin tablet 5 mg  5 mg Oral QHS PRN Nelda Marseille, Amy E, MD      . mirtazapine (REMERON) tablet 15 mg  15 mg Oral QHS Arthor Captain, MD   15 mg at 01/14/21 2130  . multivitamin with minerals tablet 1 tablet  1 tablet Oral Daily Briant Cedar, MD   1 tablet at 01/15/21 [id number]  . neomycin-bacitracin-polymyxin (NEOSPORIN) ointment   Topical BID Harlow Asa, MD   Given at 01/15/21 0825  . paliperidone (INVEGA) 24 hr tablet 6 mg  6 mg Oral QHS Viann Fish E, MD   6 mg at 01/14/21 2130  . polyethylene glycol (MIRALAX / GLYCOLAX) packet 17 g  17 g Oral Daily Briant Cedar, MD   17 g at 01/15/21 7897    Lab Results: No results found for this or any previous visit (from the past 48 hour(s)).  Blood Alcohol level:  Lab Results  Component Value Date   ETH <10 12/25/2020   ETH <10 84/78/4128    Metabolic Disorder Labs: Lab Results  Component Value Date   HGBA1C 5.2 01/09/2021   MPG 102.54 01/09/2021   MPG 117 06/28/2020   No results found for: PROLACTIN Lab Results  Component Value Date   CHOL 188 11/18/2020   TRIG 124 11/18/2020   HDL 69 11/18/2020   CHOLHDL 2.7 11/18/2020   VLDL 25 11/18/2020   LDLCALC 94 11/18/2020   LDLCALC 128 (H) 06/28/2020    Physical Findings: AIMS: Facial and Oral Movements Muscles of Facial Expression: None, normal Lips and Perioral Area: None, normal Jaw: None, normal Tongue: None, normal,Extremity Movements Upper (arms, wrists, hands, fingers): None, normal Lower (legs, knees, ankles, toes): None, normal, Trunk Movements Neck, shoulders, hips: None, normal, Overall Severity Severity of abnormal  movements (highest score from questions above): None, normal Incapacitation due to abnormal movements: None, normal Patient's awareness of abnormal movements (rate only patient's report): No Awareness, Dental Status Current problems with teeth and/or dentures?: No Does patient usually wear dentures?: No  CIWA:    COWS:     Psychiatric Specialty Exam:  Presentation  General Appearance: Appropriate for Environment; Well Groomed  Eye Contact:Fair  Speech:Clear and Coherent; Normal Rate  Speech Volume:Normal  Handedness:Right   Mood and Affect  Mood:Anxious; Depressed ("scared")  Affect:Appropriate; Congruent; Restricted   Thought Process  Thought Processes:Coherent; Goal Directed; Linear  Descriptions of Associations:Intact  Orientation:Full (Time, Place and Person)  Thought Content:Delusions; Rumination; Paranoid Ideation (rumination about "the future", paranoia "thinking that I'm under surveillance" and seeing "cars going by that I don't recognize; worried I'm under arrest" (clarified for marijuana use))  History of Schizophrenia/Schizoaffective disorder:Yes  Duration of Psychotic Symptoms:Greater than six months  Hallucinations:Hallucinations: Auditory Description of Auditory Hallucinations: voices coming "through tinnitus in my left ear" telling him not to eat, that he's going to hell. Patient described them as "intrusive thoughts", clarified that these are a/w a sound/voice  Ideas of Reference:None  Suicidal Thoughts:Suicidal Thoughts: Yes, Passive SI Passive Intent and/or Plan: Without Plan; Without Intent; Without Access to Means  Homicidal Thoughts:Homicidal Thoughts: No   Sensorium  Memory:Immediate Good; Recent Good; Remote Good  Judgment:Fair (adherent to prescribed medications but unclear if judgement for eating and self-care (e.g. moving around, leaving the bed/room) are intact)  Insight:Good   Executive Functions  Concentration:Fair  Attention  Span:Fair  [place]  Language:Good   Psychomotor Activity  Psychomotor Activity:Psychomotor Activity: Decreased   Assets  Assets:Communication Skills; Desire for Improvement; Other (comment); Social Support (insight)   Sleep  Sleep:Sleep: Fair (improving but still < 6h) Number of Hours of Sleep: 5    Physical Exam: Physical Exam Constitutional:      General: He is not in acute distress. HENT:     Head: Normocephalic.  Ears:     Comments: Right ear remnant without bandage today. No gross erythema, drainage. Patient lying on R ear without overt discomfort.  Pulmonary:     Effort: Pulmonary effort is normal.  Skin:    General: Skin is warm and dry.  Neurological:     General: No focal deficit present.     Mental Status: He is alert.    Review of Systems  HENT: Positive for tinnitus.        L ear tinnitus "not new, started in 2013". Dry mouth.   Gastrointestinal: Positive for constipation.  Neurological: Positive for dizziness.       Dizziness on standing in the morning.   Psychiatric/Behavioral: Positive for depression and hallucinations. The patient is nervous/anxious and has insomnia.   All other systems reviewed and are negative.  Blood pressure 120/75, pulse 94, temperature 98.4 F (36.9 C), temperature source Oral, resp. rate (!) 25, height 6' (1.829 m), weight 62.1 kg, SpO2 97 %. Body mass index is 18.58 kg/m.   Treatment Plan Summary: Daily contact with patient to assess and evaluate symptoms and progress in treatment and Medication management   Continue 1:1 observation status with visits to cafeteria for meals  Encouraged movement around unit, getting out of bed, attempting spending time in day room   Psychosis -Continue clozapine 74m every 12 hours to target psychotic symptoms and reduce suicidality.  Plan to increase dose again on 01/16/2021.  Labs from 01/11/2021 include WBC of 4.5 and ANC of 2800.  Baseline  troponin mildly elevated 19 which was discussed with cardiology and no concern for initiation of clozapine.  EKG performed 01/12/2021 revealed normal sinus rhythm with ventricular rate of 70 and QT/QTc of 404/436.  -Continue Thorazine 100 mg every morning and nightly.  Plan for further taper and discontinuation as clozapine dose is increased. -Decrease Invega to 343mqhs, discontinue Cogentin  -Weekly CBC with WBC and ANC and weekly troponins with next draw 01/18/2021.  Depression/anxiety -Discontinue Remeron 15 mg at bedtime for depression and anxiety  -Continue gabapentin 300 mg 3 times daily for anxiety -Anticipate trial of new antidepressant to begin this week pending tolerance of decreases in antipsychotic medications  Constipation -Increase docusate to 200 mg twice daily -Continue MOM as needed -Continue MiraLAX daily  EPS -Discontinue Cogentin due to decreased dose of Invega and lack of EPS symptoms endorsed by patient   Insomnia -Continue Melatonin 51m77mo PRN at bedtime   Wound Care -- dry dressing daily and will add neosporin to knee wounds  -- patient encouraged to attend to ADLs and shower - staff to assist -- Checking post-void bladder scan for signs of urinary retention  Disposition planning in progress.    Given severity of patient's illness and seriousness of recent life-threatening self-mutilation and suicide attempt, [facility]  [place] Marionedical Student MarEthelene Browns  I attest that I saw and interviewed the patient and that I performed or reperformed the mental status examination of the patient.  I reviewed the medical record.  I have formulated the plan of treatment.  I am in agreement with the exam, assessment and plan as documented above.  MarArthor CaptainD 01/15/2021, 6:20 PM

## 2021-01-15 NOTE — Progress Notes (Addendum)
1:1 Observation  Pt is alert and oriented x 4. Interacted appropiatly with staff. No signs of distress nor concerns reported at present. Safety maintained. 

## 2021-01-15 NOTE — Tx Team (Signed)
Interdisciplinary Treatment and Diagnostic Plan Update  01/15/2021 Time of Session: 9:50am Sean Shepherd MRN: [id number]  Principal Diagnosis: Schizoaffective disorder, bipolar type ([name])  Secondary Diagnoses: Principal Problem:   Schizoaffective disorder, bipolar type ([name]) Active Problems:   Insomnia   Anxiety disorder, unspecified   Current Medications:  Current Facility-Administered Medications  Medication Dose Route Frequency Provider Last Rate Last Admin  . acetaminophen (TYLENOL) tablet 650 mg  650 mg Oral Q6H PRN Briant Cedar, MD      . alum & mag hydroxide-simeth (MAALOX/MYLANTA) 200-200-20 MG/5ML suspension 30 mL  30 mL Oral Q4H PRN Briant Cedar, MD      . benztropine (COGENTIN) tablet 0.5 mg  0.5 mg Oral QHS Arthor Captain, MD   0.5 mg at 01/14/21 2130  . chlorproMAZINE (THORAZINE) tablet 100 mg  100 mg Oral QHS Nelda Marseille, Amy E, MD   100 mg at 01/14/21 2130   And  . chlorproMAZINE (THORAZINE) tablet 100 mg  100 mg Oral Daily Harlow Asa, MD   100 mg at 01/15/21 4580  . cloZAPine (CLOZARIL) tablet 25 mg  25 mg Oral Q12H Harlow Asa, MD   25 mg at 01/15/21 9983  . docusate sodium (COLACE) capsule 100 mg  100 mg Oral BID Briant Cedar, MD   100 mg at 01/15/21 0827  . feeding supplement (ENSURE ENLIVE / ENSURE PLUS) liquid 237 mL  237 mL Oral TID BM Briant Cedar, MD   237 mL at 01/14/21 2131  . gabapentin (NEURONTIN) capsule 300 mg  300 mg Oral TID Briant Cedar, MD   300 mg at 01/15/21 0825  . OLANZapine zydis (ZYPREXA) disintegrating tablet 5 mg  5 mg Oral Q8H PRN Harlow Asa, MD   5 mg at 01/14/21 2029   And  . LORazepam (ATIVAN) tablet 1 mg  1 mg Oral PRN Harlow Asa, MD       And  . ziprasidone (GEODON) injection 20 mg  20 mg Intramuscular PRN Nelda Marseille, Amy E, MD      . magnesium hydroxide (MILK OF MAGNESIA) suspension 30 mL  30 mL Oral Daily PRN Pashayan, Redgie Grayer, MD      . melatonin tablet 5 mg  5  mg Oral QHS PRN Nelda Marseille, Amy E, MD      . mirtazapine (REMERON) tablet 15 mg  15 mg Oral QHS Arthor Captain, MD   15 mg at 01/14/21 2130  . multivitamin with minerals tablet 1 tablet  1 tablet Oral Daily Briant Cedar, MD   1 tablet at 01/15/21 [phone]  . neomycin-bacitracin-polymyxin (NEOSPORIN) ointment   Topical BID Harlow Asa, MD   Given at 01/15/21 0825  . paliperidone (INVEGA) 24 hr tablet 6 mg  6 mg Oral QHS Viann Fish E, MD   6 mg at 01/14/21 2130  . polyethylene glycol (MIRALAX / GLYCOLAX) packet 17 g  17 g Oral Daily Briant Cedar, MD   17 g at 01/15/21 0539   PTA Medications: Medications Prior to Admission  Medication Sig Dispense Refill Last Dose  . acetaminophen (TYLENOL) 500 MG tablet Take 2 tablets (1,000 mg total) by mouth every 6 (six) hours as needed. 30 tablet 0   . benztropine (COGENTIN) 1 MG tablet Take 1 tablet (1 mg total) by mouth at bedtime.     . chlorproMAZINE (THORAZINE) 100 MG tablet Take 1 tablet (100 mg total) by mouth daily.     . chlorproMAZINE (THORAZINE) 200  MG tablet Take 1 tablet (200 mg total) by mouth at bedtime.     . docusate sodium (COLACE) 100 MG capsule Take 1 capsule (100 mg total) by mouth 2 (two) times daily. 10 capsule 0   . gabapentin (NEURONTIN) 300 MG capsule Take 1 capsule (300 mg total) by mouth 3 (three) times daily.     . methocarbamol (ROBAXIN) 500 MG tablet Take 2 tablets (1,000 mg total) by mouth every 8 (eight) hours as needed for muscle spasms.     . mirtazapine (REMERON) 30 MG tablet Take 1 tablet (30 mg total) by mouth at bedtime.     . Multiple Vitamin (MULTIVITAMIN WITH MINERALS) TABS tablet Take 1 tablet by mouth daily.     . Oxycodone HCl 10 MG TABS Take 0.5-1 tablets (5-10 mg total) by mouth every 4 (four) hours as needed. 15 tablet 0   . paliperidone (INVEGA) 9 MG 24 hr tablet Take 1 tablet (9 mg total) by mouth at bedtime.       Patient Stressors: Health problems Legal issue Traumatic  event  Patient Strengths: Ability for insight Capable of independent living Communication skills Motivation for treatment/growth Supportive family/friends  Treatment Modalities: Medication Management, Group therapy, Case management,  1 to 1 session with clinician, Psychoeducation, Recreational therapy.   Physician Treatment Plan for Primary Diagnosis: Schizoaffective disorder, bipolar type ([place]) Long Term Goal(s): Improvement in symptoms so as ready for discharge Improvement in symptoms so as ready for discharge   Short Term Goals: Ability to identify changes in lifestyle to reduce recurrence of condition will improve Ability to verbalize feelings will improve Ability to disclose and discuss suicidal ideas Ability to demonstrate self-control will improve Ability to identify and develop effective coping behaviors will improve Compliance with prescribed medications will improve Ability to identify triggers associated with substance abuse/mental health issues will improve Ability to identify changes in lifestyle to reduce recurrence of condition will improve Ability to verbalize feelings will improve Ability to disclose and discuss suicidal ideas Ability to demonstrate self-control will improve Ability to identify and develop effective coping behaviors will improve Compliance with prescribed medications will improve Ability to identify triggers associated with substance abuse/mental health issues will improve  Medication Management: Evaluate patient's response, side effects, and tolerance of medication regimen.  Therapeutic Interventions: 1 to 1 sessions, Unit Group sessions and Medication administration.  Evaluation of Outcomes: Not Met  Physician Treatment Plan for Secondary Diagnosis: Principal Problem:   Schizoaffective disorder, bipolar type ([place]) Active Problems:   Insomnia   Anxiety disorder, unspecified  Long Term Goal(s): Improvement in symptoms so as ready for  discharge Improvement in symptoms so as ready for discharge   Short Term Goals: Ability to identify changes in lifestyle to reduce recurrence of condition will improve Ability to verbalize feelings will improve Ability to disclose and discuss suicidal ideas Ability to demonstrate self-control will improve Ability to identify and develop effective coping behaviors will improve Compliance with prescribed medications will improve Ability to identify triggers associated with substance abuse/mental health issues will improve Ability to identify changes in lifestyle to reduce recurrence of condition will improve Ability to verbalize feelings will improve Ability to disclose and discuss suicidal ideas Ability to demonstrate self-control will improve Ability to identify and develop effective coping behaviors will improve Compliance with prescribed medications will improve Ability to identify triggers associated with substance abuse/mental health issues will improve     Medication Management: Evaluate patient's response, side effects, and tolerance of medication regimen.  Therapeutic  Interventions: 1 to 1 sessions, Unit Group sessions and Medication administration.  Evaluation of Outcomes: Not Met   RN Treatment Plan for Primary Diagnosis: Schizoaffective disorder, bipolar type ([place]) Long Term Goal(s): Knowledge of disease and therapeutic regimen to maintain health will improve  Short Term Goals: Ability to remain free from injury will improve, Ability to verbalize frustration and anger appropriately will improve, Ability to demonstrate self-control, Ability to identify and develop effective coping behaviors will improve and Compliance with prescribed medications will improve  Medication Management: RN will administer medications as ordered by provider, will assess and evaluate patient's response and provide education to patient for prescribed medication. RN will report any adverse and/or side  effects to prescribing provider.  Therapeutic Interventions: 1 on 1 counseling sessions, Psychoeducation, Medication administration, Evaluate responses to treatment, Monitor vital signs and CBGs as ordered, Perform/monitor CIWA, COWS, AIMS and Fall Risk screenings as ordered, Perform wound care treatments as ordered.  Evaluation of Outcomes: Not Met   LCSW Treatment Plan for Primary Diagnosis: Schizoaffective disorder, bipolar type ([place]) Long Term Goal(s): Safe transition to appropriate next level of care at discharge, Engage patient in therapeutic group addressing interpersonal concerns.  Short Term Goals: Engage patient in aftercare planning with referrals and resources, Increase social support, Increase ability to appropriately verbalize feelings, Identify triggers associated with mental health/substance abuse issues and Increase skills for wellness and recovery  Therapeutic Interventions: Assess for all discharge needs, 1 to 1 time with Social worker, Explore available resources and support systems, Assess for adequacy in community support network, Educate family and significant other(s) on suicide prevention, Complete Psychosocial Assessment, Interpersonal group therapy.  Evaluation of Outcomes: Not Met   Progress in Treatment: Attending groups: No. Participating in groups: No. Taking medication as prescribed: Yes. Toleration medication: Yes. Family/Significant other contact made: Yes, individual(s) contacted:  mother Patient understands diagnosis: Yes. Discussing patient identified problems/goals with staff: Yes. Medical problems stabilized or resolved: Yes. Denies suicidal/homicidal ideation: Yes. Issues/concerns per patient self-inventory: No.    New problem(s) identified: No, Describe:  none  New Short Term/Long Term Goal(s): medication stabilization, elimination of SI thoughts, development of comprehensive mental wellness plan.   Patient Goals:  Did not attend  Discharge  Plan or Barriers: CSW will continue to follow and assess for appropriate referrals and possible discharge planning.     Reason for Continuation of Hospitalization: Delusions  Depression Hallucinations Medical Issues Medication stabilization  Estimated Length of Stay: 3-5 days  Attendees: Patient:  01/15/2021 10:40 AM  Physician:  01/15/2021 10:40 AM  Nursing:  01/15/2021 10:40 AM  RN Care Manager: 01/15/2021 10:40 AM  Social Worker: Darletta Moll, LCSW 01/15/2021 10:40 AM  Recreational Therapist:  01/15/2021 10:40 AM  Other:  01/15/2021 10:40 AM  Other:  01/15/2021 10:40 AM  Other: 01/15/2021 10:40 AM    Scribe for Treatment Team: Vassie Moselle, LCSW 01/15/2021 10:40 AM

## 2021-01-16 MED ORDER — CLOZAPINE 25 MG PO TABS
25.0000 mg | ORAL_TABLET | Freq: Every day | ORAL | Status: DC
  Administered 2021-01-16 – 2021-01-22 (×7): 25 mg via ORAL
  Filled 2021-01-16 (×9): qty 1

## 2021-01-16 MED ORDER — CLOZAPINE 25 MG PO TABS: 37.5000 mg | ORAL_TABLET | Freq: Two times a day (BID) | ORAL | Status: DC

## 2021-01-16 MED ORDER — CHLORPROMAZINE HCL 25 MG PO TABS
75.0000 mg | ORAL_TABLET | Freq: Every day | ORAL | Status: DC
  Administered 2021-01-16: 75 mg via ORAL
  Filled 2021-01-16 (×2): qty 3

## 2021-01-16 MED ORDER — CHLORPROMAZINE HCL 25 MG PO TABS
75.0000 mg | ORAL_TABLET | Freq: Every day | ORAL | Status: DC
  Administered 2021-01-16: 75 mg via ORAL
  Filled 2021-01-16 (×3): qty 3

## 2021-01-16 MED ORDER — CLOZAPINE 25 MG PO TABS
50.0000 mg | ORAL_TABLET | Freq: Every day | ORAL | Status: DC
  Administered 2021-01-16 – 2021-01-17 (×2): 50 mg via ORAL
  Filled 2021-01-16 (×3): qty 2

## 2021-01-16 NOTE — Progress Notes (Signed)
Pt did not attend group. 

## 2021-01-16 NOTE — Progress Notes (Signed)
Nursing 1:1 note D:Pt observed laying in bed. Pt continually moving his legs and shifting positions. RR even and unlabored. No distress noted. A: 1:1 observation continues for safety  R: Pt remains safe

## 2021-01-16 NOTE — Progress Notes (Signed)
1:1 Nursing Note  Pt continues on a 1:1 for safety due to high fall risk and extensive self-mutilation wounds. Pt does endorse AH tonight of sounds and beeping noises. He denies any command hallucinations. He also shares that he has been having racing thoughts which consists of flashbacks of his suicide attempt. Pt has been resting in bed and didn't attend group. He did ambulate to use the bathroom. The dressings to his midline abdominal wound and right ear were also changed. A small amount of light brown drainage was noted and the wounds are starting to crust over. Sitter remains at his bedtime at all times. Pt denies SI/HI and VH. He verbally contracts for safety. Active listening, reassurance, and support provided. Medications administered as ordered by provider. Q 15 min safety checks continue. Pt's safety has been maintained.

## 2021-01-16 NOTE — Progress Notes (Signed)
Nursing 1:1 note D:Pt observed laying in bed with eyes closed. RR even and unlabored. No distress noted. A: 1:1 observation continues for safety  R: Pt remains safe  

## 2021-01-16 NOTE — Progress Notes (Signed)
Pharmacy:  Clozapine  Pt registered with [facility]   Pts [facility]S ID is [id number]  Requires weekly CBC with dif.   Peggye Fothergill, [place] D

## 2021-01-16 NOTE — BHH Counselor (Signed)
CSW sent a fax to [facility] that included progress notes from 01/13/2021 to 01/16/2021.  CSW also called to confirm that Mr. Sean Shepherd continues to be on the waiting list for a hospital bed at [facility]. [phone]).

## 2021-01-16 NOTE — Progress Notes (Signed)
1:1 Note:   Pt is observed laying in bed with eyes open. Pt was given a.m. meds without issue. RN changed abdominal wound dressing and applied ordered neosporin. Pt was flat/anxious on approach; denies auditory hallucinations at this time. Pt denies SI/HI/VH. Sitter within arms reach. Pt remains safe.

## 2021-01-16 NOTE — Progress Notes (Signed)
   01/15/21 2100  Psych Admission Type (Psych Patients Only)  Admission Status Voluntary  Psychosocial Assessment  Patient Complaints Anxiety;Depression;Worrying  Eye Contact Brief  Facial Expression Anxious;Pensive  Affect Anxious;Preoccupied  Speech Logical/coherent  Interaction Minimal  Motor Activity Slow  Appearance/Hygiene Unremarkable  Behavior Characteristics Cooperative;Anxious  Mood Depressed;Anxious  Thought Process  Coherency WDL  Content WDL  Delusions None reported or observed  Perception Hallucinations  Hallucination Auditory  Judgment Poor  Confusion None  Danger to Self  Current suicidal ideation? Denies  Danger to Others  Danger to Others None reported or observed

## 2021-01-16 NOTE — Progress Notes (Signed)
1:1 Note:   Pt observed laying in bed; Pt has been isolative to room provided with encouragement. Pt refused lunch; ensures provided. Sitter within arms reach. Pt remains safe.

## 2021-01-16 NOTE — Progress Notes (Addendum)
[facility] MD Progress Note  01/16/2021 4:19 PM Sean Shepherd  MRN:  [id number]   Reason for admission: Sean Shepherd a 45 year old male with prior diagnoses of schizoaffective disorder versus schizophrenia, depression and past marijuana use who was transferred from [facility] medical unit after prolonged hospitalization for recovery from multiple significant self-inflicted injuries in response to command auditory hallucinations in a suicide attempt.   Objective: Medical record reviewed. Case discussed in detail with members of the treatment team.  MD and medical student met with and evaluated the patient in his room for follow-up today. Amro was able to attend 2 group sessions yesterday; he states that he was anxious and nervous during the groups but it was manageable. He did not have a bowel movement yesterday and continues to take bowel regimen as prescribed. Counseled that getting out of bed and moving around as well as drinking lots of fluids can help with constipation. His appetite remains low but he is able to eat some or most of his meals each time. Lightheadedness on standing remains the same. Sleep was "better" (4 hours charted), and he noticed decreased intensity of nightmares last night. Encouraged him to try to stay awake during the day so he can sleep well at night, to which he states "I've been in such a sleep deficit over the past few months, I feel like I have to sleep as much as I can".   [name] endorses some "flashbacks" to the episode of self-mutilation--thoughts, images, "almost like I'm reliving it". He sometimes gets a sudden onset of cold sweats and trembling when these flashbacks occur. Encouraged patient to refocus on present with sensory awareness techniques when these occur. He endorses thoughts of not wanting to be alive or wishing he wouldn't wake up, but no active SI/HI. AH voice messages are the same today (eg, saying "you're a terrible person, you're going  to hell, don't eat."), slightly decreased frequency. He also endorses getting messages from the [facility] turning on/off and from birds chirping. He says "my mind is constantly racing, so many thoughts" and the messages from these entities are like "yes or no" in response to his thoughts (e.g. that a thought is right or wrong). He says this has been occurring since he's been here and is frequent throughout the day, perhaps slightly decreased in the last 24hrs.  Encouraged [name] to continue trying to attend group sessions, challenge himself to watch TV in the day room to prevent reinforcing avoidant behavior, and moving around throughout the day.   I made direct phone contact with patient's mother [name] [phone]) today at patient's request and we spoke for approximately 40 minutes.  Updated mother regarding the patient's current status, symptoms, treatment, medications, plans for future treatment, etc.  Mother was provided with opportunity to ask questions.  Mother's questions were answered.  Patient's mother expressed understanding of information discussed and stated appreciation for the call.  Principal Problem: Schizoaffective disorder, bipolar type ([place]) Diagnosis: Principal Problem:   Schizoaffective disorder, bipolar type ([place]) Active Problems:   Insomnia   Anxiety disorder, unspecified  Total Time spent with patient: 30 minutes  Past Psychiatric History: See admission H&P  Past Medical History:  Past Medical History:  Diagnosis Date  . Anxiety disorder, unspecified 01/10/2021  . Bipolar disorder ([place])   . Depression   . Schizoaffective disorder [facility])     Past Surgical History:  Procedure Laterality Date  . LAPAROTOMY N/A 12/25/2020   Procedure: EXPLORATORY LAPAROTOMY; REPAIR OF GROIN LACERATIONS X2,  RIGHT THIGH LACERATION X 2; REPAIR RIGHT NECK AND LEFT NECK LACERATION AND REPAIR OF LEFT WRIST LACERATION;  Surgeon: Georganna Skeans, MD;  Location: [place];  Service: General;   Laterality: N/A;  . NO PAST SURGERIES     Family History: History reviewed. No pertinent family history. Family Psychiatric  History: See admission H&P Social History:  Social History   Substance and Sexual Activity  Alcohol Use Not Currently     Social History   Substance and Sexual Activity  Drug Use Yes  . Types: Marijuana    Social History   Socioeconomic History  . Marital status: Single    Spouse name: Not on file  . Number of children: 0  . Years of education: Not on file  . Highest education level: Not on file  Occupational History  . Not on file  Tobacco Use  . Smoking status: Former Smoker    Quit date: 07/31/2014    Years since quitting: 6.4  . Smokeless tobacco: Never Used  Vaping Use  . Vaping Use: Every day  . Substances: THC, CBD  Substance and Sexual Activity  . Alcohol use: Not Currently  . Drug use: Yes    Types: Marijuana  . Sexual activity: Never  Other Topics Concern  . Not on file  Social History Narrative  . Not on file   Social Determinants of Health   Financial Resource Strain: Not on file  Food Insecurity: Not on file  Transportation Needs: Not on file  Physical Activity: Not on file  Stress: Not on file  Social Connections: Not on file   Additional Social History:                         Sleep: Fair  Appetite:  Poor  Current Medications: Current Facility-Administered Medications  Medication Dose Route Frequency Provider Last Rate Last Admin  . acetaminophen (TYLENOL) tablet 650 mg  650 mg Oral Q6H PRN Briant Cedar, MD      . alum & mag hydroxide-simeth (MAALOX/MYLANTA) 200-200-20 MG/5ML suspension 30 mL  30 mL Oral Q4H PRN Briant Cedar, MD      . chlorproMAZINE (THORAZINE) tablet 75 mg  75 mg Oral Daily Arthor Captain, MD   75 mg at 01/16/21 9983   And  . chlorproMAZINE (THORAZINE) tablet 75 mg  75 mg Oral QHS Arthor Captain, MD      . cloZAPine (CLOZARIL) tablet 25 mg  25 mg Oral Daily  Arthor Captain, MD   25 mg at 01/16/21 0844  . cloZAPine (CLOZARIL) tablet 50 mg  50 mg Oral QHS Arthor Captain, MD      . docusate sodium (COLACE) capsule 100 mg  100 mg Oral Once Arthor Captain, MD      . docusate sodium (COLACE) capsule 200 mg  200 mg Oral BID Arthor Captain, MD   200 mg at 01/16/21 0845  . feeding supplement (ENSURE ENLIVE / ENSURE PLUS) liquid 237 mL  237 mL Oral TID BM Briant Cedar, MD   237 mL at 01/16/21 1417  . gabapentin (NEURONTIN) capsule 300 mg  300 mg Oral TID Briant Cedar, MD   300 mg at 01/16/21 1122  . OLANZapine zydis (ZYPREXA) disintegrating tablet 5 mg  5 mg Oral Q8H PRN Harlow Asa, MD   5 mg at 01/14/21 2029   And  . LORazepam (ATIVAN) tablet 1 mg  1 mg Oral  PRN Harlow Asa, MD       And  . ziprasidone (GEODON) injection 20 mg  20 mg Intramuscular PRN Nelda Marseille, Amy E, MD      . magnesium hydroxide (MILK OF MAGNESIA) suspension 30 mL  30 mL Oral Daily PRN Pashayan, Redgie Grayer, MD      . melatonin tablet 5 mg  5 mg Oral QHS PRN Harlow Asa, MD      . multivitamin with minerals tablet 1 tablet  1 tablet Oral Daily Briant Cedar, MD   1 tablet at 01/16/21 0845  . neomycin-bacitracin-polymyxin (NEOSPORIN) ointment   Topical BID Harlow Asa, MD   Given at 01/16/21 1028  . polyethylene glycol (MIRALAX / GLYCOLAX) packet 17 g  17 g Oral Daily Briant Cedar, MD   17 g at 01/16/21 0845    Lab Results: No results found for this or any previous visit (from the past 40 hour(s)).  Blood Alcohol level:  Lab Results  Component Value Date   ETH <10 12/25/2020   ETH <10 64/33/2951    Metabolic Disorder Labs: Lab Results  Component Value Date   HGBA1C 5.2 01/09/2021   MPG 102.54 01/09/2021   MPG 117 06/28/2020   No results found for: PROLACTIN Lab Results  Component Value Date   CHOL 188 11/18/2020   TRIG 124 11/18/2020   HDL 69 11/18/2020   CHOLHDL 2.7 11/18/2020   VLDL 25 11/18/2020   LDLCALC  94 11/18/2020   LDLCALC 128 (H) 06/28/2020    Physical Findings: AIMS: Facial and Oral Movements Muscles of Facial Expression: None, normal Lips and Perioral Area: None, normal Jaw: None, normal Tongue: None, normal,Extremity Movements Upper (arms, wrists, hands, fingers): None, normal Lower (legs, knees, ankles, toes): None, normal, Trunk Movements Neck, shoulders, hips: None, normal, Overall Severity Severity of abnormal movements (highest score from questions above): None, normal Incapacitation due to abnormal movements: None, normal Patient's awareness of abnormal movements (rate only patient's report): No Awareness, Dental Status Current problems with teeth and/or dentures?: No Does patient usually wear dentures?: No  CIWA:    COWS:     Psychiatric Specialty Exam:  Presentation  General Appearance: Appropriate for Environment; Fairly Groomed; Other (comment) (Bandage over right ear)  Eye Contact:Fair  Speech:Clear and Coherent; Normal Rate  Speech Volume:Normal  Handedness:Right   Mood and Affect  Mood:Anxious; Depressed; Hopeless  Affect:Congruent; Depressed; Constricted   Thought Process  Thought Processes:Coherent; Linear  Descriptions of Associations:Intact  Orientation:Full (Time, Place and Person)  Thought Content:Rumination; Other (comment) (Intrusive thoughts and images of past trauma; ruminative worry about the future)  History of Schizophrenia/Schizoaffective disorder:Yes  Duration of Psychotic Symptoms:Greater than six months  Hallucinations:Hallucinations: Auditory Description of Auditory Hallucinations: Telling patient you 're a terrible person, you're going to hell, telling him not to eat  Ideas of Reference:Other (comment) (getting messages from A/C unit in room and birds chirping outside that are "yes/no" responses to his thoughts; e.g. this thought is right vs. wrong)  Suicidal Thoughts:Suicidal Thoughts: Yes, Passive SI Passive Intent  and/or Plan: Without Intent; Without Plan  Homicidal Thoughts:Homicidal Thoughts: No   Sensorium  Memory:Immediate Good; Recent Good; Remote Good  Judgment:Fair  Insight:Fair   Executive Functions  Concentration:Good  Attention Span:Good  [place]  Language:Good   Psychomotor Activity  Psychomotor Activity:Psychomotor Activity: Decreased   Assets  Assets:Desire for Improvement; Resilience; Social Support; Communication Skills   Sleep  Sleep:Sleep: Fair Number of Hours of Sleep: 4  Physical Exam: Physical Exam Constitutional:      Appearance: Normal appearance.  HENT:     Head: Normocephalic.  Pulmonary:     Effort: Pulmonary effort is normal.  Musculoskeletal:     Cervical back: Normal range of motion.  Neurological:     Mental Status: He is alert and oriented to person, place, and time.    Review of Systems  HENT: Positive for tinnitus.        L ear  Neurological: Positive for dizziness.       Dizziness on standing  Psychiatric/Behavioral: Positive for depression and hallucinations. The patient is nervous/anxious.   All other systems reviewed and are negative.  Blood pressure 120/75, pulse 94, temperature 98.4 F (36.9 C), temperature source Oral, resp. rate (!) 25, height 6' (1.829 m), weight 62.1 kg, SpO2 97 %. Body mass index is 18.58 kg/m. Vitals from 01/14/21.  Treatment Plan Summary: Daily contact with patient to assess and evaluate symptoms and progress in treatment and Medication management  Continue1:1 observation status with visits to cafeteria for meals  Encouraged participation in group therapy and therapeutic milieu  Encouraged movement around unit, getting out of bed, attempting spending time in day room   Psychosis -Increase clozapine to 65m AM/575mPM dose today. Plan to load PM dose more than AM dose to improve sleep.  Anticipate further upward titration as indicated/tolerated. -Decrease  Thorazine to 7571mO every morning and 75 mg  PO nightly.  Plan for further taper and discontinuation as clozapine dose is increased. -Discontinue Invega -Weekly CBC with WBC and ANCand weekly troponinswith next draw 01/18/2021.  Depression/anxiety -Continue gabapentin 300 mg 3 times daily for anxiety -Anticipate trial of new antidepressant to begin this week pending tolerance of decreases in antipsychotic medications  Constipation -Increase docusate to 200 mg twice daily -Continue MOM as needed -Continue MiraLAX daily -Consider addition of another agent if constipation persists -Push p.o. fluids -Ambulation and out of bed encouraged  Insomnia -ContinueMelatonin 5mg45m PRN at bedtime   Wound Care -- dry dressing daily and will add neosporin to knee wounds  -- patient encouraged to attend to ADLs and shower - staff to assist -- Checking post-void bladder scan for signs of urinary retention  Disposition planning in progress.   Given severity of patient's illness and seriousness of recent life-threatening self-mutilation and suicide attempt, [facility].  HeatClydene Pughdical Student MartEthelene Browns  I attest that I saw and interviewed the patient and that I performed or reperformed the mental status examination of the patient.  I reviewed the medical record.  I have formulated the plan of treatment.  I am in agreement with the exam, assessment and plan as documented above.  MartArthor Captain 01/16/2021, 4:19 PM

## 2021-01-16 NOTE — Progress Notes (Signed)
1:1 Note:   Pt observed laying in bed; Pt has been isolative to room provided with encouragement. Pt was encourage to go to dinner this evening. Sitter within arms reach. Pt remains safe.

## 2021-01-17 LAB — CBC WITH DIFFERENTIAL/PLATELET
Abs Immature Granulocytes: 0.01 10*3/uL (ref 0.00–0.07)
Basophils Absolute: 0 10*3/uL (ref 0.0–0.1)
Basophils Relative: 1 %
Eosinophils Absolute: 0.2 10*3/uL (ref 0.0–0.5)
Eosinophils Relative: 6 %
HCT: 35.4 % — ABNORMAL LOW (ref 39.0–52.0)
Hemoglobin: 11.3 g/dL — ABNORMAL LOW (ref 13.0–17.0)
Immature Granulocytes: 0 %
Lymphocytes Relative: 28 %
Lymphs Abs: 1.2 10*3/uL (ref 0.7–4.0)
MCH: 29.6 pg (ref 26.0–34.0)
MCHC: 31.9 g/dL (ref 30.0–36.0)
MCV: 92.7 fL (ref 80.0–100.0)
Monocytes Absolute: 0.4 10*3/uL (ref 0.1–1.0)
Monocytes Relative: 10 %
Neutro Abs: 2.3 10*3/uL (ref 1.7–7.7)
Neutrophils Relative %: 55 %
Platelets: 321 10*3/uL (ref 150–400)
RBC: 3.82 MIL/uL — ABNORMAL LOW (ref 4.22–5.81)
RDW: 13.4 % (ref 11.5–15.5)
WBC: 4.2 10*3/uL (ref 4.0–10.5)
nRBC: 0 % (ref 0.0–0.2)

## 2021-01-17 MED ORDER — CHLORPROMAZINE HCL 50 MG PO TABS
50.0000 mg | ORAL_TABLET | Freq: Every day | ORAL | Status: DC
  Administered 2021-01-17: 50 mg via ORAL
  Filled 2021-01-17 (×3): qty 1

## 2021-01-17 MED ORDER — CHLORPROMAZINE HCL 25 MG PO TABS
75.0000 mg | ORAL_TABLET | Freq: Every day | ORAL | Status: DC
  Administered 2021-01-17: 75 mg via ORAL
  Filled 2021-01-17 (×2): qty 3

## 2021-01-17 NOTE — Progress Notes (Signed)
Pt in room lying down in bed.  Eyes closed.  Pt appears to be in no acute distress.  1:1 sitter in the room.  No immediate concerns were identified.

## 2021-01-17 NOTE — Progress Notes (Signed)
1:1 Nursing Note  Pt is currently resting in bed. Pt appears to be awake, moving his legs around a little in bed. His respirations are even and unlabored. No distress has been observed. Q 15 min safety checks continue. Pt's safety has been maintained.

## 2021-01-17 NOTE — Progress Notes (Addendum)
Pt declined dressing changes on pt's abdomen and ear.  Pt said, "my bandages are fine, I'm not scratching at them, I don't want them changed right now."    This RN will pass information along to uncoming night RN.

## 2021-01-17 NOTE — Progress Notes (Addendum)
1:1 Nursing Note  Pt remains resting in bed. He is occasionally moving around in bed or mumbling to himself. His respirations are even and unlabored. No distress has been observed. Q 15 min safety checks continue. Pt's safety has been maintained.

## 2021-01-17 NOTE — Progress Notes (Signed)
Pt declined to go outside today with the other patients, but he did stay back and interact with a smaller group of pt's who were in the dayroom.  RN encouraged pt to walk, get out of the room, and interact with others.  Pt went down to lunch and dinner.  Pt declined dressing changes this afternoon.  Pt took medications without incident.

## 2021-01-17 NOTE — Progress Notes (Signed)
[facility] MD Progress Note  01/17/2021 5:08 PM Sean Shepherd  MRN:  [id number]  Reason for admission: Sean Reiseris a 45 year old male with prior diagnoses of schizoaffective disorder versus schizophrenia, depression and past marijuana use who was transferred from [facility] medical unit after prolonged hospitalization for recovery from multiple significant self-inflicted injuries in response to command auditory hallucinations in a suicide attempt.  Objective: Medical record reviewed. Case discussed in detail with members of the treatment team.MD and medical studentmet with and evaluated the patient in his room for follow-up today. Sean Shepherd was sleeping comfortably prior to interview.  Patient reports improved sleep last night in comparison to previous nights with decreased nightmares. His mood today is anxious but better, rated 2-3/10. He did not attend groups yesterday but spent ~1hr in the day room, said this made him anxious because there was a violent movie on the TV in the room which is bothersome to him. Ate breakfast in room, went to cafeteria at lunch time and was too anxious to eat with everyone else in there, returned ~3pm and ate lunch then. AH are less frequent but his mind is still racing and he's getting "answers" from random noises (examples given by patient include car horn, AC on/off, shoe squeaking) to questions/thoughts in his head. He also experiences his mind forming vulgar/nonsensical words if there are noises/music/voices that are loud enough for him to hear but too quiet to make out the words of. He declines to describe these experiences further but is displeased by them. Sean Shepherd states he talked to his mom and sister yesterday and this went well. He did have a BM this morning.  Patient was reminded that he has PRN stool softeners that he can ask for if BMs do not continue and become regular. MD further clarified what IVC status and being on the wait list for [facility]  means after hearing possible concerns expressed by mother yesterday.  Strongly encouraged Sean Shepherd to get out of his room today, go to cafeteria for all meals, spend time in day room and in group sessions even if these experiences are uncomfortable and anxiety-provoking for him.  Discussed that avoidance of anxiety provoking situations only heightens his anxiety about encountering them; further limits his quality of life; results in him isolating himself and ruminating on worries and negative thoughts; and, is counterproductive.  Discussed with patient that confronting anxiety provoking situations rather than avoiding them is an equally important part of his treatment as taking his medications. Sean Shepherd seemed to understand this and said he will be more active today.  The patient slept 5.5 hours last night.  Labs from today reviewed.  CMP today with WBC of 4.2, RBC of 3.82 (up from 3.44 last draw) hemoglobin of 11.3 (up from 10.1 last draw) hematocrit of 35.4 (up from 31.9 last draw) and otherwise WNL.  Differential is WNL with ANC of 2300.  Vitals this morning are 97.59F, pulse 94, respirations of 20, sitting BP of 107/82 and standing 78/53, consistent with mild orthostatic hypotension.  This is consistent with [name]'s experience of lightheadedness on standing. Encouraged PO fluid intake and counseled that moving around more rather than lying down all day will also help with lightheadedness.    Principal Problem: Schizoaffective disorder, bipolar type ([place]) Diagnosis: Principal Problem:   Schizoaffective disorder, bipolar type ([place]) Active Problems:   Insomnia   Anxiety disorder, unspecified  Total Time spent with patient: 30 minutes  Past Psychiatric History: see admission H&P  Past Medical History:  Past  Medical History:  Diagnosis Date  . Anxiety disorder, unspecified 01/10/2021  . Bipolar disorder ([place])   . Depression   . Schizoaffective disorder [facility])     Past Surgical History:   Procedure Laterality Date  . LAPAROTOMY N/A 12/25/2020   Procedure: EXPLORATORY LAPAROTOMY; REPAIR OF GROIN LACERATIONS X2, RIGHT THIGH LACERATION X 2; REPAIR RIGHT NECK AND LEFT NECK LACERATION AND REPAIR OF LEFT WRIST LACERATION;  Surgeon: Georganna Skeans, MD;  Location: [facility];  Service: General;  Laterality: N/A;  . NO PAST SURGERIES     Family History: History reviewed. No pertinent family history. Family Psychiatric  History: see admission H&P Social History:  Social History   Substance and Sexual Activity  Alcohol Use Not Currently     Social History   Substance and Sexual Activity  Drug Use Yes  . Types: Marijuana    Social History   Socioeconomic History  . Marital status: Single    Spouse name: Not on file  . Number of children: 0  . Years of education: Not on file  . Highest education level: Not on file  Occupational History  . Not on file  Tobacco Use  . Smoking status: Former Smoker    Quit date: 07/31/2014    Years since quitting: 6.4  . Smokeless tobacco: Never Used  Vaping Use  . Vaping Use: Every day  . Substances: THC, CBD  Substance and Sexual Activity  . Alcohol use: Not Currently  . Drug use: Yes    Types: Marijuana  . Sexual activity: Never  Other Topics Concern  . Not on file  Social History Narrative  . Not on file   Social Determinants of Health   Financial Resource Strain: Not on file  Food Insecurity: Not on file  Transportation Needs: Not on file  Physical Activity: Not on file  Stress: Not on file  Social Connections: Not on file   Additional Social History:                         Sleep: Fair  Appetite:  Fair  Current Medications: Current Facility-Administered Medications  Medication Dose Route Frequency Provider Last Rate Last Admin  . acetaminophen (TYLENOL) tablet 650 mg  650 mg Oral Q6H PRN Briant Cedar, MD      . alum & mag hydroxide-simeth (MAALOX/MYLANTA) 200-200-20 MG/5ML suspension 30 mL  30  mL Oral Q4H PRN Briant Cedar, MD      . chlorproMAZINE (THORAZINE) tablet 50 mg  50 mg Oral Daily Arthor Captain, MD   50 mg at 01/17/21 4944   And  . chlorproMAZINE (THORAZINE) tablet 75 mg  75 mg Oral QHS Arthor Captain, MD      . cloZAPine (CLOZARIL) tablet 25 mg  25 mg Oral Daily Arthor Captain, MD   25 mg at 01/17/21 9675  . cloZAPine (CLOZARIL) tablet 50 mg  50 mg Oral QHS Arthor Captain, MD   50 mg at 01/16/21 2131  . docusate sodium (COLACE) capsule 200 mg  200 mg Oral BID Arthor Captain, MD   200 mg at 01/17/21 1700  . feeding supplement (ENSURE ENLIVE / ENSURE PLUS) liquid 237 mL  237 mL Oral TID BM Briant Cedar, MD   237 mL at 01/17/21 1513  . gabapentin (NEURONTIN) capsule 300 mg  300 mg Oral TID Briant Cedar, MD   300 mg at 01/17/21 1700  . OLANZapine zydis (ZYPREXA) disintegrating  tablet 5 mg  5 mg Oral Q8H PRN Harlow Asa, MD   5 mg at 01/14/21 2029   And  . LORazepam (ATIVAN) tablet 1 mg  1 mg Oral PRN Harlow Asa, MD       And  . ziprasidone (GEODON) injection 20 mg  20 mg Intramuscular PRN Nelda Marseille, Amy E, MD      . magnesium hydroxide (MILK OF MAGNESIA) suspension 30 mL  30 mL Oral Daily PRN Pashayan, Redgie Grayer, MD      . melatonin tablet 5 mg  5 mg Oral QHS PRN Harlow Asa, MD      . multivitamin with minerals tablet 1 tablet  1 tablet Oral Daily Briant Cedar, MD   1 tablet at 01/17/21 [phone]  . neomycin-bacitracin-polymyxin (NEOSPORIN) ointment   Topical BID Harlow Asa, MD   Given at 01/17/21 1701  . polyethylene glycol (MIRALAX / GLYCOLAX) packet 17 g  17 g Oral Daily Briant Cedar, MD   17 g at 01/17/21 0825    Lab Results:  Results for orders placed or performed during the hospital encounter of 01/09/21 (from the past 48 hour(s))  CBC with Differential/Platelet     Status: Abnormal   Collection Time: 01/17/21  6:26 AM  Result Value Ref Range   WBC 4.2 4.0 - 10.5 K/uL   RBC 3.82 (L) 4.22 - 5.81  MIL/uL   Hemoglobin 11.3 (L) 13.0 - 17.0 g/dL   HCT 35.4 (L) 39.0 - 52.0 %   MCV 92.7 80.0 - 100.0 fL   MCH 29.6 26.0 - 34.0 pg   MCHC 31.9 30.0 - 36.0 g/dL   RDW 13.4 11.5 - 15.5 %   Platelets 321 150 - 400 K/uL   nRBC 0.0 0.0 - 0.2 %   Neutrophils Relative % 55 %   Neutro Abs 2.3 1.7 - 7.7 K/uL   Lymphocytes Relative 28 %   Lymphs Abs 1.2 0.7 - 4.0 K/uL   Monocytes Relative 10 %   Monocytes Absolute 0.4 0.1 - 1.0 K/uL   Eosinophils Relative 6 %   Eosinophils Absolute 0.2 0.0 - 0.5 K/uL   Basophils Relative 1 %   Basophils Absolute 0.0 0.0 - 0.1 K/uL   Immature Granulocytes 0 %   Abs Immature Granulocytes 0.01 0.00 - 0.07 K/uL    Comment: Performed at [facility], [place] [address] [place], [place] [address]    Blood Alcohol level:  Lab Results  Component Value Date   ETH <10 12/25/2020   ETH <10 14/48/1856    Metabolic Disorder Labs: Lab Results  Component Value Date   HGBA1C 5.2 01/09/2021   MPG 102.54 01/09/2021   MPG 117 06/28/2020   No results found for: PROLACTIN Lab Results  Component Value Date   CHOL 188 11/18/2020   TRIG 124 11/18/2020   HDL 69 11/18/2020   CHOLHDL 2.7 11/18/2020   VLDL 25 11/18/2020   LDLCALC 94 11/18/2020   LDLCALC 128 (H) 06/28/2020    Physical Findings: AIMS: Facial and Oral Movements Muscles of Facial Expression: None, normal Lips and Perioral Area: None, normal Jaw: None, normal Tongue: None, normal,Extremity Movements Upper (arms, wrists, hands, fingers): None, normal Lower (legs, knees, ankles, toes): None, normal, Trunk Movements Neck, shoulders, hips: None, normal, Overall Severity Severity of abnormal movements (highest score from questions above): None, normal Incapacitation due to abnormal movements: None, normal Patient's awareness of abnormal movements (rate only patient's report): No Awareness, Dental Status  Current problems with teeth and/or dentures?: No Does patient usually wear dentures?:  No  CIWA:    COWS:      Psychiatric Specialty Exam:  Presentation  General Appearance: Appropriate for Environment; Fairly Groomed (lying in bed on arrival to room and throughout interview)  Eye Contact:Good  Speech:Clear and Coherent; Normal Rate  Speech Volume:Normal  Handedness:Right   Mood and Affect  Mood:Anxious (2-3/10)  Affect:Appropriate; Congruent   Thought Process  Thought Processes:Coherent; Goal Directed; Linear  Descriptions of Associations:Intact  Orientation:Full (Time, Place and Person)  Thought Content:Perseveration; Rumination  History of Schizophrenia/Schizoaffective disorder:Yes  Duration of Psychotic Symptoms:Greater than six months  Hallucinations:Hallucinations: Other (comment) (possible tactile hallucinations vs. realistic dreams where cups disappear as he drinks water. states the voices are gone today except for his mind creating vulgar words/narrations from voices/noises that are to far away to make out actual words) Description of Auditory Hallucinations: Telling patient you 're a terrible person, you're going to hell, telling him not to eat  Ideas of Reference:-- (getting "answers" to his thoughts from random noises--car horn, AC clicking on/off, shoe squeaking on floor)  Suicidal Thoughts:Suicidal Thoughts: Yes, Passive SI Passive Intent and/or Plan: Without Intent; Without Plan  Homicidal Thoughts:Homicidal Thoughts: No   Sensorium  Memory:Immediate Good; Recent Good; Remote Good  Judgment:Fair (wants to get better and very amenable to medications, but less amenable so far to non-medical parts of treatment like getting out of his room and moving around throughout the day)  Insight:Good   Executive Functions  Concentration:Good  Attention Span:Good  [place] of Knowledge:Good  Language:Good   Psychomotor Activity  Psychomotor Activity:Psychomotor Activity: Normal   Assets  Assets:Desire for Improvement;  Armed forces logistics/support/administrative officer; Social Support   Sleep  Sleep:Sleep: Fair Number of Hours of Sleep: 5.5    Physical Exam: Physical Exam Vitals and nursing note reviewed.  Constitutional:      Appearance: Normal appearance.  HENT:     Head: Normocephalic.     Ears:     Comments: Bandage over R ear Pulmonary:     Effort: Pulmonary effort is normal.  Neurological:     Mental Status: He is alert and oriented to person, place, and time.    Review of Systems  Gastrointestinal: Positive for constipation.       BM this morning after many days  Neurological: Positive for dizziness.       Orthostatic  Psychiatric/Behavioral: Positive for depression and hallucinations. The patient is nervous/anxious.   All other systems reviewed and are negative.  Blood pressure 99/67, pulse 91, temperature (!) 97.4 F (36.3 C), temperature source Oral, resp. rate 20, height 6' (1.829 m), weight 62.1 kg, SpO2 99 %. Body mass index is 18.58 kg/m.   Treatment Plan Summary: Daily contact with patient to assess and evaluate symptoms and progress in treatment and Medication management  IVC paperwork and first QPE completed today by this Probation officer.  Continue1:1 observation statuswith visits to cafeteria for meals  Encouraged participation in group therapy and therapeutic milieu  Encouragedmovement around unit, getting out of bed, attempting spending time in day room. Stated that next step is to lock him out of his room to encourage this movement because it is imperative to his improvement.   Psychosis -Continue clozapine 18m AM/587mPM dose today with plan to increase tomorrow. Plan to load PM dose more than AM dose to improve sleep.  Anticipate further upward titration as indicated/tolerated. CBC and ANC WNL this morning.  -Decrease Thorazine to 502m  PO every morning and 56m PO nightly. Plan for further taper and discontinuation as clozapine dose is increased. -Weekly CBC with WBC and ANCand weekly  troponinswith next draw 01/24/21.  Depression/anxiety -Continue gabapentin 300 mg 3 times daily for anxiety -Anticipate trial of new antidepressant to beginthis week pending tolerance of decreases in antipsychotic medications  Constipation -Continuedocusate to 2048mtwice daily -Continue MOM as needed -Continue MiraLAX daily -Consider addition of another agent if constipation persists -Push p.o. fluids -Ambulation and out of bed encouraged  Insomnia -ContinueMelatonin 64m70moPRN at bedtime  Wound Care -- dry dressing daily and will add neosporin to knee wounds  -- patient encouraged to attend to ADLs and shower - staff to assist -- Checking post-void bladder scan for signs of urinary retention  Disposition planning in progress.   Given severity of patient's illness and seriousness of recent life-threatening self-mutilation and suicide attempt, [facility].  HeaMaxwell Marionedical Student MarEthelene BrownsD  I attest that I saw and interviewed the patient and that I performed or reperformed the mental status examination of the patient. I reviewed the medical record. I have formulated the plan of treatment. I am in agreement with the exam, assessment and plan as documented above.  MarArthor CaptainD 01/17/2021, 5:08 PM

## 2021-01-17 NOTE — Progress Notes (Signed)
Pt is in dayroom watching television. Pt interacting some with other pt's.  Pt appears to be in no acute distress.  RN will continue to monitor.

## 2021-01-17 NOTE — Progress Notes (Signed)
Recreation Therapy Notes  Date: 5.18.22 Time: 0950 Location: [address] Dayroom  Group Topic: Stress Management  Goal Area(s) Addresses:  Patient will identify positive stress management techniques. Patient will identify benefits of using stress management post d/c.  Intervention: Stress Management  Activity: Meditation.  LRT played a meditation that focused on taking on the characteristics of a mountain.  The meditation focused on being unmovable in the face of changes/obstacles that occur in life.     Education:  Stress Management, Discharge Planning.   Education Outcome: Acknowledges Education  Clinical Observations/Feedback: Pt did not attend group session.    Caroll Rancher, LRT/CTRS        Caroll Rancher A 01/17/2021 10:54 AM

## 2021-01-17 NOTE — Progress Notes (Signed)
Psychoeducational Group Note  Date:  01/17/2021 Time:  2015 Group Topic/Focus:  wrap up group  Participation Level: Did Not Attend  Participation Quality:  Not Applicable  Affect:  Not Applicable  Cognitive:  Not Applicable  Insight:  Not Applicable  Engagement in Group: Not Applicable  Additional Comments:  Did not attend.   Oryn Casanova S 01/17/2021, 10:48 PM  

## 2021-01-17 NOTE — B[facility] Group Notes (Signed)
Type of Therapy and Topic:  Group Therapy:  Setting Goals   Participation Level:  Active   Description of Group: In this process group, patients discussed using strengths to work toward goals and address challenges.  Patients identified two positive things about themselves and one goal they were working on.  Patients were given the opportunity to share openly and support each other's plan for self-empowerment.  The group discussed the value of gratitude and were encouraged to have a daily reflection of positive characteristics or circumstances.  Patients were encouraged to identify a plan to utilize their strengths to work on current challenges and goals.   Therapeutic Goals 1. Patient will verbalize personal strengths/positive qualities and relate how these can assist with achieving desired personal goals 2. Patients will verbalize affirmation of peers plans for personal change and goal setting 3. Patients will explore the value of gratitude and positive focus as related to successful achievement of goals 4. Patients will verbalize a plan for regular reinforcement of personal positive qualities and circumstances.   Summary of Patient Progress: Patient identified the definition of goals. Patients was given the opportunity to share openly and support other group members' plan for self-empowerment. Patient verbalized personal strength and how they relate to achieving the desired goal. Patient was able to identify positive goals to work towards when she returns home.  

## 2021-01-18 LAB — CBC WITH DIFFERENTIAL/PLATELET
Abs Immature Granulocytes: 0.01 10*3/uL (ref 0.00–0.07)
Basophils Absolute: 0 10*3/uL (ref 0.0–0.1)
Basophils Relative: 1 %
Eosinophils Absolute: 0.2 10*3/uL (ref 0.0–0.5)
Eosinophils Relative: 5 %
HCT: 36.1 % — ABNORMAL LOW (ref 39.0–52.0)
Hemoglobin: 11.6 g/dL — ABNORMAL LOW (ref 13.0–17.0)
Immature Granulocytes: 0 %
Lymphocytes Relative: 28 %
Lymphs Abs: 1.3 10*3/uL (ref 0.7–4.0)
MCH: 29.6 pg (ref 26.0–34.0)
MCHC: 32.1 g/dL (ref 30.0–36.0)
MCV: 92.1 fL (ref 80.0–100.0)
Monocytes Absolute: 0.5 10*3/uL (ref 0.1–1.0)
Monocytes Relative: 11 %
Neutro Abs: 2.6 10*3/uL (ref 1.7–7.7)
Neutrophils Relative %: 55 %
Platelets: 314 10*3/uL (ref 150–400)
RBC: 3.92 MIL/uL — ABNORMAL LOW (ref 4.22–5.81)
RDW: 13.3 % (ref 11.5–15.5)
WBC: 4.7 10*3/uL (ref 4.0–10.5)
nRBC: 0 % (ref 0.0–0.2)

## 2021-01-18 LAB — TROPONIN I (HIGH SENSITIVITY): Troponin I (High Sensitivity): 14 ng/L (ref <18)

## 2021-01-18 MED ORDER — CHLORPROMAZINE HCL 50 MG PO TABS
50.0000 mg | ORAL_TABLET | Freq: Every day | ORAL | Status: DC
  Administered 2021-01-18 – 2021-01-20 (×3): 50 mg via ORAL
  Filled 2021-01-18 (×5): qty 1

## 2021-01-18 MED ORDER — CHLORPROMAZINE HCL 50 MG PO TABS
50.0000 mg | ORAL_TABLET | Freq: Every day | ORAL | Status: DC
  Filled 2021-01-18: qty 1

## 2021-01-18 MED ORDER — CLOZAPINE 25 MG PO TABS
75.0000 mg | ORAL_TABLET | Freq: Every day | ORAL | Status: DC
  Administered 2021-01-18: 75 mg via ORAL
  Filled 2021-01-18 (×2): qty 3

## 2021-01-18 NOTE — Progress Notes (Signed)
RN cleaned pt's abdominal area and applied dressing to abdomen.  RN applied neosporin to pt's knees and put bandaids on knees.  Pt's dressing on R ear fell off.  Pt declines dressing put on R ear.

## 2021-01-18 NOTE — BHH Counselor (Signed)
CSW spoke with [facility] staff and was informed that Sean Shepherd continues to be on the waitlist for a bed at this facility.

## 2021-01-18 NOTE — Progress Notes (Addendum)
[facility] MD Progress Note  01/18/2021 2:16 PM Sean Shepherd  MRN:  [id number]   Reason for admission: Sean Reiseris a 45 year old male with prior diagnosesof schizoaffectivedisorder versus schizophrenia, depression and past marijuana use who was transferred from [facility] medical unit after prolonged hospitalization for recovery from multiple significant self-inflicted injuries in response to command auditory hallucinations in a suicide attempt.  Objective: Medical record reviewed.  Case discussed in detail with members of the treatment team.  I have met with and evaluated the patient for follow-up today in the office on the unit.  Patient was asleep in his room but was willing to ambulate with me to the office on the unit and did so without difficulty.  He maintains good eye contact and is alert during the interview.  He is cooperative and polite.  Speech is spontaneous and somewhat hyperverbal.  Mood is anxious.  Patient expresses ongoing worry that he may imminently be arrested by the police.  He feels that he might be under surveillance in the hospital by law enforcement.  Patient states he could be in trouble with the law because of his marijuana use even though he has not used since June 2021.  He thinks that law enforcement may arrest him or may arrest his parents.  Patient states he has had this concern since March but perhaps is thinking about it a bit more now.  Prior to today he was more focused on the nature of his recent self-induced injuries.  Patient reports some dizziness and lightheadedness this morning upon standing which has now improved.  He denies other physical problems.  Patient states that he had a bowel movement this morning.  He reports that his mood is anxious.  He continues to have passive wishes not to be alive but denies any thoughts of harming himself or active suicidal ideation.  He continues to have intermittent auditory hallucinations but denies dangerous  command hallucinations.  Patient states that he has been attending groups, spending more time in the day room and has been going to meals.  Appetite has been fair.  He feels he slept better last night but no hours of sleep were recorded by nursing staff.  Labs this morning include CBC with WBC of 4.7, RBC of 3.92, hemoglobin of 11.6, hematocrit of 36.1 and otherwise WNL.  Differential this morning was WNL with absolute neutrophil count of 2600.  Troponin this morning was WNL at 14.  Vital signs this morning include BP of 115/78 sitting and 70/55 standing pulse of 86 sitting and 96 standing, O2 sat of 98% and temperature of 97.7.  Staff report that patient has been more present in the day room and has been social with small group of peers.  He has been going to the dining hall for lunch and dinner.  Principal Problem: Schizoaffective disorder, bipolar type ([place]) Diagnosis: Principal Problem:   Schizoaffective disorder, bipolar type ([place]) Active Problems:   Insomnia   Anxiety disorder, unspecified  Total Time spent with patient: 25 minutes  Past Psychiatric History: See admission H&P  Past Medical History:  Past Medical History:  Diagnosis Date  . Anxiety disorder, unspecified 01/10/2021  . Bipolar disorder ([place])   . Depression   . Schizoaffective disorder [facility])     Past Surgical History:  Procedure Laterality Date  . LAPAROTOMY N/A 12/25/2020   Procedure: EXPLORATORY LAPAROTOMY; REPAIR OF GROIN LACERATIONS X2, RIGHT THIGH LACERATION X 2; REPAIR RIGHT NECK AND LEFT NECK LACERATION AND REPAIR OF LEFT WRIST  LACERATION;  Surgeon: Georganna Skeans, MD;  Location: [facility];  Service: General;  Laterality: N/A;  . NO PAST SURGERIES     Family History: History reviewed. No pertinent family history. Family Psychiatric  History: See admission H&P Social History:  Social History   Substance and Sexual Activity  Alcohol Use Not Currently     Social History   Substance and Sexual Activity  Drug Use  Yes  . Types: Marijuana    Social History   Socioeconomic History  . Marital status: Single    Spouse name: Not on file  . Number of children: 0  . Years of education: Not on file  . Highest education level: Not on file  Occupational History  . Not on file  Tobacco Use  . Smoking status: Former Smoker    Quit date: 07/31/2014    Years since quitting: 6.4  . Smokeless tobacco: Never Used  Vaping Use  . Vaping Use: Every day  . Substances: THC, CBD  Substance and Sexual Activity  . Alcohol use: Not Currently  . Drug use: Yes    Types: Marijuana  . Sexual activity: Never  Other Topics Concern  . Not on file  Social History Narrative  . Not on file   Social Determinants of Health   Financial Resource Strain: Not on file  Food Insecurity: Not on file  Transportation Needs: Not on file  Physical Activity: Not on file  Stress: Not on file  Social Connections: Not on file   Additional Social History:                         Sleep: Fair  Appetite:  Fair  Current Medications: Current Facility-Administered Medications  Medication Dose Route Frequency Provider Last Rate Last Admin  . acetaminophen (TYLENOL) tablet 650 mg  650 mg Oral Q6H PRN Briant Cedar, MD      . alum & mag hydroxide-simeth (MAALOX/MYLANTA) 200-200-20 MG/5ML suspension 30 mL  30 mL Oral Q4H PRN Briant Cedar, MD      . chlorproMAZINE (THORAZINE) tablet 50 mg  50 mg Oral QHS Arthor Captain, MD      . cloZAPine (CLOZARIL) tablet 25 mg  25 mg Oral Daily Arthor Captain, MD   25 mg at 01/18/21 0815  . cloZAPine (CLOZARIL) tablet 75 mg  75 mg Oral QHS Arthor Captain, MD      . docusate sodium (COLACE) capsule 200 mg  200 mg Oral BID Arthor Captain, MD   200 mg at 01/18/21 0815  . feeding supplement (ENSURE ENLIVE / ENSURE PLUS) liquid 237 mL  237 mL Oral TID BM Briant Cedar, MD   237 mL at 01/18/21 1020  . gabapentin (NEURONTIN) capsule 300 mg  300 mg Oral TID Briant Cedar, MD   300 mg at 01/18/21 1326  . OLANZapine zydis (ZYPREXA) disintegrating tablet 5 mg  5 mg Oral Q8H PRN Harlow Asa, MD   5 mg at 01/14/21 2029   And  . LORazepam (ATIVAN) tablet 1 mg  1 mg Oral PRN Nelda Marseille, Amy E, MD       And  . ziprasidone (GEODON) injection 20 mg  20 mg Intramuscular PRN Nelda Marseille, Amy E, MD      . magnesium hydroxide (MILK OF MAGNESIA) suspension 30 mL  30 mL Oral Daily PRN Briant Cedar, MD      . melatonin tablet 5 mg  5  mg Oral QHS PRN Harlow Asa, MD      . multivitamin with minerals tablet 1 tablet  1 tablet Oral Daily Briant Cedar, MD   1 tablet at 01/18/21 0816  . neomycin-bacitracin-polymyxin (NEOSPORIN) ointment   Topical BID Harlow Asa, MD   Given at 01/18/21 [phone]  . polyethylene glycol (MIRALAX / GLYCOLAX) packet 17 g  17 g Oral Daily Briant Cedar, MD   17 g at 01/17/21 0825    Lab Results:  Results for orders placed or performed during the hospital encounter of 01/09/21 (from the past 48 hour(s))  CBC with Differential/Platelet     Status: Abnormal   Collection Time: 01/17/21  6:26 AM  Result Value Ref Range   WBC 4.2 4.0 - 10.5 K/uL   RBC 3.82 (L) 4.22 - 5.81 MIL/uL   Hemoglobin 11.3 (L) 13.0 - 17.0 g/dL   HCT 35.4 (L) 39.0 - 52.0 %   MCV 92.7 80.0 - 100.0 fL   MCH 29.6 26.0 - 34.0 pg   MCHC 31.9 30.0 - 36.0 g/dL   RDW 13.4 11.5 - 15.5 %   Platelets 321 150 - 400 K/uL   nRBC 0.0 0.0 - 0.2 %   Neutrophils Relative % 55 %   Neutro Abs 2.3 1.7 - 7.7 K/uL   Lymphocytes Relative 28 %   Lymphs Abs 1.2 0.7 - 4.0 K/uL   Monocytes Relative 10 %   Monocytes Absolute 0.4 0.1 - 1.0 K/uL   Eosinophils Relative 6 %   Eosinophils Absolute 0.2 0.0 - 0.5 K/uL   Basophils Relative 1 %   Basophils Absolute 0.0 0.0 - 0.1 K/uL   Immature Granulocytes 0 %   Abs Immature Granulocytes 0.01 0.00 - 0.07 K/uL    Comment: Performed at [facility], [place] [address] [place], [place] [address]   CBC with Differential/Platelet     Status: Abnormal   Collection Time: 01/18/21  6:45 AM  Result Value Ref Range   WBC 4.7 4.0 - 10.5 K/uL   RBC 3.92 (L) 4.22 - 5.81 MIL/uL   Hemoglobin 11.6 (L) 13.0 - 17.0 g/dL   HCT 36.1 (L) 39.0 - 52.0 %   MCV 92.1 80.0 - 100.0 fL   MCH 29.6 26.0 - 34.0 pg   MCHC 32.1 30.0 - 36.0 g/dL   RDW 13.3 11.5 - 15.5 %   Platelets 314 150 - 400 K/uL   nRBC 0.0 0.0 - 0.2 %   Neutrophils Relative % 55 %   Neutro Abs 2.6 1.7 - 7.7 K/uL   Lymphocytes Relative 28 %   Lymphs Abs 1.3 0.7 - 4.0 K/uL   Monocytes Relative 11 %   Monocytes Absolute 0.5 0.1 - 1.0 K/uL   Eosinophils Relative 5 %   Eosinophils Absolute 0.2 0.0 - 0.5 K/uL   Basophils Relative 1 %   Basophils Absolute 0.0 0.0 - 0.1 K/uL   Immature Granulocytes 0 %   Abs Immature Granulocytes 0.01 0.00 - 0.07 K/uL    Comment: Performed at [facility], [place] [address], [place], [place] [address]  Troponin I (High Sensitivity)     Status: None   Collection Time: 01/18/21  6:45 AM  Result Value Ref Range   Troponin I (High Sensitivity) 14 <18 ng/L    Comment: (NOTE) Elevated high sensitivity troponin I (hsTnI) values and significant  changes across serial measurements may suggest ACS but many other  chronic and acute conditions are known  to elevate hsTnI results.  Refer to the "Links" section for chest pain algorithms and additional  guidance. Performed at [facility], [place] [address], [place], [place] [address]     Blood Alcohol level:  Lab Results  Component Value Date   ETH <10 12/25/2020   ETH <10 48/18/5631    Metabolic Disorder Labs: Lab Results  Component Value Date   HGBA1C 5.2 01/09/2021   MPG 102.54 01/09/2021   MPG 117 06/28/2020   No results found for: PROLACTIN Lab Results  Component Value Date   CHOL 188 11/18/2020   TRIG 124 11/18/2020   HDL 69 11/18/2020   CHOLHDL 2.7 11/18/2020   VLDL 25 11/18/2020   LDLCALC 94 11/18/2020    LDLCALC 128 (H) 06/28/2020    Physical Findings: AIMS: Facial and Oral Movements Muscles of Facial Expression: None, normal Lips and Perioral Area: None, normal Jaw: None, normal Tongue: None, normal,Extremity Movements Upper (arms, wrists, hands, fingers): None, normal Lower (legs, knees, ankles, toes): None, normal, Trunk Movements Neck, shoulders, hips: None, normal, Overall Severity Severity of abnormal movements (highest score from questions above): None, normal Incapacitation due to abnormal movements: None, normal Patient's awareness of abnormal movements (rate only patient's report): No Awareness, Dental Status Current problems with teeth and/or dentures?: No Does patient usually wear dentures?: No  CIWA:    COWS:     Musculoskeletal: Strength & Muscle Tone: within normal limits Gait & Station: normal Patient leans: N/A  Psychiatric Specialty Exam:  Presentation  General Appearance: Appropriate for Environment; Fairly Groomed  Eye Contact:Good  Speech:Clear and Coherent; Normal Rate (Hyperverbal)  Speech Volume:Normal  Handedness:Right   Mood and Affect  Mood:Anxious; Dysphoric  Affect:Congruent   Thought Process  Thought Processes:Coherent; Goal Directed  Descriptions of Associations:Intact  Orientation:Full (Time, Place and Person)  Thought Content:Perseveration; Rumination; Paranoid Ideation (Paranoid concerns that he may be arrested or that his parents may be arrested due to patient's past marijuana use; intrusive thoughts of past traumatic events)  History of Schizophrenia/Schizoaffective disorder:Yes  Duration of Psychotic Symptoms:Greater than six months  Hallucinations:Hallucinations: Auditory  Ideas of Reference:Paranoia (States concerned that he may be under surveillance by law enforcement while in the hospital)  Suicidal Thoughts:Suicidal Thoughts: Yes, Passive SI Passive Intent and/or Plan: Without Intent; Without Plan  Homicidal  Thoughts:Homicidal Thoughts: No   Sensorium  Memory:Immediate Good; Recent Good; Remote Good  Judgment:Fair  Insight:Fair   Executive Functions  Concentration:Good  Attention Span:Good  Recall:Good  Fund of Knowledge:Good  Language:Good   Psychomotor Activity  Psychomotor Activity:Psychomotor Activity: Normal   Assets  Assets:Communication Skills; Desire for Improvement; Resilience; Social Support   Sleep  Sleep:Sleep: Fair Number of Hours of Sleep: 5.5    Physical Exam: Physical Exam Vitals and nursing note reviewed.  HENT:     Head: Normocephalic.  Pulmonary:     Effort: Pulmonary effort is normal.  Neurological:     General: No focal deficit present.     Mental Status: He is alert and oriented to person, place, and time.    ROS Blood pressure (!) 70/55, pulse 96, temperature 97.7 F (36.5 C), temperature source Oral, resp. rate 20, height 6' (1.829 m), weight 62.1 kg, SpO2 98 %. Body mass index is 18.58 kg/m.   Treatment Plan Summary: Daily contact with patient to assess and evaluate symptoms and progress in treatment and Medication management   Continue IVC status  Have discontinued one-to-one observation as patient has appeared steady with ambulation.  Will resume every  15-minute observation status.  Encouragedparticipation in group therapy and therapeutic milieu  Encouragedmovement around unit, getting out of bed, attempting spending time in day room. Stated that next step is to lock him out of his room to encourage this movement because it is imperative to his improvement.   Psychosis -Increase clozapine to 42m AM/73mPM dosetoday.  Patient appears to be tolerating clozapine well without significant side effects other than some intermittent mild orthostasis which may also be secondary to Thorazine as well as poor p.o. intake. Anticipate further upward titration as indicated/tolerated. CBC and ANC WNL this morning.   -DecreaseThorazine to 5083mnightly.  Morning dose has been discontinued. Plan for further taper and discontinuation as clozapine dose is increased. -Weekly CBC with WBC and ANCand weekly troponinswith next draw 01/24/21. -Weekly EKG performed today 01/18/2021 revealed normal sinus rhythm, ventricular rate of 89 and QT/QTc of 366/445..  Marland Kitchenepression/anxiety -Continue gabapentin 300 mg 3 times daily for anxiety -Anticipate initiation of trial of sertraline this week to target anxiety and depression symptoms  Constipation -Continuedocusate 200m64mice daily -Continue MOM as needed -Continue MiraLAX daily -Consider addition of another agent if constipation persists -Push p.o. fluids -Ambulation and out of bed encouraged  Insomnia -ContinueMelatonin 5mg 87mRN at bedtime  Wound Care -- dry dressing daily and will add neosporin to knee wounds  -- patient encouraged to attend to ADLs and shower - staff to assist -- Checking post-void bladder scan for signs of urinary retention  Disposition planning in progress.   Given severity of patient's illness and seriousness of recent life-threatening self-mutilation and suicide attempt, [facility]   Sean Captain5/19/2022, 2:16 PM

## 2021-01-18 NOTE — Progress Notes (Addendum)
   01/18/21 2000  Psych Admission Type (Psych Patients Only)  Admission Status Voluntary  Psychosocial Assessment  Patient Complaints Anxiety;Depression  Eye Contact Brief;Avoids  Facial Expression Anxious;Pensive;Sad  Affect Anxious;Appropriate to circumstance;Sad;Preoccupied  Speech Logical/coherent  Interaction Forwards little  Motor Activity Slow  Appearance/Hygiene Unremarkable  Behavior Characteristics Cooperative;Anxious  Thought Process  Coherency WDL  Content Blaming self;Preoccupation (preoccupied with the future)  Delusions None reported or observed  Perception Hallucinations  Hallucination Auditory;Command (knows not to listen to what the voices tell him to do to himself)  Judgment Poor  Confusion None  Danger to Self  Current suicidal ideation? Denies  Danger to Others  Danger to Others None reported or observed   Pt seen in his room. Pt denies SI, HI. Pt endorses AH saying that the voices are command telling him to hurt himself. "I know not to do that." Says the voices are quieter than the day before. Pt rates anxiety and depression both 8/10 d/t his thoughts about what he has done to himself and what his future will be. "I wake up sometimes and think things are normal and then I remember what I've done." Pt encouraged to view things as a "new normal." Pt counts his parents as his support and says they are the only family who know the reason why he is in the hospital. Says he has had some pain in his abdominal incision 3/10 but refuses any pain medication.

## 2021-01-18 NOTE — Progress Notes (Signed)
   01/18/21 0500  Psych Admission Type (Psych Patients Only)  Admission Status Voluntary  Psychosocial Assessment  Patient Complaints None  Eye Contact Brief;Avoids  Facial Expression Anxious;Flat  Affect Anxious;Appropriate to circumstance;Sad;Preoccupied  Speech Logical/coherent  Interaction Guarded;Minimal  Motor Activity Slow  Appearance/Hygiene Unremarkable  Behavior Characteristics Cooperative;Appropriate to situation  Thought Process  Coherency WDL  Content WDL  Delusions None reported or observed  Perception Hallucinations  Hallucination Auditory  Judgment Poor  Confusion None  Danger to Self  Current suicidal ideation? Denies  Danger to Others  Danger to Others None reported or observed  Danger to Others Abnormal  Harmful Behavior to others No threats or harm toward other people  Destructive Behavior No threats or harm toward property

## 2021-01-18 NOTE — Progress Notes (Signed)
Pt continues to endorse passive SI, but states he has no plan.  Pt also said "I hear the voices still, they say awful things to me."  Pt would not disclose what the voices say to him.  Pt said he would let staff know if he has any intention of hurting himself and contracts for safety on the unit.  RN provided support and assessed for needs and concerns.  Pt is working hard to spend some time out of his room and interact with other pt's in the milieu.  Pt remains safe on the unit with q 15 min safety check in place.

## 2021-01-18 NOTE — Progress Notes (Signed)
MD discontinued 1:1 status.

## 2021-01-19 MED ORDER — BACITRACIN-NEOMYCIN-POLYMYXIN OINTMENT TUBE
TOPICAL_OINTMENT | Freq: Three times a day (TID) | CUTANEOUS | Status: DC
  Administered 2021-01-21 – 2021-01-24 (×4): 1 via TOPICAL
  Filled 2021-01-19: qty 14.17

## 2021-01-19 MED ORDER — MUPIROCIN CALCIUM 2 % EX CREA
TOPICAL_CREAM | Freq: Three times a day (TID) | CUTANEOUS | Status: DC
  Administered 2021-01-21 – 2021-02-02 (×14): 1 via TOPICAL
  Filled 2021-01-19 (×3): qty 15

## 2021-01-19 MED ORDER — GABAPENTIN 400 MG PO CAPS
400.0000 mg | ORAL_CAPSULE | Freq: Every day | ORAL | Status: DC
  Administered 2021-01-19 – 2021-01-25 (×7): 400 mg via ORAL
  Filled 2021-01-19 (×10): qty 1

## 2021-01-19 MED ORDER — CLOZAPINE 100 MG PO TABS
100.0000 mg | ORAL_TABLET | Freq: Every day | ORAL | Status: DC
  Administered 2021-01-19 – 2021-01-20 (×2): 100 mg via ORAL
  Filled 2021-01-19 (×5): qty 1

## 2021-01-19 MED ORDER — MELATONIN 5 MG PO TABS
5.0000 mg | ORAL_TABLET | Freq: Every day | ORAL | Status: DC
  Administered 2021-01-19 – 2021-01-23 (×5): 5 mg via ORAL
  Filled 2021-01-19 (×6): qty 1

## 2021-01-19 MED ORDER — MELATONIN 5 MG PO TABS
5.0000 mg | ORAL_TABLET | Freq: Every evening | ORAL | Status: DC | PRN
  Administered 2021-01-23: 5 mg via ORAL
  Filled 2021-01-19: qty 1

## 2021-01-19 NOTE — Tx Team (Signed)
Interdisciplinary Treatment and Diagnostic Plan Update  01/19/2021 Time of Session: 9:50am Sean Shepherd MRN: [id number]  Principal Diagnosis: Schizoaffective disorder, bipolar type (HCC)  Secondary Diagnoses: Principal Problem:   Schizoaffective disorder, bipolar type (HCC) Active Problems:   Insomnia   Anxiety disorder, unspecified   Current Medications:  Current Facility-Administered Medications  Medication Dose Route Frequency Provider Last Rate Last Admin  . acetaminophen (TYLENOL) tablet 650 mg  650 mg Oral Q6H PRN Lauro Franklin, MD      . alum & mag hydroxide-simeth (MAALOX/MYLANTA) 200-200-20 MG/5ML suspension 30 mL  30 mL Oral Q4H PRN Lauro Franklin, MD      . chlorproMAZINE (THORAZINE) tablet 50 mg  50 mg Oral QHS Claudie Revering, MD   50 mg at 01/18/21 2110  . cloZAPine (CLOZARIL) tablet 100 mg  100 mg Oral QHS Claudie Revering, MD      . cloZAPine (CLOZARIL) tablet 25 mg  25 mg Oral Daily Claudie Revering, MD   25 mg at 01/19/21 0740  . docusate sodium (COLACE) capsule 200 mg  200 mg Oral BID Claudie Revering, MD   200 mg at 01/19/21 0741  . feeding supplement (ENSURE ENLIVE / ENSURE PLUS) liquid 237 mL  237 mL Oral TID BM Lauro Franklin, MD   237 mL at 01/18/21 2111  . gabapentin (NEURONTIN) capsule 300 mg  300 mg Oral TID Lauro Franklin, MD   300 mg at 01/19/21 0740  . OLANZapine zydis (ZYPREXA) disintegrating tablet 5 mg  5 mg Oral Q8H PRN Comer Locket, MD   5 mg at 01/19/21 0106   And  . LORazepam (ATIVAN) tablet 1 mg  1 mg Oral PRN Mason Jim, Amy E, MD       And  . ziprasidone (GEODON) injection 20 mg  20 mg Intramuscular PRN Mason Jim, Amy E, MD      . magnesium hydroxide (MILK OF MAGNESIA) suspension 30 mL  30 mL Oral Daily PRN Lauro Franklin, MD      . melatonin tablet 5 mg  5 mg Oral QHS Claudie Revering, MD      . multivitamin with minerals tablet 1 tablet  1 tablet Oral Daily Lauro Franklin, MD   1 tablet at 01/19/21  0741  . neomycin-bacitracin-polymyxin (NEOSPORIN) ointment   Topical BID Comer Locket, MD   Given at 01/19/21 0740  . polyethylene glycol (MIRALAX / GLYCOLAX) packet 17 g  17 g Oral Daily Lauro Franklin, MD   17 g at 01/17/21 0825   PTA Medications: Medications Prior to Admission  Medication Sig Dispense Refill Last Dose  . acetaminophen (TYLENOL) 500 MG tablet Take 2 tablets (1,000 mg total) by mouth every 6 (six) hours as needed. 30 tablet 0   . benztropine (COGENTIN) 1 MG tablet Take 1 tablet (1 mg total) by mouth at bedtime.     . chlorproMAZINE (THORAZINE) 100 MG tablet Take 1 tablet (100 mg total) by mouth daily.     . chlorproMAZINE (THORAZINE) 200 MG tablet Take 1 tablet (200 mg total) by mouth at bedtime.     . docusate sodium (COLACE) 100 MG capsule Take 1 capsule (100 mg total) by mouth 2 (two) times daily. 10 capsule 0   . gabapentin (NEURONTIN) 300 MG capsule Take 1 capsule (300 mg total) by mouth 3 (three) times daily.     . methocarbamol (ROBAXIN) 500 MG tablet Take 2 tablets (1,000 mg total) by mouth every 8 (  eight) hours as needed for muscle spasms.     . mirtazapine (REMERON) 30 MG tablet Take 1 tablet (30 mg total) by mouth at bedtime.     . Multiple Vitamin (MULTIVITAMIN WITH MINERALS) TABS tablet Take 1 tablet by mouth daily.     . Oxycodone HCl 10 MG TABS Take 0.5-1 tablets (5-10 mg total) by mouth every 4 (four) hours as needed. 15 tablet 0   . paliperidone (INVEGA) 9 MG 24 hr tablet Take 1 tablet (9 mg total) by mouth at bedtime.       Patient Stressors: Health problems Legal issue Traumatic event  Patient Strengths: Ability for insight Capable of independent living Communication skills Motivation for treatment/growth Supportive family/friends  Treatment Modalities: Medication Management, Group therapy, Case management,  1 to 1 session with clinician, Psychoeducation, Recreational therapy.   Physician Treatment Plan for Primary Diagnosis:  Schizoaffective disorder, bipolar type (HCC) Long Term Goal(s): Improvement in symptoms so as ready for discharge Improvement in symptoms so as ready for discharge   Short Term Goals: Ability to identify changes in lifestyle to reduce recurrence of condition will improve Ability to verbalize feelings will improve Ability to disclose and discuss suicidal ideas Ability to demonstrate self-control will improve Ability to identify and develop effective coping behaviors will improve Compliance with prescribed medications will improve Ability to identify triggers associated with substance abuse/mental health issues will improve Ability to identify changes in lifestyle to reduce recurrence of condition will improve Ability to verbalize feelings will improve Ability to disclose and discuss suicidal ideas Ability to demonstrate self-control will improve Ability to identify and develop effective coping behaviors will improve Compliance with prescribed medications will improve Ability to identify triggers associated with substance abuse/mental health issues will improve  Medication Management: Evaluate patient's response, side effects, and tolerance of medication regimen.  Therapeutic Interventions: 1 to 1 sessions, Unit Group sessions and Medication administration.  Evaluation of Outcomes: Progressing  Physician Treatment Plan for Secondary Diagnosis: Principal Problem:   Schizoaffective disorder, bipolar type (HCC) Active Problems:   Insomnia   Anxiety disorder, unspecified  Long Term Goal(s): Improvement in symptoms so as ready for discharge Improvement in symptoms so as ready for discharge   Short Term Goals: Ability to identify changes in lifestyle to reduce recurrence of condition will improve Ability to verbalize feelings will improve Ability to disclose and discuss suicidal ideas Ability to demonstrate self-control will improve Ability to identify and develop effective coping  behaviors will improve Compliance with prescribed medications will improve Ability to identify triggers associated with substance abuse/mental health issues will improve Ability to identify changes in lifestyle to reduce recurrence of condition will improve Ability to verbalize feelings will improve Ability to disclose and discuss suicidal ideas Ability to demonstrate self-control will improve Ability to identify and develop effective coping behaviors will improve Compliance with prescribed medications will improve Ability to identify triggers associated with substance abuse/mental health issues will improve     Medication Management: Evaluate patient's response, side effects, and tolerance of medication regimen.  Therapeutic Interventions: 1 to 1 sessions, Unit Group sessions and Medication administration.  Evaluation of Outcomes: Progressing   RN Treatment Plan for Primary Diagnosis: Schizoaffective disorder, bipolar type (HCC) Long Term Goal(s): Knowledge of disease and therapeutic regimen to maintain health will improve  Short Term Goals: Ability to remain free from injury will improve, Ability to verbalize frustration and anger appropriately will improve, Ability to demonstrate self-control, Ability to identify and develop effective coping behaviors will improve and  Compliance with prescribed medications will improve  Medication Management: RN will administer medications as ordered by provider, will assess and evaluate patient's response and provide education to patient for prescribed medication. RN will report any adverse and/or side effects to prescribing provider.  Therapeutic Interventions: 1 on 1 counseling sessions, Psychoeducation, Medication administration, Evaluate responses to treatment, Monitor vital signs and CBGs as ordered, Perform/monitor CIWA, COWS, AIMS and Fall Risk screenings as ordered, Perform wound care treatments as ordered.  Evaluation of Outcomes:  Progressing   LCSW Treatment Plan for Primary Diagnosis: Schizoaffective disorder, bipolar type (HCC) Long Term Goal(s): Safe transition to appropriate next level of care at discharge, Engage patient in therapeutic group addressing interpersonal concerns.  Short Term Goals: Engage patient in aftercare planning with referrals and resources, Increase social support, Increase ability to appropriately verbalize feelings, Identify triggers associated with mental health/substance abuse issues and Increase skills for wellness and recovery  Therapeutic Interventions: Assess for all discharge needs, 1 to 1 time with Social worker, Explore available resources and support systems, Assess for adequacy in community support network, Educate family and significant other(s) on suicide prevention, Complete Psychosocial Assessment, Interpersonal group therapy.  Evaluation of Outcomes: Progressing   Progress in Treatment: Attending groups: Yes. and No. Participating in groups: Yes. and No. Taking medication as prescribed: Yes. Toleration medication: Yes. Family/Significant other contact made: Yes, individual(s) contacted:  mother Patient understands diagnosis: Yes. Discussing patient identified problems/goals with staff: Yes. Medical problems stabilized or resolved: Yes. Denies suicidal/homicidal ideation: Yes. Issues/concerns per patient self-inventory: No.    New problem(s) identified: No, Describe:  none  New Short Term/Long Term Goal(s): medication stabilization, elimination of SI thoughts, development of comprehensive mental wellness plan.   Patient Goals:  Did not attend  Discharge Plan or Barriers: CSW will continue to follow and assess for appropriate referrals and possible discharge planning.     Reason for Continuation of Hospitalization: Delusions  Depression Hallucinations Medical Issues Medication stabilization  Estimated Length of Stay: 3-5 days  Attendees: Patient:  01/19/2021  11:27 AM  Physician:  01/19/2021 11:27 AM  Nursing:  01/19/2021 11:27 AM  RN Care Manager: 01/19/2021 11:27 AM  Social Worker: Ruthann Cancer, LCSW 01/19/2021 11:27 AM  Recreational Therapist:  01/19/2021 11:27 AM  Other:  01/19/2021 11:27 AM  Other:  01/19/2021 11:27 AM  Other: 01/19/2021 11:27 AM    Scribe for Treatment Team: Felizardo Hoffmann, LCSWA 01/19/2021 11:27 AM

## 2021-01-19 NOTE — Progress Notes (Signed)
[facility] MD Progress Note  01/19/2021 3:06 PM Sean Shepherd  MRN:  [id number]   Reason for admission: Carlon Reiseris a 45 year old male with prior diagnosesof schizoaffectivedisorder versus schizophrenia, depression and past marijuana use who was transferred from [facility] medical unit after prolonged hospitalization for recovery from multiple significant self-inflicted injuries in response to command auditory hallucinations in a suicide attempt.  Objective: Medical record reviewed.  Case discussed in detail with members of the treatment team.  I met with and evaluated the patient for follow-up today in the office on the unit.  Patient reports that he slept poorly last night due to vivid dreams, racing thoughts about past traumatic events and worry about how he will manage in the future.  He had confusion at night when he woke up but denies confusion today.  He describes his mood as anxious about the future and depressed.  Patient states that he is frightened he will go to hell when he dies.  He has passive wishes not to be alive but denies active SI, AI, HI.  He continues to be paranoid that law enforcement will imminently come to arrest him for his past marijuana use even though he has not engaged in use for 6 months.  He also expresses concerns that his family will be arrested because of his past marijuana use.  Patient reports experiencing auditory hallucinations which he describes as transformation of external noises "through tinnitus in my left ear into a voice" telling him that he is worthless or telling him what to do and at times tell him to hurt himself but he states he can refrain from acting on them.  Patient states voices are a bit quieter than on admission.  He reports occasional vivid visual experiences of seeing faces when he looks at objects or when he has his eyes closed.  The patient experienced some lightheadedness this morning but denies other medication side effects.  He  denies constipation and states his last bowel movement was yesterday.  He is urinating okay.  He denies any other physical complaints.  The patient slept 1.5 hours last night.  This morning include BP of 106/74 sitting and 86/53 standing, pulse of 86 sitting and 93 standing, temperature of 97.8 and O2 sat of 98%.  Weight today was 67.6 kg.  Patient showered today and had his wounds cleaned and bandaged.  Patient's group attendance has been intermittent.  He is eating his meals in the dining hall.  Principal Problem: Schizoaffective disorder, bipolar type ([place]) Diagnosis: Principal Problem:   Schizoaffective disorder, bipolar type ([place]) Active Problems:   Insomnia   Anxiety disorder, unspecified  Total Time spent with patient: 20 minutes  Past Psychiatric History: See admission H&P  Past Medical History:  Past Medical History:  Diagnosis Date  . Anxiety disorder, unspecified 01/10/2021  . Bipolar disorder ([place])   . Depression   . Schizoaffective disorder [facility])     Past Surgical History:  Procedure Laterality Date  . LAPAROTOMY N/A 12/25/2020   Procedure: EXPLORATORY LAPAROTOMY; REPAIR OF GROIN LACERATIONS X2, RIGHT THIGH LACERATION X 2; REPAIR RIGHT NECK AND LEFT NECK LACERATION AND REPAIR OF LEFT WRIST LACERATION;  Surgeon: Georganna Skeans, MD;  Location: [place];  Service: General;  Laterality: N/A;  . NO PAST SURGERIES     Family History: History reviewed. No pertinent family history. Family Psychiatric  History: See admission H&P Social History:  Social History   Substance and Sexual Activity  Alcohol Use Not Currently  Social History   Substance and Sexual Activity  Drug Use Yes  . Types: Marijuana    Social History   Socioeconomic History  . Marital status: Single    Spouse name: Not on file  . Number of children: 0  . Years of education: Not on file  . Highest education level: Not on file  Occupational History  . Not on file  Tobacco Use  . Smoking status:  Former Smoker    Quit date: 07/31/2014    Years since quitting: 6.4  . Smokeless tobacco: Never Used  Vaping Use  . Vaping Use: Every day  . Substances: THC, CBD  Substance and Sexual Activity  . Alcohol use: Not Currently  . Drug use: Yes    Types: Marijuana  . Sexual activity: Never  Other Topics Concern  . Not on file  Social History Narrative  . Not on file   Social Determinants of Health   Financial Resource Strain: Not on file  Food Insecurity: Not on file  Transportation Needs: Not on file  Physical Activity: Not on file  Stress: Not on file  Social Connections: Not on file   Additional Social History:                         Sleep: Poor  Appetite:  Fair  Current Medications: Current Facility-Administered Medications  Medication Dose Route Frequency Provider Last Rate Last Admin  . acetaminophen (TYLENOL) tablet 650 mg  650 mg Oral Q6H PRN Briant Cedar, MD      . alum & mag hydroxide-simeth (MAALOX/MYLANTA) 200-200-20 MG/5ML suspension 30 mL  30 mL Oral Q4H PRN Briant Cedar, MD      . chlorproMAZINE (THORAZINE) tablet 50 mg  50 mg Oral QHS Arthor Captain, MD   50 mg at 01/18/21 2110  . cloZAPine (CLOZARIL) tablet 100 mg  100 mg Oral QHS Arthor Captain, MD      . cloZAPine (CLOZARIL) tablet 25 mg  25 mg Oral Daily Arthor Captain, MD   25 mg at 01/19/21 0740  . docusate sodium (COLACE) capsule 200 mg  200 mg Oral BID Arthor Captain, MD   200 mg at 01/19/21 0741  . feeding supplement (ENSURE ENLIVE / ENSURE PLUS) liquid 237 mL  237 mL Oral TID BM Briant Cedar, MD   237 mL at 01/18/21 2111  . gabapentin (NEURONTIN) capsule 300 mg  300 mg Oral TID Briant Cedar, MD   300 mg at 01/19/21 1304  . OLANZapine zydis (ZYPREXA) disintegrating tablet 5 mg  5 mg Oral Q8H PRN Harlow Asa, MD   5 mg at 01/19/21 0106   And  . LORazepam (ATIVAN) tablet 1 mg  1 mg Oral PRN Nelda Marseille, Amy E, MD       And  . ziprasidone (GEODON)  injection 20 mg  20 mg Intramuscular PRN Nelda Marseille, Amy E, MD      . magnesium hydroxide (MILK OF MAGNESIA) suspension 30 mL  30 mL Oral Daily PRN Briant Cedar, MD      . melatonin tablet 5 mg  5 mg Oral QHS Arthor Captain, MD      . multivitamin with minerals tablet 1 tablet  1 tablet Oral Daily Briant Cedar, MD   1 tablet at 01/19/21 0741  . mupirocin cream (BACTROBAN) 2 %   Topical TID Sharma Covert, MD      . neomycin-bacitracin-polymyxin (  NEOSPORIN) ointment   Topical BID Harlow Asa, MD   Given at 01/19/21 0740  . neomycin-bacitracin-polymyxin (NEOSPORIN) ointment   Topical TID Sharma Covert, MD   Given at 01/19/21 1452  . polyethylene glycol (MIRALAX / GLYCOLAX) packet 17 g  17 g Oral Daily Briant Cedar, MD   17 g at 01/17/21 0825    Lab Results:  Results for orders placed or performed during the hospital encounter of 01/09/21 (from the past 48 hour(s))  CBC with Differential/Platelet     Status: Abnormal   Collection Time: 01/18/21  6:45 AM  Result Value Ref Range   WBC 4.7 4.0 - 10.5 K/uL   RBC 3.92 (L) 4.22 - 5.81 MIL/uL   Hemoglobin 11.6 (L) 13.0 - 17.0 g/dL   HCT 36.1 (L) 39.0 - 52.0 %   MCV 92.1 80.0 - 100.0 fL   MCH 29.6 26.0 - 34.0 pg   MCHC 32.1 30.0 - 36.0 g/dL   RDW 13.3 11.5 - 15.5 %   Platelets 314 150 - 400 K/uL   nRBC 0.0 0.0 - 0.2 %   Neutrophils Relative % 55 %   Neutro Abs 2.6 1.7 - 7.7 K/uL   Lymphocytes Relative 28 %   Lymphs Abs 1.3 0.7 - 4.0 K/uL   Monocytes Relative 11 %   Monocytes Absolute 0.5 0.1 - 1.0 K/uL   Eosinophils Relative 5 %   Eosinophils Absolute 0.2 0.0 - 0.5 K/uL   Basophils Relative 1 %   Basophils Absolute 0.0 0.0 - 0.1 K/uL   Immature Granulocytes 0 %   Abs Immature Granulocytes 0.01 0.00 - 0.07 K/uL    Comment: Performed at [facility], [place] [address], [place], [place] [address]  Troponin I (High Sensitivity)     Status: None   Collection Time: 01/18/21  6:45 AM   Result Value Ref Range   Troponin I (High Sensitivity) 14 <18 ng/L    Comment: (NOTE) Elevated high sensitivity troponin I (hsTnI) values and significant  changes across serial measurements may suggest ACS but many other  chronic and acute conditions are known to elevate hsTnI results.  Refer to the "Links" section for chest pain algorithms and additional  guidance. Performed at [facility], [place] [address], [place], [place] [address]     Blood Alcohol level:  Lab Results  Component Value Date   ETH <10 12/25/2020   ETH <10 01/60/1093    Metabolic Disorder Labs: Lab Results  Component Value Date   HGBA1C 5.2 01/09/2021   MPG 102.54 01/09/2021   MPG 117 06/28/2020   No results found for: PROLACTIN Lab Results  Component Value Date   CHOL 188 11/18/2020   TRIG 124 11/18/2020   HDL 69 11/18/2020   CHOLHDL 2.7 11/18/2020   VLDL 25 11/18/2020   LDLCALC 94 11/18/2020   LDLCALC 128 (H) 06/28/2020    Physical Findings: AIMS: Facial and Oral Movements Muscles of Facial Expression: None, normal Lips and Perioral Area: None, normal Jaw: None, normal Tongue: None, normal,Extremity Movements Upper (arms, wrists, hands, fingers): None, normal Lower (legs, knees, ankles, toes): None, normal, Trunk Movements Neck, shoulders, hips: None, normal, Overall Severity Severity of abnormal movements (highest score from questions above): None, normal Incapacitation due to abnormal movements: None, normal Patient's awareness of abnormal movements (rate only patient's report): No Awareness, Dental Status Current problems with teeth and/or dentures?: No Does patient usually wear dentures?: No  CIWA:    COWS:  Musculoskeletal: Strength & Muscle Tone: within normal limits Gait & Station: normal Patient leans: N/A  Psychiatric Specialty Exam:  Presentation  General Appearance: Appropriate for Environment; Neat  Eye Contact:Good  Speech:Clear and Coherent;  Normal Rate  Speech Volume:Normal  Handedness:Right   Mood and Affect  Mood:Anxious; Depressed  Affect:Congruent   Thought Process  Thought Processes:Coherent; Goal Directed  Descriptions of Associations:Intact  Orientation:Full (Time, Place and Person)  Thought Content:Perseveration; Rumination; Paranoid Ideation (Intrusive thoughts of past traumatic events; paranoia that he may be arrested for past marijuana use or that his parents may be arrested)  History of Schizophrenia/Schizoaffective disorder:Yes  Duration of Psychotic Symptoms:Greater than six months  Hallucinations:Hallucinations: Auditory  Ideas of Reference:Paranoia  Suicidal Thoughts:Suicidal Thoughts: Yes, Passive SI Passive Intent and/or Plan: Without Intent; Without Plan  Homicidal Thoughts:Homicidal Thoughts: No   Sensorium  Memory:Immediate Good; Recent Good; Remote Good  Judgment:Fair  Insight:Fair   Executive Functions  Concentration:Good  Attention Span:Good  Recall:Good  Fund of Knowledge:Good  Language:Good   Psychomotor Activity  Psychomotor Activity:Psychomotor Activity: Normal   Assets  Assets:Communication Skills; Desire for Improvement; Resilience; Social Support   Sleep  Sleep:Sleep: Poor Number of Hours of Sleep: 1.5    Physical Exam: Physical Exam Vitals and nursing note reviewed.  HENT:     Head: Normocephalic.  Pulmonary:     Effort: Pulmonary effort is normal.  Neurological:     General: No focal deficit present.     Mental Status: He is alert and oriented to person, place, and time.    Review of Systems  Constitutional: Negative for chills, diaphoresis and fever.  Respiratory: Negative for cough and shortness of breath.   Cardiovascular: Negative for chest pain and palpitations.  Gastrointestinal: Negative for constipation, diarrhea, nausea and vomiting.  Genitourinary: Negative.   Musculoskeletal: Negative.   Neurological: Positive for  dizziness. Negative for tremors, seizures and headaches.       Positive for orthostasis  Psychiatric/Behavioral: Positive for depression, hallucinations and suicidal ideas. The patient is nervous/anxious and has insomnia.    Blood pressure (!) 86/53, pulse 93, temperature 97.8 F (36.6 C), temperature source Oral, resp. rate 20, height 6' (1.829 m), weight 67.6 kg, SpO2 98 %. Body mass index is 20.21 kg/m.   Treatment Plan Summary: Daily contact with patient to assess and evaluate symptoms and progress in treatment and Medication management   Continue IVC status  Have discontinued one-to-one observation as patient has appeared steady with ambulation.  Will resume every 15-minute observation status.  Encouragedparticipation in group therapy and therapeutic milieu  Encouragedmovement around unit, getting out of bed, attempting spending time in day room.   Psychosis -Increase clozapine to 67m AM/1034mPM dosetoday.  Patient appears to be tolerating clozapine well without significant side effects other than some intermittent mild orthostasis which may also be secondary to Thorazine as well as poor p.o. intake. Anticipate further upward titration as indicated/tolerated. CBC, ANC AND TROPONIN WNL on 01/18/2021.  -ContinueThorazine to 5019mnightly.  Morning dose has been discontinued. Plan for eventual discontinuation as clozapine dose is increased. -Weekly CBC with WBC and ANCand weekly troponinswith next draw 01/24/21. -Weekly EKG performed 01/18/2021 revealed normal sinus rhythm, ventricular rate of 89 and QT/QTc of 366/445..  [place] Kitchenepression/anxiety -Increase gabapentin 300 mg 3 times daily and 400m70mS for anxiety -Anticipate initiation of trial of sertraline this week to target anxiety and depression symptoms  Constipation -Continuedocusate 200mg27mce daily -Continue MOM as needed -Continue MiraLAX daily -Consider addition of another agent if constipation  persists -Push p.o. fluids -Ambulation and out of bed encouraged  Insomnia -Changemelatonin 67m po QHS standing dose and 570mQHS prn  Wound Care -- dry dressing daily and will add neosporin to knee wounds  -- patient encouraged to attend to ADLs and shower - staff to assist -- Checking post-void bladder scan for signs of urinary retention  Disposition planning in progress.   Given severity of patient's illness and seriousness of recent life-threatening self-mutilation and suicide attempt, [facility]   MaArthor CaptainMD 01/19/2021, 3:06 PM

## 2021-01-19 NOTE — [facility] Group Notes (Signed)
Pt did not attend morning goals group. 

## 2021-01-19 NOTE — Plan of Care (Signed)
  Problem: Medication: Goal: Compliance with prescribed medication regimen will improve Outcome: Progressing   Problem: Self-Concept: Goal: Ability to disclose and discuss suicidal ideas will improve Outcome: Progressing Goal: Will verbalize positive feelings about self Outcome: Progressing   

## 2021-01-19 NOTE — Progress Notes (Signed)
Recreation Therapy Notes  Date: 01/19/2021 Time: 900a  Topic: Stress Management   Goal Area(s) Addresses:  Patient will review and complete packet supporting identification of stressors and and techniques to combat compounding stress.   Intervention: Independent Workbook   Education: Stress, Time Management, Coping Skills, Lifestyle Changes, Discharge Planning   Comments: LRT provided pt a workbook reviewing stress management concepts and offering an opportunity to create a plan to address stressors. Pt given the option to complete the packet in the dayroom with RN/MHT staff and peers or at their own pace in their room.   Alfretta Pinch, LRT/CTRS 

## 2021-01-19 NOTE — Progress Notes (Signed)
   01/19/21 0556  Vital Signs  Pulse Rate 93  BP (!) 86/53  BP Location Left Arm  BP Method Automatic  Patient Position (if appropriate) Standing   Pt feeling a little lightheaded. Given 12 oz Gatorade to drink and walked to his room. Will continue to monitor.

## 2021-01-19 NOTE — Progress Notes (Signed)
Concerns discussed with Dr. Jola Babinski and Dr. Fayrene Fearing. Treatment plan for wound care and management updated. Wound care provided per orders to abdomen and R ear. Will continue to monitor. Pt resting now.

## 2021-01-19 NOTE — Progress Notes (Signed)
Progress note    01/19/21 0740  Psych Admission Type (Psych Patients Only)  Admission Status Voluntary  Psychosocial Assessment  Patient Complaints Anxiety  Eye Contact Avertive;Brief  Facial Expression Anxious;Pensive;Worried  Affect Anxious  Speech Logical/coherent  Interaction Cautious;Forwards little;Guarded;Isolative  Motor Activity Slow  Appearance/Hygiene Unremarkable  Behavior Characteristics Cooperative;Appropriate to situation;Anxious;Guarded  Mood Anxious;Preoccupied;Pleasant  Thought Process  Coherency WDL  Content Blaming self  Delusions None reported or observed  Perception Hallucinations  Hallucination Auditory ("none today")  Judgment Poor  Confusion None  Danger to Self  Current suicidal ideation? Denies  Danger to Others  Danger to Others None reported or observed  Danger to Others Abnormal  Harmful Behavior to others No threats or harm toward other people  Destructive Behavior No threats or harm toward property

## 2021-01-19 NOTE — Progress Notes (Addendum)
MHT said that pt up and trying to go in another pt's room. Pt says he had a nightmare and thought he was in a yoga class. Pt with rapid speech and racing thoughts. Pt says he is not sleeping well tonight. Reminded of our conversation about this possibility d/t changes being made in his medication. "The last time this happened, they were giving me Ambien." Pt assured he did not get Ambien tonight. Given Zyprexa 5 mg and Melatonin 5 mg. Will continue to monitor.

## 2021-01-19 NOTE — [facility] Group Notes (Signed)
Type of Therapy and Topic: Group Therapy: Overcoming Obstacles  Participation Level: Did Not Attend  Description of Group:  In this group patients will watch a Ted Talk titled, "Why We Should All Try Therapy". In this video, patients explore the benefits of going to therapy and how this choice may effect their life. This video also explores other emotions that may arise by attending therapy. Patients were also given a worksheet titled "Therapy Goals" that allows patients to explore their goals for therapy and how their life may be different once they have completed therapy.    Therapeutic Goals:  1. Patient will identify personal and current emotions as they relate to therapy.  2. Patient will identify barriers that currently interfere with them beginning therapy.  3. Patient will identify two goals they are willing to set for therapy.     Summary of Patient Progress: Did Not Attend  Fredirick Lathe, LCSWA Clinicial Social Worker [employer]

## 2021-01-20 DIAGNOSIS — F250 Schizoaffective disorder, bipolar type: Secondary | ICD-10-CM | POA: Diagnosis not present

## 2021-01-20 NOTE — [facility] Group Notes (Signed)
.  Psychoeducational Group Note  Date: 01/20/2021 Time: 0900-1000    Goal Setting   Purpose of Group: This group helps to provide patients with the steps of setting a goal that is specific, measurable, attainable, realistic and time specific. A discussion on how we keep ourselves stuck with negative self talk.    Participation Level:  Active  Participation Quality:  Appropriate  Affect:  Appropriate  Cognitive:  Appropriate  Insight:  Improving  Engagement in Group:  Engaged  Additional Comments:  Pt came into the group late. States that his energy level is a 2/10. Wants to get better. Dione Housekeeper

## 2021-01-20 NOTE — Progress Notes (Addendum)
   01/19/21 2000  Psych Admission Type (Psych Patients Only)  Admission Status Voluntary  Psychosocial Assessment  Patient Complaints Anxiety;Depression  Eye Contact Brief  Facial Expression Anxious;Pensive;Worried  Affect Anxious  Speech Logical/coherent;Rapid  Interaction Cautious;Forwards little;Guarded;Isolative  Motor Activity Slow  Appearance/Hygiene Unremarkable  Behavior Characteristics Cooperative;Anxious  Mood Depressed;Anxious  Thought Process  Coherency WDL  Content Blaming self  Delusions None reported or observed  Perception Hallucinations  Hallucination Auditory  Judgment Poor  Confusion None  Danger to Self  Current suicidal ideation? Denies  Danger to Others  Danger to Others None reported or observed   Pt seen in his room. Pt denies SI, HI, and pain. Pt endorses AH. Pt seems very sleepy and can hardly stay awake. Pt told that he needs to move and interact more during the day so that he can sleep well at night. Discussed the fact that pt will have a roommate at some point during his stay. Pt also encouraged to attend groups. Pt states he was too tired to attend this evening's group.

## 2021-01-20 NOTE — Progress Notes (Signed)
[facility] MD Progress Note  01/20/2021 11:16 AM Sean Shepherd  MRN:  [id number]   Subjective:  Sean Shepherd reported " I am not feeling well, my head feels full."   Evaluation: Sean Shepherd was observed standing at the nurse stations. He presented with a flat and blunted affected. Denying suicidal ideations or homicidal ideation. Patient reported history with anxiety. Patient was initiated on Clozapine 100 mg p.o. nightly and  Thorazine 50 mg p.o. nightly.  Reports her recent titration to gabapentin as he is prescribed 300 mg p.o. 3 times daily 400 mg p.o. nightly.  CBC/ CMP to be repeated on 01/22/2021   Sean Shepherd reported auditory hallucinations however states "it is more of a ringing sound then voices at this time". Stated the voice has  Improvement since starting medications.  Reports ongoing paranoia and ruminations. "  I have a lot of anxiety over my actions."  He denied attending daily group session.  He reports he has been experiencing restlessness.  Chart review patient is noted to have slept for 5.5 hours.  Case staffed with attending for medication recommendations.  Will initiate BuSpar 5 mg p.o. twice daily. Patient declined. Staff to continue to monitor for safety. Support, encouragement and  reassurance was provided   Principal Problem: Schizoaffective disorder, bipolar type (HCC) Diagnosis: Principal Problem:   Schizoaffective disorder, bipolar type (HCC) Active Problems:   Insomnia   Anxiety disorder, unspecified  Total Time spent with patient: 15 minutes  Past Psychiatric History:   Past Medical History:  Past Medical History:  Diagnosis Date  . Anxiety disorder, unspecified 01/10/2021  . Bipolar disorder (HCC)   . Depression   . Schizoaffective disorder [facility])     Past Surgical History:  Procedure Laterality Date  . LAPAROTOMY N/A 12/25/2020   Procedure: EXPLORATORY LAPAROTOMY; REPAIR OF GROIN LACERATIONS X2, RIGHT THIGH LACERATION X 2; REPAIR RIGHT NECK AND LEFT NECK LACERATION  AND REPAIR OF LEFT WRIST LACERATION;  Surgeon: [name], MD;  Location: [facility] OR;  Service: General;  Laterality: N/A;  . NO PAST SURGERIES     Family History: History reviewed. No pertinent family history. Family Psychiatric  History:  Social History:  Social History   Substance and Sexual Activity  Alcohol Use Not Currently     Social History   Substance and Sexual Activity  Drug Use Yes  . Types: Marijuana    Social History   Socioeconomic History  . Marital status: Single    Spouse name: Not on file  . Number of children: 0  . Years of education: Not on file  . Highest education level: Not on file  Occupational History  . Not on file  Tobacco Use  . Smoking status: Former Smoker    Quit date: 07/31/2014    Years since quitting: 6.4  . Smokeless tobacco: Never Used  Vaping Use  . Vaping Use: Every day  . Substances: THC, CBD  Substance and Sexual Activity  . Alcohol use: Not Currently  . Drug use: Yes    Types: Marijuana  . Sexual activity: Never  Other Topics Concern  . Not on file  Social History Narrative  . Not on file   Social Determinants of Health   Financial Resource Strain: Not on file  Food Insecurity: Not on file  Transportation Needs: Not on file  Physical Activity: Not on file  Stress: Not on file  Social Connections: Not on file   Additional Social History:  Sleep: Fair  Appetite:  Fair   Current Medications Current Facility-Administered Medications  Medication Dose Route Frequency Provider Last Rate Last Admin  . acetaminophen (TYLENOL) tablet 650 mg  650 mg Oral Q6H PRN Lauro Franklin, MD      . alum & mag hydroxide-simeth (MAALOX/MYLANTA) 200-200-20 MG/5ML suspension 30 mL  30 mL Oral Q4H PRN Lauro Franklin, MD      . chlorproMAZINE (THORAZINE) tablet 50 mg  50 mg Oral QHS Claudie Revering, MD   50 mg at 01/19/21 2216  . cloZAPine (CLOZARIL) tablet 100 mg  100 mg Oral QHS Claudie Revering, MD   100 mg at 01/19/21 2216  . cloZAPine (CLOZARIL) tablet 25 mg  25 mg Oral Daily Claudie Revering, MD   25 mg at 01/20/21 0900  . docusate sodium (COLACE) capsule 200 mg  200 mg Oral BID Claudie Revering, MD   200 mg at 01/20/21 0900  . feeding supplement (ENSURE ENLIVE / ENSURE PLUS) liquid 237 mL  237 mL Oral TID BM Lauro Franklin, MD   237 mL at 01/20/21 1040  . gabapentin (NEURONTIN) capsule 300 mg  300 mg Oral TID Lauro Franklin, MD   300 mg at 01/20/21 0900  . gabapentin (NEURONTIN) capsule 400 mg  400 mg Oral QHS Claudie Revering, MD   400 mg at 01/19/21 2216  . OLANZapine zydis (ZYPREXA) disintegrating tablet 5 mg  5 mg Oral Q8H PRN Comer Locket, MD   5 mg at 01/19/21 0106   And  . LORazepam (ATIVAN) tablet 1 mg  1 mg Oral PRN Mason Jim, Amy E, MD       And  . ziprasidone (GEODON) injection 20 mg  20 mg Intramuscular PRN Mason Jim, Amy E, MD      . magnesium hydroxide (MILK OF MAGNESIA) suspension 30 mL  30 mL Oral Daily PRN Lauro Franklin, MD      . melatonin tablet 5 mg  5 mg Oral QHS Claudie Revering, MD   5 mg at 01/19/21 2216  . melatonin tablet 5 mg  5 mg Oral QHS PRN Claudie Revering, MD      . multivitamin with minerals tablet 1 tablet  1 tablet Oral Daily Lauro Franklin, MD   1 tablet at 01/19/21 0741  . mupirocin cream (BACTROBAN) 2 %   Topical TID Antonieta Pert, MD   Given at 01/20/21 0932  . neomycin-bacitracin-polymyxin (NEOSPORIN) ointment   Topical BID Comer Locket, MD   Given at 01/20/21 [phone]  . neomycin-bacitracin-polymyxin (NEOSPORIN) ointment   Topical TID Antonieta Pert, MD   Given at 01/20/21 [phone]  . polyethylene glycol (MIRALAX / GLYCOLAX) packet 17 g  17 g Oral Daily Lauro Franklin, MD   17 g at 01/20/21 0900    Lab Results: No results found for this or any previous visit (from the past 48 hour(s)).  Blood Alcohol level:  Lab Results  Component Value Date   ETH <10 12/25/2020   ETH <10 11/18/2020     Metabolic Disorder Labs: Lab Results  Component Value Date   HGBA1C 5.2 01/09/2021   MPG 102.54 01/09/2021   MPG 117 06/28/2020   No results found for: PROLACTIN Lab Results  Component Value Date   CHOL 188 11/18/2020   TRIG 124 11/18/2020   HDL 69 11/18/2020   CHOLHDL 2.7 11/18/2020   VLDL 25 11/18/2020   LDLCALC 94 11/18/2020   LDLCALC  128 (H) 06/28/2020    Physical Findings: AIMS: Facial and Oral Movements Muscles of Facial Expression: None, normal Lips and Perioral Area: None, normal Jaw: None, normal Tongue: None, normal,Extremity Movements Upper (arms, wrists, hands, fingers): None, normal Lower (legs, knees, ankles, toes): None, normal, Trunk Movements Neck, shoulders, hips: None, normal, Overall Severity Severity of abnormal movements (highest score from questions above): None, normal Incapacitation due to abnormal movements: None, normal Patient's awareness of abnormal movements (rate only patient's report): No Awareness, Dental Status Current problems with teeth and/or dentures?: No Does patient usually wear dentures?: No  CIWA:    COWS:     Musculoskeletal: Strength & Muscle Tone: Fair Gait & Station: normal Patient leans: N/A  Psychiatric Specialty Exam:  Presentation  General Appearance: Appropriate for Environment; Neat  Eye Contact:Good  Speech:Clear and Coherent; Normal Rate  Speech Volume:Normal  Handedness:Right   Mood and Affect  Mood:Anxious; Depressed  Affect:Congruent   Thought Process  Thought Processes:Coherent; Goal Directed  Descriptions of Associations:Intact  Orientation:Full (Time, Place and Person)  Thought Content:Perseveration; Rumination; Paranoid Ideation (Intrusive thoughts of past traumatic events; paranoia that he may be arrested for past marijuana use or that his parents may be arrested)  History of Schizophrenia/Schizoaffective disorder:Yes  Duration of Psychotic Symptoms:Greater than six  months  Hallucinations:Hallucinations: Auditory  Ideas of Reference:Paranoia  Suicidal Thoughts:Suicidal Thoughts: Yes, Passive SI Passive Intent and/or Plan: Without Intent; Without Plan  Homicidal Thoughts:Homicidal Thoughts: No   Sensorium  Memory:Immediate Good; Recent Good; Remote Good  Judgment:Fair  Insight:Fair   Executive Functions  Concentration:Good  Attention Span:Good  Recall:Good  Fund of Knowledge:Good  Language:Good   Psychomotor Activity  Psychomotor Activity:Psychomotor Activity: Normal   Assets  Assets:Communication Skills; Desire for Improvement; Resilience; Social Support   Sleep  Sleep:Sleep: Poor Number of Hours of Sleep: 1.5    Physical Exam: Physical Exam Vitals reviewed.  Cardiovascular:     Rate and Rhythm: Normal rate and regular rhythm.  Neurological:     Mental Status: He is alert.  Psychiatric:        Attention and Perception: He perceives auditory hallucinations.        Mood and Affect: Mood is anxious.        Speech: Speech normal.        Behavior: Behavior normal. Behavior is cooperative.        Thought Content: Thought content is paranoid.        Cognition and Memory: Cognition is impaired. He exhibits impaired recent memory.        Judgment: Judgment normal.    Review of Systems  Psychiatric/Behavioral: Positive for depression and hallucinations (aduitory halluications). Negative for suicidal ideas. The patient is nervous/anxious.   All other systems reviewed and are negative.  Blood pressure (!) 74/55, pulse (!) 102, temperature 97.6 F (36.4 C), temperature source Oral, resp. rate 20, height 6' (1.829 m), weight 67.6 kg, SpO2 98 %. Body mass index is 20.21 kg/m.   Treatment Plan Summary: Daily contact with patient to assess and evaluate symptoms and progress in treatment and Medication management   Schizoaffective disorder:  Continue gabapentin 300 mg p.o. 3 times daily and 400 mg p.o. nightly Continue  Clozaril  100 mg p.o. nightly Continue Thorazine 50 mg p.o. nightly  -Encourage increased hydration-blood pressure 74/55 heart rate 102 CSW to continue working on discharge disposition Chart reviewed [facility] encouraged to participate within the therapeutic milieu  [name], NP 01/20/2021, 11:16 AM

## 2021-01-20 NOTE — Progress Notes (Addendum)
D. Pt presents with a depressed affect and mood- calm, cooperative behavior. Per pt's self inventory, pt rated his depression, hopelessness and anxiety all 9's today. Pt reported that his goal today was "to be more active", "to walk around, go to cafeteria, and dayroom". Pt observed attending groups today, stating, "I want to get better". Pt currently denies SI/HI and AVH A. Labs and vitals monitored. Pt given and educated on medications.Wound care provided to abdomen. Noted were two small areas ( approx 2 cm) of dehiscence on pt's upper abdominal and lower abdominal wound. Scant amount of reddish drainage noted on old gauze. Pt supported emotionally and encouraged to express concerns and ask questions.   R. Pt remains safe with 15 minute checks. Will continue POC.

## 2021-01-20 NOTE — Progress Notes (Signed)
[facility] Group Notes:  (Nursing/MHT/Case Management/Adjunct)  Date:  01/20/2021  Time:  11:12 PM  Type of Therapy:  Group Therapy  Participation Level:  Active  Participation Quality:  Attentive  Affect:  Appropriate  Cognitive:  Appropriate  Insight:  Appropriate  Engagement in Group:  Developing/Improving  Modes of Intervention:  Discussion  Summary of Progress/Problems:  Sean Shepherd 01/20/2021, 11:12 PM

## 2021-01-21 DIAGNOSIS — F250 Schizoaffective disorder, bipolar type: Secondary | ICD-10-CM | POA: Diagnosis not present

## 2021-01-21 MED ORDER — CLOZAPINE 25 MG PO TABS
150.0000 mg | ORAL_TABLET | Freq: Every day | ORAL | Status: DC
  Administered 2021-01-21 – 2021-01-25 (×5): 150 mg via ORAL
  Filled 2021-01-21 (×8): qty 2

## 2021-01-21 MED ORDER — GABAPENTIN 400 MG PO CAPS
400.0000 mg | ORAL_CAPSULE | Freq: Two times a day (BID) | ORAL | Status: DC
  Administered 2021-01-22 – 2021-01-23 (×3): 400 mg via ORAL
  Filled 2021-01-21 (×7): qty 1

## 2021-01-21 MED ORDER — BUPROPION HCL 75 MG PO TABS
75.0000 mg | ORAL_TABLET | Freq: Every day | ORAL | Status: DC
  Administered 2021-01-21 – 2021-01-25 (×5): 75 mg via ORAL
  Filled 2021-01-21 (×8): qty 1

## 2021-01-21 NOTE — Progress Notes (Signed)
   01/20/21 2130  Psych Admission Type (Psych Patients Only)  Admission Status Voluntary  Psychosocial Assessment  Eye Contact Brief  Facial Expression Anxious;Pensive;Worried  Affect Anxious  Speech Logical/coherent;Rapid  Interaction Cautious;Forwards little;Guarded;Isolative  Motor Activity Slow  Appearance/Hygiene Unremarkable  Behavior Characteristics Appropriate to situation;Cooperative  Mood Depressed;Pleasant  Thought Process  Coherency WDL  Content Blaming self  Delusions None reported or observed  Perception Hallucinations  Hallucination None reported or observed  Judgment Poor  Confusion None  Danger to Self  Current suicidal ideation? Denies  Danger to Others  Danger to Others None reported or observed  Danger to Others Abnormal  Harmful Behavior to others No threats or harm toward other people  Destructive Behavior No threats or harm toward property

## 2021-01-21 NOTE — Progress Notes (Signed)
   01/20/21 2130  COVID-19 Daily Checkoff  Have you had a fever (temp > 37.80C/100F)  in the past 24 hours?  No  If you have had runny nose, nasal congestion, sneezing in the past 24 hours, has it worsened? No  COVID-19 EXPOSURE  Have you traveled outside the state in the past 14 days? No  Have you been in contact with someone with a confirmed diagnosis of COVID-19 or PUI in the past 14 days without wearing appropriate PPE? No  Have you been living in the same home as a person with confirmed diagnosis of COVID-19 or a PUI (household contact)? No  Have you been diagnosed with COVID-19? No

## 2021-01-21 NOTE — Progress Notes (Signed)
Adult Psychoeducational Group Note  Date:  01/21/2021 Time:  9:00 PM  Group Topic/Focus:  Wrap-Up Group:   The focus of this group is to help patients review their daily goal of treatment and discuss progress on daily workbooks.  Participation Level:  Did Not Attend  Participation Quality:Did not attend     Affect:  Did not attend   Cognitive: Did not attend   Insight: None  Engagement in Group: Did not attend   Modes of Intervention: Did not attend    Additional Comments:  Patient did not attend wrap up group     Nicoletta Dress 01/21/2021, 9:00 PM

## 2021-01-21 NOTE — Progress Notes (Addendum)
Pt denies SI/HI and VH. Pt endorses that his AH have eased down some. He said that he cannot make out what the voices are saying now because it's mumbling now. Pt said that he ate 50% of his meals today. This Clinical research associate continues to encourage pt to drink fluids throughout the day. Pt said that he has been able to sleep for periods greater then 30 minutes before he wakes up from his sleep and repeats the cycle again. Pt is still not able to identify any triggers or stressors that led to his suicide attempt. He said "I can't tell why." He said that he does keep the fan on in his room as background noise for distraction. He did go to the day room for a short period of time tonight. A small amount of brown drainage was noted to his mid abdominal area, less than the size of a pea. His dressings remain clean, dry, and intact. No signs of infection have been observed. Active listening, reassurance, and support provided. Q 15 min safety checks continue. Pt's safety has been maintained.    01/21/21 1945  Psych Admission Type (Psych Patients Only)  Admission Status Voluntary  Psychosocial Assessment  Patient Complaints Anxiety;Depression;Sadness  Eye Contact Brief  Facial Expression Anxious;Worried  Affect Anxious;Appropriate to circumstance;Depressed  Speech Logical/coherent  Interaction Assertive;Isolative  Motor Activity Slow  Appearance/Hygiene Unremarkable  Behavior Characteristics Cooperative;Appropriate to situation;Anxious  Mood Anxious;Depressed;Pleasant  Thought Process  Coherency WDL  Content WDL  Delusions None reported or observed  Perception Hallucinations  Hallucination Auditory  Judgment Poor  Confusion None  Danger to Self  Current suicidal ideation? Denies  Danger to Others  Danger to Others None reported or observed  Danger to Others Abnormal  Harmful Behavior to others No threats or harm toward other people  Destructive Behavior No threats or harm toward property

## 2021-01-21 NOTE — [facility] Group Notes (Signed)
Adult Psychoeducational Group Not Date:  01/21/2021 Time:  0900-1045 Group Topic/Focus: PROGRESSIVE RELAXATION. A group where deep breathing is taught and tensing and relaxation muscle groups is used. Imagery is used as well.  Pts are asked to imagine 3 pillars that hold them up when they are not able to hold themselves up.  Participation Level:  Did not attend     Mella Inclan A    

## 2021-01-21 NOTE — [facility] Group Notes (Signed)
Psychoeducational Group Note  Date: 01-21-21 Time:  1300  Group Topic/Focus:  Making Healthy Choices:   The focus of this group is to help patients identify negative/unhealthy choices they were using prior to admission and identify positive/healthier coping strategies to replace them upon discharge.In this group, patients started asking about the brain and how the brain works with and how the chemicals work for those who use substances, the pros and cons of saboxone.  Participation Level:  Did not attend   Vera Furniss A  
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
## 2021-01-21 NOTE — Progress Notes (Signed)
D. Pt presents with an depressed affect/ mood- stayed in his room for most of the morning, complained of feeling tired and was wanting to rest. Pt did attend afternoon group. Pt currently denies SI/HI and states that his AH have decreased. A. Labs and vitals monitored. Pt given Gatorade and encouraged to drink. Wound care performed as ordered. Small amount- quarter size- brownish yellow drainage noted on old gauze from lower mid abdomem. No redness noted around site, and pt denied increased pain.  Pt given and educated on medications. Pt supported emotionally and encouraged to express concerns and ask questions.   R. Pt remains safe with 15 minute checks. Will continue POC.

## 2021-01-22 NOTE — [facility] Group Notes (Signed)
Patient did not attend group   ADULT GRIEF GROUP NOTE:   Spiritual care group on grief and loss facilitated by chaplain Katy Curry Dulski, [facility]   Group Goal:   Support / Education around grief and loss   Members engage in facilitated group support and psycho-social education.   Group Description:   Following introductions and group rules, group members engaged in facilitated group dialog and support around topic of loss, with particular support around experiences of loss in their lives. Group Identified types of loss (relationships / self / things) and identified patterns, circumstances, and changes that precipitate losses. Reflected on thoughts / feelings around loss, normalized grief responses, and recognized variety in grief experience. Group noted Worden's four tasks of grief in discussion.   Group drew on Adlerian / Rogerian, narrative, MI,    

## 2021-01-22 NOTE — Progress Notes (Signed)
[facility] MD Progress Note  01/22/2021 5:50 PM Gilmore List  MRN:  [id number]   Reason for admission: Sean Reiseris a 45 year old male with prior diagnosesof schizoaffectivedisorder versus schizophrenia, depression and past marijuana use who was transferred from [facility] medical unit after prolonged hospitalization for recovery from multiple significant self-inflicted injuries in response to command auditory hallucinations in a suicide attempt.  Objective: Medical record reviewed.  Case discussed in detail with members of the treatment team. MD and medical student met with and evaluated the patient for follow-up today in the office on the unit. Jiyan reports that he feels "better" today. He states that his sleep has been better the last few nights.  He continues to experience racing thoughts when he tries to fall asleep with intrusive thoughts of past traumatic experiences and worries about the future.  The patient reports feeling tired and experiences lightheadedness upon standing in the morning. The patient states that he continues to experience external noises to be converted into messages through tinnitus in his left ear that tell patient he is "worthless or going to hell."  The patient continues to believe that he and/or his parents will get arrested and that it'll be a big "sting operation" on them because of his past marijuana use. He endorses some passive SI but denies active suicidal ideation.  He continues to report experiencing anxiety in groups and in the dining hall and worries that peers will judge him negatively because of his appearance.  Patient states he has always struggled with social anxiety.  He was encouraged to continue to attend meals in the dining hall and groups so as not to perpetuate worsening of social anxiety through use of avoidance.  We encouraged patient to also try to engage in reading or search or word when he is alone in his room during the day to keep  his mind engaged so it will not default to worry or negative thoughts. Patient states that his constipation has improved, he had a BM yesterday. He denies other medication side effects besides lightheadedness and dizziness upon standing which is especially worse in the morning.  Patient's grooming and eye contact have improved.  Thought processes appear coherent organized and goal-directed.  He does not appear to attend or respond to internal stimuli.  Staff notes document that patient slept 6.75 hours last night.  Vital signs today reveal BP 114/80 seated and 73/56 standing, pulse of 88 sitting and 96 standing,Temp 98.52F, O2 saturation 96% on room air.  No new labs today.  Nursing notes document that patient reports auditory hallucinations have diminished and consist of mumbling voices.  He has been intermittently attending groups.  Group participation has been sporadic.   Principal Problem: Schizoaffective disorder, bipolar type ([place]) Diagnosis: Principal Problem:   Schizoaffective disorder, bipolar type ([place]) Active Problems:   Insomnia   Anxiety disorder, unspecified  Total Time spent with patient: 20 minutes  Past Psychiatric History: see admission H&P  Past Medical History:  Past Medical History:  Diagnosis Date  . Anxiety disorder, unspecified 01/10/2021  . Bipolar disorder ([place])   . Depression   . Schizoaffective disorder [facility])     Past Surgical History:  Procedure Laterality Date  . LAPAROTOMY N/A 12/25/2020   Procedure: EXPLORATORY LAPAROTOMY; REPAIR OF GROIN LACERATIONS X2, RIGHT THIGH LACERATION X 2; REPAIR RIGHT NECK AND LEFT NECK LACERATION AND REPAIR OF LEFT WRIST LACERATION;  Surgeon: Georganna Skeans, MD;  Location: Agency;  Service: General;  Laterality: N/A;  . NO  PAST SURGERIES     Family History: History reviewed. No pertinent family history. Family Psychiatric  History: see admission H&P Social History:  Social History   Substance and Sexual Activity  Alcohol Use  Not Currently     Social History   Substance and Sexual Activity  Drug Use Yes  . Types: Marijuana    Social History   Socioeconomic History  . Marital status: Single    Spouse name: Not on file  . Number of children: 0  . Years of education: Not on file  . Highest education level: Not on file  Occupational History  . Not on file  Tobacco Use  . Smoking status: Former Smoker    Quit date: 07/31/2014    Years since quitting: 6.4  . Smokeless tobacco: Never Used  Vaping Use  . Vaping Use: Every day  . Substances: THC, CBD  Substance and Sexual Activity  . Alcohol use: Not Currently  . Drug use: Yes    Types: Marijuana  . Sexual activity: Never  Other Topics Concern  . Not on file  Social History Narrative  . Not on file   Social Determinants of Health   Financial Resource Strain: Not on file  Food Insecurity: Not on file  Transportation Needs: Not on file  Physical Activity: Not on file  Stress: Not on file  Social Connections: Not on file   Additional Social History:                         Sleep: Fair  Appetite:  Good  Current Medications: Current Facility-Administered Medications  Medication Dose Route Frequency Provider Last Rate Last Admin  . acetaminophen (TYLENOL) tablet 650 mg  650 mg Oral Q6H PRN Briant Cedar, MD      . alum & mag hydroxide-simeth (MAALOX/MYLANTA) 200-200-20 MG/5ML suspension 30 mL  30 mL Oral Q4H PRN Briant Cedar, MD      . buPROPion [facility]) tablet 75 mg  75 mg Oral Daily Sharma Covert, MD   75 mg at 01/22/21 0810  . cloZAPine (CLOZARIL) tablet 150 mg  150 mg Oral QHS Sharma Covert, MD   150 mg at 01/21/21 2114  . cloZAPine (CLOZARIL) tablet 25 mg  25 mg Oral Daily Arthor Captain, MD   25 mg at 01/22/21 6144  . docusate sodium (COLACE) capsule 200 mg  200 mg Oral BID Arthor Captain, MD   200 mg at 01/22/21 0809  . feeding supplement (ENSURE ENLIVE / ENSURE PLUS) liquid 237 mL  237 mL  Oral TID BM Briant Cedar, MD   237 mL at 01/22/21 1342  . gabapentin (NEURONTIN) capsule 400 mg  400 mg Oral QHS Arthor Captain, MD   400 mg at 01/21/21 2114  . gabapentin (NEURONTIN) capsule 400 mg  400 mg Oral BID Sharma Covert, MD   400 mg at 01/22/21 0810  . OLANZapine zydis (ZYPREXA) disintegrating tablet 5 mg  5 mg Oral Q8H PRN Harlow Asa, MD   5 mg at 01/21/21 1728   And  . LORazepam (ATIVAN) tablet 1 mg  1 mg Oral PRN Nelda Marseille, Amy E, MD       And  . ziprasidone (GEODON) injection 20 mg  20 mg Intramuscular PRN Nelda Marseille, Amy E, MD      . magnesium hydroxide (MILK OF MAGNESIA) suspension 30 mL  30 mL Oral Daily PRN Kai Levins Redgie Grayer, MD      .  melatonin tablet 5 mg  5 mg Oral QHS Arthor Captain, MD   5 mg at 01/21/21 2115  . melatonin tablet 5 mg  5 mg Oral QHS PRN Arthor Captain, MD      . multivitamin with minerals tablet 1 tablet  1 tablet Oral Daily Briant Cedar, MD   1 tablet at 01/22/21 0809  . mupirocin cream (BACTROBAN) 2 %   Topical TID Sharma Covert, MD   Given at 01/22/21 1258  . neomycin-bacitracin-polymyxin (NEOSPORIN) ointment   Topical BID Harlow Asa, MD   Given at 01/21/21 2130  . neomycin-bacitracin-polymyxin (NEOSPORIN) ointment   Topical TID Sharma Covert, MD   Given at 01/22/21 1258  . polyethylene glycol (MIRALAX / GLYCOLAX) packet 17 g  17 g Oral Daily Briant Cedar, MD   17 g at 01/22/21 4158    Lab Results: No results found for this or any previous visit (from the past 48 hour(s)).  Blood Alcohol level:  Lab Results  Component Value Date   ETH <10 12/25/2020   ETH <10 30/94/0768    Metabolic Disorder Labs: Lab Results  Component Value Date   HGBA1C 5.2 01/09/2021   MPG 102.54 01/09/2021   MPG 117 06/28/2020   No results found for: PROLACTIN Lab Results  Component Value Date   CHOL 188 11/18/2020   TRIG 124 11/18/2020   HDL 69 11/18/2020   CHOLHDL 2.7 11/18/2020   VLDL 25 11/18/2020    LDLCALC 94 11/18/2020   LDLCALC 128 (H) 06/28/2020    Physical Findings: AIMS: Facial and Oral Movements Muscles of Facial Expression: None, normal Lips and Perioral Area: None, normal Jaw: None, normal Tongue: None, normal,Extremity Movements Upper (arms, wrists, hands, fingers): None, normal Lower (legs, knees, ankles, toes): None, normal, Trunk Movements Neck, shoulders, hips: None, normal, Overall Severity Severity of abnormal movements (highest score from questions above): None, normal Incapacitation due to abnormal movements: None, normal Patient's awareness of abnormal movements (rate only patient's report): No Awareness, Dental Status Current problems with teeth and/or dentures?: No Does patient usually wear dentures?: No  CIWA:    COWS:     Musculoskeletal: Strength & Muscle Tone: within normal limits Gait & Station: normal Patient leans: N/A  Psychiatric Specialty Exam:  Presentation  General Appearance: Appropriate for Environment; Fairly Groomed  Eye Contact:Fair  Speech:Clear and Coherent; Normal Rate  Speech Volume:Normal  Handedness:Right   Mood and Affect  Mood:Anxious; Depressed  Affect:Congruent   Thought Process  Thought Processes:Coherent; Goal Directed; Linear  Descriptions of Associations:Intact  Orientation:Full (Time, Place and Person)  Thought Content:Paranoid Ideation; Rumination; Perseveration  History of Schizophrenia/Schizoaffective disorder:Yes  Duration of Psychotic Symptoms:Greater than six months  Hallucinations:Hallucinations: Auditory; Other (comment) (Reports decreased auditory hallucinations; continues to experience external sounds converted into sentences and messages) Description of Auditory Hallucinations: clarified voices vs. racing thoughts; pt states there are no voices coming from outside his head but that his mind will sometimes turn sounds (e.g. fuzzy intercom) into sentences.  Ideas of Reference:Paranoia  (Continued paranoid concerns that law enforcement will rest him or his family based on patient's past marijuana use)  Suicidal Thoughts:Suicidal Thoughts: Yes, Passive SI Passive Intent and/or Plan: Without Intent; Without Plan  Homicidal Thoughts:Homicidal Thoughts: No   Sensorium  Memory:Immediate Good; Recent Good; Remote Good  Judgment:Fair  Insight:Fair   Executive Functions  Concentration:Good  Attention Span:Good  Recall:Good  Fund of Knowledge:Good  Language:Good   Psychomotor Activity  Psychomotor Activity:Psychomotor Activity: Normal  Assets  Assets:Communication Skills; Desire for Improvement; Housing; Social Support   Sleep  Sleep:Sleep: Fair Number of Hours of Sleep: 6.75    Physical Exam: Physical Exam Vitals and nursing note reviewed.  Constitutional:      Appearance: Normal appearance.  HENT:     Head: Normocephalic.  Pulmonary:     Effort: Pulmonary effort is normal.  Musculoskeletal:     Cervical back: Normal range of motion.  Neurological:     Mental Status: He is alert and oriented to person, place, and time.    Review of Systems  Gastrointestinal: Negative for constipation.  Neurological: Positive for dizziness.       Positive for lightheadedness and dizziness on standing  All other systems reviewed and are negative.  Blood pressure (!) 73/56, pulse 96, temperature 98.4 F (36.9 C), temperature source Oral, resp. rate 20, height 6' (1.829 m), weight 66.2 kg, SpO2 96 %. Body mass index is 19.8 kg/m.   Treatment Plan Summary: Daily contact with patient to assess and evaluate symptoms and progress in treatment and Medication management   Continue IVC status  Continue every 15-minute observation status.  Encouragedparticipation in group therapy and therapeutic milieu  Encouragedmovement around unit, getting out of bed, going to cafeteria for meals, attempting spending time in day room.    Psychosis -Continueclozapine 46m AM/1585mPM dosetoday.  Patient appears to be tolerating clozapine well without significant side effects other than some intermittent mild orthostasis which may also be secondary to poor p.o. intake. Anticipate further upward titration as indicated/tolerated.CBC, ANC and TROPONIN WNL on 01/18/21. -Weekly CBC with WBC and ANCand weekly troponinswith next draw 01/25/21. -Weekly EKG performed 01/18/2021 revealed normal sinus rhythm, ventricular rate of 89 and QT/QTc of 366/445.  Depression/anxiety -Continue gabapentin 400 mg TID for anxiety -Continue Wellbutrin 7575maily    Constipation -Continuedocusate 200m35mice daily -Continue MOM as needed -Continue MiraLAX daily -Push p.o. fluids -Ambulation and out of bed encouraged  Insomnia -Continue melatonin 5mg 29mQHS standing dose and 5mg Q45mprn  Wound Care -- dry dressing daily and will add neosporin to knee wounds  -- patient encouraged to attend to ADLs and shower - staff to assist -- Checking post-void bladder scan for signs of urinary retention  Disposition planning in progress.   [facility]  HeatheMaxwell Marioncal Student MarthaEthelene BrownsI attest that I saw and interviewed the patient and that I performed or reperformed the mental status examination of the patient. I reviewed the medical record. I have formulated the plan of treatment. I am in agreement with the exam, assessment and plan as documented above.  MarthaArthor Captain/23/2022, 5:50 PM

## 2021-01-22 NOTE — Progress Notes (Signed)
   01/22/21 0624  Vital Signs  Temp 98.4 F (36.9 C)  Temp Source Oral  Pulse Rate 88  BP 114/80  BP Location Right Arm  BP Method Automatic  Patient Position (if appropriate) Sitting  Oxygen Therapy  SpO2 96 %   D: Patient denies SI/HI. Pt admits to hearing voices, that answer questions negatively about pt.Pt. rated both anxiety and depression 8/10. Pt. Did not eat breakfast and reported feeling "dizzy" Pt attended 2 afternoon groups with much encouragement. Pt. Is isolative and lays in bed with his covers on. Pt.'s wound on chest is closed, ointment, telfa and paper tape applied. Pt. Had red marks where plastic tape was applied. Pt. Needs verbal encouragement to eat, attend groups and to get out of bed.  A:  Patient took scheduled medicine.  Support and encouragement provided Routine safety checks conducted every 15 minutes. Patient  Informed to notify staff with any concerns.   R:  Safety maintained.

## 2021-01-22 NOTE — BHH Group Notes (Signed)
Occupational Therapy Group Note Date: 01/22/2021 Group Topic/Focus: Coping Skills  Group Description: Group encouraged increased engagement and participation through discussion and activity focused on Mental Health Awareness Month. May is Mental Health Awareness month and patients engaged in discussion surrounding the stigma surrounding mental health and feelings that they wanted to share/express. Patients were encouraged to engage in activity in which they created mental health ribbons that represented different diagnoses, symptoms, and things in which they would like to bring awareness too. Completed ribbons were then displayed around the unit for mental health awareness month.  Participation Level: Active   Participation Quality: Independent   Behavior: Calm and Cooperative   Speech/Thought Process: Directed   Affect/Mood: Anxious   Insight: Fair   Judgement: Fair   Individualization: Sean Shepherd was active in their participation of group discussion/activity. Pt worked quietly and independently, and shared his ribbon during discussion. Pt shared his ribbon represents schizophrenia and mental health awareness.   Modes of Intervention: Activity, Discussion and Education  Patient Response to Interventions:  Attentive, Engaged and Receptive   Plan: Continue to engage patient in OT groups 2 - 3x/week.  01/22/2021  Donne Hazel, MOT, OTR/L

## 2021-01-22 NOTE — BHH Group Notes (Signed)
LCSW Group Therapy Note 01/22/2021 1:15pm  Type of Therapy and Topic:  Group Therapy:  Setting Goals  Participation Level:  Minimal  Description of Group: In this process group, patients discussed using strengths to work toward goals and address challenges.  Patients identified two positive things about themselves and one goal they were working on.  Patients were given the opportunity to share openly and support each other's plan for self-empowerment.  The group discussed the value of gratitude and were encouraged to have a daily reflection of positive characteristics or circumstances.  Patients were encouraged to identify a plan to utilize their strengths to work on current challenges and goals.  Therapeutic Goals 1. Patient will verbalize personal strengths/positive qualities and relate how these can assist with achieving desired personal goals 2. Patients will verbalize affirmation of peers plans for personal change and goal setting 3. Patients will explore the value of gratitude and positive focus as related to successful achievement of goals 4. Patients will verbalize a plan for regular reinforcement of personal positive qualities and circumstances.  Summary of Patient Progress:  Pt came to group late but was able to provide verbal support to a peer during group.    Therapeutic Modalities Cognitive Behavioral Therapy Motivational Interviewing    Chrys Racer 01/22/2021 1:58 PM

## 2021-01-22 NOTE — Progress Notes (Signed)
Recreation Therapy Notes  Date:  5.23.22 Time: 0930 Location: [address] Dayroom  Group Topic: Stress Management  Goal Area(s) Addresses:  Patient will identify positive stress management techniques. Patient will identify benefits of using stress management post d/c.  Intervention: Stress Management  Activity:  Meditation.  LRT played a meditation that focused on calming anxiety.  Patients were to listen and follow along as meditation played to fully engage in activity.  Education:  Stress Management, Discharge Planning.   Education Outcome: Acknowledges Education  Clinical Observations/Feedback: Pt did not attend group session.   Daenerys Buttram, LRT/CTRS         Savon Cobbs A 01/22/2021 12:10 PM 

## 2021-01-23 MED ORDER — CLOZAPINE 25 MG PO TABS
ORAL_TABLET | ORAL | Status: AC
  Filled 2021-01-23: qty 1

## 2021-01-23 MED ORDER — GABAPENTIN 300 MG PO CAPS
300.0000 mg | ORAL_CAPSULE | Freq: Two times a day (BID) | ORAL | Status: DC
  Administered 2021-01-24 – 2021-01-25 (×3): 300 mg via ORAL
  Filled 2021-01-23 (×8): qty 1

## 2021-01-23 MED ORDER — CLOZAPINE 25 MG PO TABS
25.0000 mg | ORAL_TABLET | Freq: Every day | ORAL | Status: DC
  Administered 2021-01-24 – 2021-01-25 (×2): 25 mg via ORAL
  Filled 2021-01-23 (×4): qty 1

## 2021-01-23 MED ORDER — CLOZAPINE 25 MG PO TABS
50.0000 mg | ORAL_TABLET | Freq: Every day | ORAL | Status: DC
  Administered 2021-01-23: 50 mg via ORAL
  Filled 2021-01-23 (×2): qty 2

## 2021-01-23 NOTE — Progress Notes (Signed)
RN assisted pt while pt shaved his face this evening.  Pt said it felt good to be clean shaven.  Pt declined dressing change on abdomen this shift.  RN encouraged pt to allow staff to change dressing tomorrow.  Pt in agreement.  RN also encouraged pt to take a shower tomorrow and pt said he will consider taking a shower and have his dressing reapplied after his shower.

## 2021-01-23 NOTE — Progress Notes (Signed)
Pt did not attend afternoon group. 

## 2021-01-23 NOTE — Progress Notes (Signed)
Recreation Therapy Notes  Animal-Assisted Activity (AAA) Program Checklist/Progress Notes Patient Eligibility Criteria Checklist & Daily Group note for Rec Tx Intervention  Date: 5.24.22 Time: 1430 Location: [address] Dayroom   AAA/T Program Assumption of Risk Form signed by Patient/ or Parent Legal Guardian YES  Patient is free of allergies or severe asthma YES  Patient reports no fear of animals YES  Patient reports no history of cruelty to animals YES   Patient understands his/her participation is voluntary YES   Patient washes hands before animal contact YES   Patient washes hands after animal contact YES   Education: Hand Washing, Appropriate Animal Interaction   Education Outcome: Acknowledges understanding/In group clarification offered/Needs additional education.   Clinical Observations/Feedback: Pt did not attend activity.     Sean Shepherd, LRT/CTRS         Michiko Lineman A 01/23/2021 3:29 PM 

## 2021-01-23 NOTE — BHH Counselor (Signed)
CSW contacted [facility] and informed them that Mr. Braithwaite is still in need of a bed and their services.  CSW was informed that Mr. Milke continues to be on the waitlist at this time.

## 2021-01-23 NOTE — Progress Notes (Addendum)
   01/22/21 2000  Psych Admission Type (Psych Patients Only)  Admission Status Voluntary  Psychosocial Assessment  Patient Complaints Anxiety;Depression  Eye Contact Brief  Facial Expression Anxious;Worried  Affect Anxious;Appropriate to circumstance;Depressed  Speech Logical/coherent  Interaction Assertive  Motor Activity Slow  Appearance/Hygiene Unremarkable  Behavior Characteristics Cooperative;Anxious  Mood Anxious;Depressed;Pleasant  Thought Process  Coherency WDL  Content Blaming self  Delusions None reported or observed  Perception Hallucinations  Hallucination Auditory  Judgment Poor  Confusion None  Danger to Self  Current suicidal ideation? Denies  Danger to Others  Danger to Others None reported or observed   Pt denies SI, HI, VH today. Says he still hears the voices but they are not so loud. They do not tell him to hurt himself but tell him he is worthless. Pt says he ignores what the voices say. Pt rates anxiety 9/10 and depression 9/10 d/t worrying about his future in light of what he has done to himself. Pt refused any further dressing changes saying that he believes he is okay. Rates pain 3/10 in his abdominal area d/t increased movement. Pt encouraged to keep moving and the pain will subside. Pt says he went to gym today and it was a good change. Pt even talking about wanting to shave and wash his clothes.

## 2021-01-23 NOTE — BHH Group Notes (Signed)
Adult Psychoeducational Group Note  Date:  01/23/2021 Time:  9:08 AM  Group Topic/Focus:  Goals Group:   The focus of this group is to help patients establish daily goals to achieve during treatment and discuss how the patient can incorporate goal setting into their daily lives to aide in recovery.  Participation Level:  Did Not Attend   Additional Comments:  Pt did not attend morning goals group. Pt was given group materials on healthy communication for the afternoon session.   Sean Shepherd 01/23/2021, 9:08 AM

## 2021-01-23 NOTE — Progress Notes (Signed)
   01/23/21 0625  Vital Signs  Temp 98.2 F (36.8 C)  Temp Source Oral  Pulse Rate 96  BP (!) 98/53  BP Location Right Arm  BP Method Automatic  Patient Position (if appropriate) Standing  Oxygen Therapy  SpO2 100 %   Pt given Gatorade 12 oz d/t feeling some lightheadedness.

## 2021-01-23 NOTE — Progress Notes (Signed)
Pt reported "I'm very tired today, very weak, I think it's the medicine."  Pt said he was "so tired, I couldn't get up and go to lunch."  RN took pt's vitals, BP 125/81, pulse 99.  Pt said that he would like RN to assist pt with shaving later this afternoon before dinner, if pt was "feeling up to it."  RN notified MD re: pt's report of feeling tired and weak.  Pt denied dressing changes this shift.  RN will encourage dressing change when pt shaves later this afternoon.

## 2021-01-23 NOTE — Progress Notes (Signed)
[facility] MD Progress Note  01/23/2021 5:09 PM Alessio Bogan  MRN:  [id number]   Reason for admission: Kreston Reiseris a 45 year old male with prior diagnosesof schizoaffectivedisorder versus schizophrenia, depression and past marijuana use who was transferred from [facility] medical unit after prolonged hospitalization for recovery from multiple significant self-inflicted injuries in response to command auditory hallucinations in a suicide attempt.  Objective: Medical record reviewed.  Case discussed in detail with members of the treatment team.  I met with and evaluated the patient on the unit today for follow-up.  The patient is reclining in bed when I enter but sits up for our conversation.  He is appropriately groomed and maintains good eye contact.  He is appropriately engaged in our conversation.  Speech is of normal rate volume and amount.  Mood continues to be anxious and depressed.  Affect is constricted.  Thought processes are coherent and goal-directed but there is perseveration and obsessional thinking.  Patient is focused on intrusive thoughts of past traumatic events and worry about the future.  He also continues with significant social anxiety.  The patient states the voices are still telling him what to do and at times telling him to hurt himself but he can refrain from acting on them.  He feels that they are slightly reduced and he had longer periods of time yesterday when they were not bothering him.  He continues to experience some lightheadedness and dizziness when he goes from sitting to standing especially in the morning.  He denied other medication side effects this morning but reported this afternoon to nursing staff that he felt more tired today which patient attributed to the increase in his morning dose of clozapine today.  I will decrease the morning dose of clozapine back to 25 mg starting tomorrow morning and hold the nighttime dose at the current dose of 150 mg at  bedtime. During our meeting this morning, I discussed with patient the importance of spending time out of his room, going to meals and groups to reduce avoidance of being in social situations and to help keep his mind occupied.  The patient slept 4.75 hours last night.  No new labs today.  Vital signs this morning included BP of 119/81 sitting and 98/53 standing, pulse of 93 sitting and 96 standing, O2 sat of 100% and temperature of 98.2.  Vital signs repeated this afternoon included BP of 125/81 and pulse of 99.  Nursing notes document the patient did not go to groups today and did not go to lunch.  Principal Problem: Schizoaffective disorder, bipolar type ([place]) Diagnosis: Principal Problem:   Schizoaffective disorder, bipolar type ([place]) Active Problems:   Insomnia   Anxiety disorder, unspecified  Total Time spent with patient: 20 minutes  Past Psychiatric History: See admission H&P  Past Medical History:  Past Medical History:  Diagnosis Date  . Anxiety disorder, unspecified 01/10/2021  . Bipolar disorder ([place])   . Depression   . Schizoaffective disorder [facility])     Past Surgical History:  Procedure Laterality Date  . LAPAROTOMY N/A 12/25/2020   Procedure: EXPLORATORY LAPAROTOMY; REPAIR OF GROIN LACERATIONS X2, RIGHT THIGH LACERATION X 2; REPAIR RIGHT NECK AND LEFT NECK LACERATION AND REPAIR OF LEFT WRIST LACERATION;  Surgeon: Georganna Skeans, MD;  Location: [place];  Service: General;  Laterality: N/A;  . NO PAST SURGERIES     Family History: History reviewed. No pertinent family history. Family Psychiatric  History: See admission H&P Social History:  Social History  Substance and Sexual Activity  Alcohol Use Not Currently     Social History   Substance and Sexual Activity  Drug Use Yes  . Types: Marijuana    Social History   Socioeconomic History  . Marital status: Single    Spouse name: Not on file  . Number of children: 0  . Years of education: Not on file  . Highest  education level: Not on file  Occupational History  . Not on file  Tobacco Use  . Smoking status: Former Smoker    Quit date: 07/31/2014    Years since quitting: 6.4  . Smokeless tobacco: Never Used  Vaping Use  . Vaping Use: Every day  . Substances: THC, CBD  Substance and Sexual Activity  . Alcohol use: Not Currently  . Drug use: Yes    Types: Marijuana  . Sexual activity: Never  Other Topics Concern  . Not on file  Social History Narrative  . Not on file   Social Determinants of Health   Financial Resource Strain: Not on file  Food Insecurity: Not on file  Transportation Needs: Not on file  Physical Activity: Not on file  Stress: Not on file  Social Connections: Not on file   Additional Social History:                         Sleep: Fair  Appetite:  Fair  Current Medications: Current Facility-Administered Medications  Medication Dose Route Frequency Provider Last Rate Last Admin  . acetaminophen (TYLENOL) tablet 650 mg  650 mg Oral Q6H PRN Briant Cedar, MD      . alum & mag hydroxide-simeth (MAALOX/MYLANTA) 200-200-20 MG/5ML suspension 30 mL  30 mL Oral Q4H PRN Briant Cedar, MD      . buPROPion [facility]) tablet 75 mg  75 mg Oral Daily Sharma Covert, MD   75 mg at 01/23/21 0816  . cloZAPine (CLOZARIL) tablet 150 mg  150 mg Oral QHS Sharma Covert, MD   150 mg at 01/22/21 2116  . [START ON 01/24/2021] cloZAPine (CLOZARIL) tablet 25 mg  25 mg Oral Daily Arthor Captain, MD      . docusate sodium (COLACE) capsule 200 mg  200 mg Oral BID Arthor Captain, MD   200 mg at 01/23/21 0815  . feeding supplement (ENSURE ENLIVE / ENSURE PLUS) liquid 237 mL  237 mL Oral TID BM Briant Cedar, MD   237 mL at 01/23/21 1434  . gabapentin (NEURONTIN) capsule 400 mg  400 mg Oral QHS Arthor Captain, MD   400 mg at 01/22/21 2116  . gabapentin (NEURONTIN) capsule 400 mg  400 mg Oral BID Sharma Covert, MD   400 mg at 01/23/21 0810  .  OLANZapine zydis (ZYPREXA) disintegrating tablet 5 mg  5 mg Oral Q8H PRN Harlow Asa, MD   5 mg at 01/21/21 1728   And  . LORazepam (ATIVAN) tablet 1 mg  1 mg Oral PRN Nelda Marseille, Amy E, MD       And  . ziprasidone (GEODON) injection 20 mg  20 mg Intramuscular PRN Nelda Marseille, Amy E, MD      . magnesium hydroxide (MILK OF MAGNESIA) suspension 30 mL  30 mL Oral Daily PRN Briant Cedar, MD      . melatonin tablet 5 mg  5 mg Oral QHS Arthor Captain, MD   5 mg at 01/22/21 2116  . melatonin tablet  5 mg  5 mg Oral QHS PRN Arthor Captain, MD      . multivitamin with minerals tablet 1 tablet  1 tablet Oral Daily Briant Cedar, MD   1 tablet at 01/23/21 0810  . mupirocin cream (BACTROBAN) 2 %   Topical TID Sharma Covert, MD   Given at 01/22/21 1258  . neomycin-bacitracin-polymyxin (NEOSPORIN) ointment   Topical BID Harlow Asa, MD   Given at 01/21/21 2130  . neomycin-bacitracin-polymyxin (NEOSPORIN) ointment   Topical TID Sharma Covert, MD   Given at 01/22/21 1258  . polyethylene glycol (MIRALAX / GLYCOLAX) packet 17 g  17 g Oral Daily Briant Cedar, MD   17 g at 01/23/21 3710    Lab Results: No results found for this or any previous visit (from the past 48 hour(s)).  Blood Alcohol level:  Lab Results  Component Value Date   ETH <10 12/25/2020   ETH <10 62/69/4854    Metabolic Disorder Labs: Lab Results  Component Value Date   HGBA1C 5.2 01/09/2021   MPG 102.54 01/09/2021   MPG 117 06/28/2020   No results found for: PROLACTIN Lab Results  Component Value Date   CHOL 188 11/18/2020   TRIG 124 11/18/2020   HDL 69 11/18/2020   CHOLHDL 2.7 11/18/2020   VLDL 25 11/18/2020   LDLCALC 94 11/18/2020   LDLCALC 128 (H) 06/28/2020    Physical Findings: AIMS: Facial and Oral Movements Muscles of Facial Expression: None, normal Lips and Perioral Area: None, normal Jaw: None, normal Tongue: None, normal,Extremity Movements Upper (arms, wrists,  hands, fingers): None, normal Lower (legs, knees, ankles, toes): None, normal, Trunk Movements Neck, shoulders, hips: None, normal, Overall Severity Severity of abnormal movements (highest score from questions above): None, normal Incapacitation due to abnormal movements: None, normal Patient's awareness of abnormal movements (rate only patient's report): No Awareness, Dental Status Current problems with teeth and/or dentures?: No Does patient usually wear dentures?: No  CIWA:    COWS:     Musculoskeletal: Strength & Muscle Tone: within normal limits Gait & Station: normal Patient leans: N/A  Psychiatric Specialty Exam:  Presentation  General Appearance: Appropriate for Environment; Fairly Groomed  Eye Contact:Good  Speech:Clear and Coherent; Normal Rate  Speech Volume:Normal  Handedness:Right   Mood and Affect  Mood:Anxious; Depressed  Affect:Congruent   Thought Process  Thought Processes:Coherent; Goal Directed; Linear  Descriptions of Associations:Intact  Orientation:Full (Time, Place and Person)  Thought Content:Obsessions; Paranoid Ideation; Rumination  History of Schizophrenia/Schizoaffective disorder:Yes  Duration of Psychotic Symptoms:Greater than six months  Hallucinations:Hallucinations: Auditory Description of Auditory Hallucinations: clarified voices vs. racing thoughts; pt states there are no voices coming from outside his head but that his mind will sometimes turn sounds (e.g. fuzzy intercom) into sentences.  Ideas of Reference:Paranoia (Continued paranoid concerns that law enforcement will rest him or his family based on patient's past marijuana use)  Suicidal Thoughts:Suicidal Thoughts: No SI Passive Intent and/or Plan: Without Intent; Without Plan  Homicidal Thoughts:Homicidal Thoughts: No   Sensorium  Memory:Immediate Good; Recent Good; Remote Good  Judgment:Fair  Insight:Fair   Executive Functions  Concentration:Good  Attention  Span:Good  Recall:Good  Fund of Knowledge:Good  Language:Good   Psychomotor Activity  Psychomotor Activity:Psychomotor Activity: Normal   Assets  Assets:Communication Skills; Desire for Improvement; Housing; Resilience; Social Support   Sleep  Sleep:Sleep: Fair Number of Hours of Sleep: 4.75    Physical Exam: Physical Exam Vitals and nursing note reviewed.  HENT:  Head: Normocephalic and atraumatic.  Pulmonary:     Effort: Pulmonary effort is normal.  Neurological:     General: No focal deficit present.     Mental Status: He is alert and oriented to person, place, and time.    Review of Systems  Constitutional: Negative for chills, diaphoresis and fever.  Respiratory: Negative.   Cardiovascular: Negative.   Gastrointestinal: Negative for constipation, diarrhea, nausea and vomiting.  Musculoskeletal: Negative.   Neurological: Negative for tremors, seizures and headaches.       Positive for dizziness and lightheadedness upon standing  Psychiatric/Behavioral: Positive for depression and hallucinations. The patient is nervous/anxious.    Blood pressure 125/81, pulse 99, temperature 98.2 F (36.8 C), temperature source Oral, resp. rate 20, height 6' (1.829 m), weight 67.1 kg, SpO2 100 %. Body mass index is 20.07 kg/m.   Treatment Plan Summary: Daily contact with patient to assess and evaluate symptoms and progress in treatment and Medication management   Continue IVC status  Continue every 15-minute observation status.  Encouragedparticipation in group therapy and therapeutic milieu  Encouragedmovement around unit, getting out of bed, going to cafeteria for meals, attempting spending time in day room.   Psychosis -Resumeclozapine 25mg  AM/150mg  PM dosetoday. Morning dose was increased to 50 mg every morning today and patient reported feeling tired and fatigued today.  We will resume prior dosing regimen of 25 mg a.m./150 mg p.m. starting tomorrow.   Otherwise, patient appears to be tolerating clozapine well without significant side effects other than some intermittent mild orthostasis which may also be secondary to poor p.o. intake. Anticipate further upward titration as indicated/tolerated.CBC,ANC and TROPONINWNLon 01/18/21. -Weekly CBC with WBC and ANCand weekly troponinswith next draw 01/25/21. -Weekly EKG performed 01/18/2021 revealed normal sinus rhythm, ventricular rate of 89 and QT/QTc of 366/445.  Depression/anxiety -Changegabapentin to 300 mg twice daily and 400 mg nightly for anxiety -Continue Wellbutrin 75mg  daily    Constipation -Continuedocusate 200mg  twice daily -Continue MOM as needed -Continue MiraLAX daily -Push p.o. fluids -Ambulation and out of bed encouraged  Insomnia -Continue melatonin 5mg  poQHSstanding dose and 5mg  QHS prn  Wound Care -- dry dressing daily and will add neosporin to knee wounds  -- patient encouraged to attend to ADLs and shower - staff to assist -- Checking post-void bladder scan for signs of urinary retention  Disposition planning in progress.   Patient remains on [facility] transfer waitlist   Arthor Captain, MD 01/23/2021, 5:09 PM

## 2021-01-24 DIAGNOSIS — F250 Schizoaffective disorder, bipolar type: Secondary | ICD-10-CM | POA: Diagnosis not present

## 2021-01-24 LAB — CBC WITH DIFFERENTIAL/PLATELET
Abs Immature Granulocytes: 0.01 10*3/uL (ref 0.00–0.07)
Basophils Absolute: 0 10*3/uL (ref 0.0–0.1)
Basophils Relative: 1 %
Eosinophils Absolute: 0.3 10*3/uL (ref 0.0–0.5)
Eosinophils Relative: 4 %
HCT: 36.8 % — ABNORMAL LOW (ref 39.0–52.0)
Hemoglobin: 11.8 g/dL — ABNORMAL LOW (ref 13.0–17.0)
Immature Granulocytes: 0 %
Lymphocytes Relative: 25 %
Lymphs Abs: 1.5 10*3/uL (ref 0.7–4.0)
MCH: 29.8 pg (ref 26.0–34.0)
MCHC: 32.1 g/dL (ref 30.0–36.0)
MCV: 92.9 fL (ref 80.0–100.0)
Monocytes Absolute: 0.5 10*3/uL (ref 0.1–1.0)
Monocytes Relative: 8 %
Neutro Abs: 3.9 10*3/uL (ref 1.7–7.7)
Neutrophils Relative %: 62 %
Platelets: 267 10*3/uL (ref 150–400)
RBC: 3.96 MIL/uL — ABNORMAL LOW (ref 4.22–5.81)
RDW: 13.1 % (ref 11.5–15.5)
WBC: 6.2 10*3/uL (ref 4.0–10.5)
nRBC: 0 % (ref 0.0–0.2)

## 2021-01-24 MED ORDER — MELATONIN 5 MG PO TABS
10.0000 mg | ORAL_TABLET | Freq: Every day | ORAL | Status: DC
  Administered 2021-01-24 – 2021-01-28 (×5): 10 mg via ORAL
  Filled 2021-01-24 (×8): qty 2

## 2021-01-24 NOTE — Progress Notes (Signed)
Psychoeducational Group Note  Date:  01/24/2021 Time:  2325  Group Topic/Focus:  Wrap-Up Group:   The focus of this group is to help patients review their daily goal of treatment and discuss progress on daily workbooks.  Participation Level: Did Not Attend  Participation Quality:  Not Applicable  Affect:  Not Applicable  Cognitive:  Not Applicable  Insight:  Not Applicable  Engagement in Group: Not Applicable  Additional Comments:  The patient did not attend group this evening.   Hazle Coca S 01/24/2021, 11:25 PM

## 2021-01-24 NOTE — [facility] Group Notes (Signed)
Adult Psychoeducational Group Note  Date:  01/24/2021 Time:  8:47 AM  Group Topic/Focus:  Goals Group:   The focus of this group is to help patients establish daily goals to achieve during treatment and discuss how the patient can incorporate goal setting into their daily lives to aide in recovery.  Participation Level:  Active  Participation Quality:  Appropriate and Attentive  Affect:  Depressed and Flat  Cognitive:  Appropriate  Insight: Good  Engagement in Group:  Engaged  Modes of Intervention:  Discussion  Additional Comments:  Pt has a goal today of working on self care. This means taking a shower for him and socializing with his peers.   Sean Shepherd Ariel Wingrove 01/24/2021, 8:47 AM

## 2021-01-24 NOTE — Progress Notes (Signed)
[facility] MD Progress Note  01/24/2021 11:12 AM Sean Shepherd  MRN:  [id number]   Chief Complaint: AVH   Reason for admission: Sean Shepherd a 45 year old male with prior diagnosesof schizoaffectivedisorder versus schizophrenia, depression and past marijuana use who was transferred from [facility] medical unit after prolonged hospitalization for recovery from multiple significant self-inflicted injuries in response to command auditory hallucinations in a suicide attempt. The patient is currently on Hospital Day 15.   Chart Review from last 24 hours:  The patient's chart was reviewed and nursing notes were reviewed. The patient's case was discussed in multidisciplinary team meeting. Per nursing, the patient has had no behavioral issues or acute safety concerns. Per [facility] he was compliant with scheduled medications with the exception of refusing Bactroban ointment or neosporin. He did receive Melatonin X1 for sleep but no other PRNs needed.  Information Obtained Today During Patient Interview: The patient was seen and evaluated on the unit. On assessment today the patient reports that he is feeling sedated with the titration on his Clozaril. He denies issues with constipation or drooling. He states he did not sleep as long last night, but he feels the quality of his sleep is improved. We discussed potentially increasing his Melatonin which he agrees to, and we discussed that his qhs Clozaril will help with sleep. He does not report issues with orthostasis today. He reports having residual AH that he states are less pronounced and less frequent today. He states he is "able to ignore the voices better," and he states the voices are not command in nature at this time. When asked about the content of his AH, he states the voices "make comments or answer my questions." He states that sounds he hears in the room like birds chirping outside or the [facility] unit cutting on sometime morph into external voices  that "tell me not to forget what I did to myself or remind me I am a bad person." He reports having VH of seeing moving shapes in his peripheral vision. He denies first rank symptoms but does state that he avoids watching TV on the unit due to concern he will receive signals. He describes having racing thoughts but denies urges for self-harm, SI or HI. He denies feeling paranoid on the unit. He reports that his appetite is "okay" and he voices no pain related to his incisions. He continues to endorse having residual sadness and issues with low self-esteem. He had questions about the IVC court hearing and the IVC process was explained to him. He was encouraged not to spend the day in bed to avoid disruption of his sleep cycles. Time was given for questions.  Principal Problem: Schizoaffective disorder, bipolar type (HCC) Diagnosis: Principal Problem:   Schizoaffective disorder, bipolar type (HCC) Active Problems:   Insomnia   Anxiety disorder, unspecified  Total Time Spent in Direct Patient Care:  I personally spent 30 minutes on the unit in direct patient care. The direct patient care time included face-to-face time with the patient, reviewing the patient's chart, communicating with other professionals, and coordinating care. Greater than 50% of this time was spent in counseling or coordinating care with the patient regarding goals of hospitalization, psycho-education, and discharge planning needs.  Past Psychiatric History: See admission H&P  Past Medical History:  Past Medical History:  Diagnosis Date  . Anxiety disorder, unspecified 01/10/2021  . Bipolar disorder (HCC)   . Depression   . Schizoaffective disorder [facility])     Past Surgical History:  Procedure Laterality Date  . LAPAROTOMY N/A 12/25/2020   Procedure: EXPLORATORY LAPAROTOMY; REPAIR OF GROIN LACERATIONS X2, RIGHT THIGH LACERATION X 2; REPAIR RIGHT NECK AND LEFT NECK LACERATION AND REPAIR OF LEFT WRIST LACERATION;  Surgeon:  [name], MD;  Location: [facility] OR;  Service: General;  Laterality: N/A;  . NO PAST SURGERIES     Family History:see admission H&P   Family Psychiatric  History: See admission H&P  Social History:  Social History   Substance and Sexual Activity  Alcohol Use Not Currently     Social History   Substance and Sexual Activity  Drug Use Yes  . Types: Marijuana    Social History   Socioeconomic History  . Marital status: Single    Spouse name: Not on file  . Number of children: 0  . Years of education: Not on file  . Highest education level: Not on file  Occupational History  . Not on file  Tobacco Use  . Smoking status: Former Smoker    Quit date: 07/31/2014    Years since quitting: 6.4  . Smokeless tobacco: Never Used  Vaping Use  . Vaping Use: Every day  . Substances: THC, CBD  Substance and Sexual Activity  . Alcohol use: Not Currently  . Drug use: Yes    Types: Marijuana  . Sexual activity: Never  Other Topics Concern  . Not on file  Social History Narrative  . Not on file   Social Determinants of Health   Financial Resource Strain: Not on file  Food Insecurity: Not on file  Transportation Needs: Not on file  Physical Activity: Not on file  Stress: Not on file  Social Connections: Not on file   Sleep: Fair  Appetite:  Fair  Current Medications: Current Facility-Administered Medications  Medication Dose Route Frequency Provider Last Rate Last Admin  . acetaminophen (TYLENOL) tablet 650 mg  650 mg Oral Q6H PRN Lauro Franklin, MD      . alum & mag hydroxide-simeth (MAALOX/MYLANTA) 200-200-20 MG/5ML suspension 30 mL  30 mL Oral Q4H PRN Lauro Franklin, MD      . buPROPion [facility]) tablet 75 mg  75 mg Oral Daily Antonieta Pert, MD   75 mg at 01/24/21 4010  . cloZAPine (CLOZARIL) tablet 150 mg  150 mg Oral QHS Antonieta Pert, MD   150 mg at 01/23/21 2111  . cloZAPine (CLOZARIL) tablet 25 mg  25 mg Oral Daily Claudie Revering, MD    25 mg at 01/24/21 2725  . docusate sodium (COLACE) capsule 200 mg  200 mg Oral BID Claudie Revering, MD   200 mg at 01/24/21 3664  . feeding supplement (ENSURE ENLIVE / ENSURE PLUS) liquid 237 mL  237 mL Oral TID BM Lauro Franklin, MD   237 mL at 01/24/21 1000  . gabapentin (NEURONTIN) capsule 300 mg  300 mg Oral BID Claudie Revering, MD   300 mg at 01/24/21 4034  . gabapentin (NEURONTIN) capsule 400 mg  400 mg Oral QHS Claudie Revering, MD   400 mg at 01/23/21 2111  . OLANZapine zydis (ZYPREXA) disintegrating tablet 5 mg  5 mg Oral Q8H PRN Comer Locket, MD   5 mg at 01/21/21 1728   And  . LORazepam (ATIVAN) tablet 1 mg  1 mg Oral PRN Mason Jim, Kymorah Korf E, MD       And  . ziprasidone (GEODON) injection 20 mg  20 mg Intramuscular PRN Comer Locket, MD      .  magnesium hydroxide (MILK OF MAGNESIA) suspension 30 mL  30 mL Oral Daily PRN Lauro Franklin, MD      . melatonin tablet 5 mg  5 mg Oral QHS Claudie Revering, MD   5 mg at 01/23/21 2111  . melatonin tablet 5 mg  5 mg Oral QHS PRN Claudie Revering, MD   5 mg at 01/23/21 2111  . multivitamin with minerals tablet 1 tablet  1 tablet Oral Daily Lauro Franklin, MD   1 tablet at 01/24/21 [phone]  . mupirocin cream (BACTROBAN) 2 %   Topical TID Antonieta Pert, MD   Given at 01/24/21 0900  . neomycin-bacitracin-polymyxin (NEOSPORIN) ointment   Topical BID Comer Locket, MD   Given at 01/21/21 2130  . neomycin-bacitracin-polymyxin (NEOSPORIN) ointment   Topical TID Antonieta Pert, MD   Given at 01/22/21 1258  . polyethylene glycol (MIRALAX / GLYCOLAX) packet 17 g  17 g Oral Daily Lauro Franklin, MD   17 g at 01/24/21 0827    Lab Results:  Results for orders placed or performed during the hospital encounter of 01/09/21 (from the past 48 hour(s))  CBC with Differential/Platelet     Status: Abnormal   Collection Time: 01/24/21  6:40 AM  Result Value Ref Range   WBC 6.2 4.0 - 10.5 K/uL   RBC 3.96 (L) 4.22 - 5.81  MIL/uL   Hemoglobin 11.8 (L) 13.0 - 17.0 g/dL   HCT 75.1 (L) 70.0 - 17.4 %   MCV 92.9 80.0 - 100.0 fL   MCH 29.8 26.0 - 34.0 pg   MCHC 32.1 30.0 - 36.0 g/dL   RDW 94.4 96.7 - 59.1 %   Platelets 267 150 - 400 K/uL   nRBC 0.0 0.0 - 0.2 %   Neutrophils Relative % 62 %   Neutro Abs 3.9 1.7 - 7.7 K/uL   Lymphocytes Relative 25 %   Lymphs Abs 1.5 0.7 - 4.0 K/uL   Monocytes Relative 8 %   Monocytes Absolute 0.5 0.1 - 1.0 K/uL   Eosinophils Relative 4 %   Eosinophils Absolute 0.3 0.0 - 0.5 K/uL   Basophils Relative 1 %   Basophils Absolute 0.0 0.0 - 0.1 K/uL   Immature Granulocytes 0 %   Abs Immature Granulocytes 0.01 0.00 - 0.07 K/uL    Comment: Performed at [facility], [address] [place], [place] [address]    Blood Alcohol level:  Lab Results  Component Value Date   ETH <10 12/25/2020   ETH <10 11/18/2020    Metabolic Disorder Labs: Lab Results  Component Value Date   HGBA1C 5.2 01/09/2021   MPG 102.54 01/09/2021   MPG 117 06/28/2020   No results found for: PROLACTIN Lab Results  Component Value Date   CHOL 188 11/18/2020   TRIG 124 11/18/2020   HDL 69 11/18/2020   CHOLHDL 2.7 11/18/2020   VLDL 25 11/18/2020   LDLCALC 94 11/18/2020   LDLCALC 128 (H) 06/28/2020    Physical Findings: AIMS: Facial and Oral Movements Muscles of Facial Expression: None, normal Lips and Perioral Area: None, normal Jaw: None, normal Tongue: None, normal,Extremity Movements Upper (arms, wrists, hands, fingers): None, normal Lower (legs, knees, ankles, toes): None, normal, Trunk Movements Neck, shoulders, hips: None, normal, Overall Severity Severity of abnormal movements (highest score from questions above): None, normal Incapacitation due to abnormal movements: None, normal Patient's awareness of abnormal movements (rate only patient's report): No Awareness, Dental Status Current problems with  teeth and/or dentures?: No Does patient usually wear dentures?:  No     Musculoskeletal: Strength & Muscle Tone: within normal limits Gait & Station: untested, resting in bed Patient leans: N/A  Psychiatric Specialty Exam: Physical Exam Vitals and nursing note reviewed.  HENT:     Head: Normocephalic and atraumatic.  Pulmonary:     Effort: Pulmonary effort is normal.  Neurological:     General: No focal deficit present.     Mental Status: He is alert.      Review of Systems  Constitutional: Negative for fever.  Respiratory: Negative for shortness of breath.   Cardiovascular: Negative for chest pain.  Gastrointestinal: Negative for constipation, diarrhea, nausea and vomiting.  Neurological: Negative for headaches.  Psychiatric/Behavioral: Positive for depression and hallucinations. The patient is nervous/anxious.     Blood pressure (!) 93/58, pulse 96, temperature 97.6 F (36.4 C), temperature source Oral, resp. rate 20, height 6' (1.829 m), weight 67.6 kg, SpO2 98 %.Body mass index is 20.21 kg/m.  General Appearance: casually dressed, fair hygiene  Eye Contact:  Good  Speech:  Clear and Coherent and Normal Rate  Volume:  Decreased  Mood:  Anxious and Dysphoric  Affect:  Constricted  Thought Process:  Goal Directed and Linear but ruminative about IVC process  Orientation:  Full (Time, Place, and Person)  Thought Content: Endorses AH that are non-command in nature and has thoughts that voices are morphing out of sounds externally in the room; endorses ideas of reference from TV but denies first rank symptoms; is guarded on exam but denies paranoia; is not grossly responding to internal/external stimuli on exam  Suicidal Thoughts:  No  Homicidal Thoughts:  No  Memory:  Recent;   Good  Judgement:  Fair  Insight:  Fair  Psychomotor Activity:  Normal  Concentration:  Concentration: Fair and Attention Span: Fair  Recall:  Good  Fund of Knowledge:  Good  Language:  Good  Akathisia:  Negative  Assets:  Desire for Improvement Resilience   ADL's:  Intact  Cognition:  WNL  Sleep:  Number of Hours: 6.25   Treatment Plan Summary: Daily contact with patient to assess and evaluate symptoms and progress in treatment and Medication management   Continue IVC status  Continue every 15-minute observation status.  Encouragedparticipation in group therapy and therapeutic milieu  Encouragedmovement around unit, getting out of bed, going to cafeteria for meals, attempting spending time in day room.   Psychosis -Continueclozapine 25mg  AM/150mg  PM dose. Otherwise, patient appears to be tolerating clozapine well without significant side effects other than some intermittent mild orthostasis which may also be secondary to poor p.o. intake. Anticipate further upward titration as indicated/tolerated.CBC,ANC and TROPONINWNLon 01/18/21. -Weekly CBC with WBC and ANCand weekly troponinswith next draw 01/25/21. -Weekly EKG performed 01/18/2021 revealed normal sinus rhythm, ventricular rate of 89 and QT/QTc of 366/445.  Depression/anxiety -Continuegabapentin 300 mg twice daily and 400 mg nightly for anxiety -Continue Wellbutrin 75mg  daily   Constipation -Continuedocusate 200mg  twice daily -Continue MOM as needed -Continue MiraLAX daily -Push p.o. fluids -Ambulation and out of bed encouraged  Insomnia -Increase melatonin to 10mg  poQHSstanding dose   Wound Care -- dry dressing daily   -- patient encouraged to attend to ADLs and shower   Disposition planning in progress.   [facility]   Comer LocketAmy E Roselyn Doby, MD, FAPA 01/24/2021, 11:12 AM

## 2021-01-24 NOTE — B[facility] Group Notes (Signed)
Type of Therapy and Topic:  Group Therapy - Healthy vs Unhealthy Coping Skills  Participation Level:  Did not attend   Description of Group The focus of this group was to determine what unhealthy coping techniques typically are used by group members and what healthy coping techniques would be helpful in coping with various problems. Patients were guided in becoming aware of the differences between healthy and unhealthy coping techniques. Patients were asked to identify 2-3 healthy coping skills they would like to learn to use more effectively.  Therapeutic Goals 1. Patients learned that coping is what human beings do all day long to deal with various situations in their lives 2. Patients defined and discussed healthy vs unhealthy coping techniques 3. Patients identified their preferred coping techniques and identified whether these were healthy or unhealthy 4. Patients determined 2-3 healthy coping skills they would like to become more familiar with and use more often. 5. Patients provided support and ideas to each other   Summary of Patient Progress:  Did not attend 

## 2021-01-24 NOTE — Progress Notes (Signed)
Recreation Therapy Notes  Date: 5.25.22 Time: 0930 Location: [address] Dayroom  Group Topic: Stress Management   Goal Area(s) Addresses:  Patient will actively participate in stress management techniques presented during session.  Patient will successfully identify benefit of practicing stress management post d/c.   Intervention: Guided exercise with ambient sound and script  Activity :Guided Imagery  LRT read a script that focused enjoying the quiet calm of being at the beach listening to the waves.  Patients were to follow along as script was read and the sound of the waves playing in the background, patients were asked to visualize themselves in that space.  Patients were given suggestions of ways to access scripts post d/c and encouraged to explore Youtube and other apps available on smartphones, tablets, and computers.   Education:  Stress Management, Discharge Planning.   Education Outcome: Acknowledges education  Clinical Observations/Feedback: Patient did not attend group.    Caroll Rancher, LRT/CTRS         Caroll Rancher A 01/24/2021 12:19 PM

## 2021-01-24 NOTE — BHH Group Notes (Signed)
Adult Psychoeducational Group Note  Date:  01/24/2021 Time:  3:10 PM  Group Topic/Focus:  Overcoming Stress:   The focus of this group is to define stress and help patients assess their triggers.  Participation Level:  Did Not Attend Pt was in the bed resting at this time.  Sean Shepherd Sean Shepherd 01/24/2021, 3:10 PM

## 2021-01-24 NOTE — Progress Notes (Signed)
D:  Patient's self inventory sheet, patient has poor sleep, no sleep medication.  Good appetite, low energy level, poor concentration.  Rated depression 7, hopeless and anxiety 8.  Denied withdrawals.  Denied SI.  Physical problems, dizzy.  Physical pain, stomach.  Pain medicine is helpful.  Goal is shower and spend time in dayroom, self care.  No discharge plans. A:  Emotional support and encouragement given patient. R:  Safety maintained with 15 minute checks.

## 2021-01-24 NOTE — Progress Notes (Addendum)
   01/23/21 2000  Psych Admission Type (Psych Patients Only)  Admission Status Voluntary  Psychosocial Assessment  Patient Complaints Anxiety;Depression  Eye Contact Brief  Facial Expression Anxious;Worried  Affect Anxious;Appropriate to circumstance;Depressed  Speech Logical/coherent  Interaction Assertive  Motor Activity Slow  Appearance/Hygiene Unremarkable  Behavior Characteristics Cooperative;Anxious  Mood Anxious;Depressed;Pleasant  Thought Process  Coherency WDL  Content Blaming self  Delusions None reported or observed  Perception Hallucinations  Hallucination Auditory (non-command, lower and just chatter)  Judgment Poor  Confusion None  Danger to Self  Current suicidal ideation? Denies  Danger to Others  Danger to Others None reported or observed   Pt denies SI, HI, and pain. Pt endorses AH but says the voices are not as loud and are just chatter today. Pt says the daytime Clozaril is making him very tired. Did not attend any groups today. Pt informed that the dose will be reduced. Pt does not want dressing changes. Pt says he is full from the earlier nutritional supplements and refused the one this evening. Pt did get to shave today. Still increased anxiety and depression r/t his self-inflicted mutilation and how his future will be because of it.

## 2021-01-24 NOTE — Progress Notes (Addendum)
Pt presents with depressed mood, flat affect. Pt approached and was found napping in his bed. He continues to present disheleved, with depressed mood and flat affect. He states to Clinical research associate '' Well I think I'm getting better but yes I still hear the voices, they are still telling me horrible things that I'm bad, or I'm going to hell'' Pt shares the voices are never positive in nature. He also reports he continues to lack volition and hasn't showered in days. I have encouraged the patient to shower, and also requested he do so so that I may dress his wounds. He verbalized understanding. He also reports he still has some visual hallucinations '' things aren't stationary that are supposed to be, the light is moving'' He is pale, and thin, and this writer has encouraged po intake as he does report poor po intake and dizziness upon standing. Pressures are soft but trending in same range. Pt given ensure. He is able to contact for safety, denies any thoughts of self harm. Pt is safe, will con't to monitor for safety.  At 1350 pt dressing changed after pt showed. His mid abdominal wound is approximated, and completely healed, no redness or irritation noted and it is healed nicely. There is one small scabbed area that is closing, no draining. At this point pt wound appears it can be left open to air. NP in to assess and agrees. Will discuss with MD. Pt expressed remorse for '' just all the damage to my body what I've done. '' Allowed him to ventilate and support given.

## 2021-01-25 LAB — CBC WITH DIFFERENTIAL/PLATELET
Abs Immature Granulocytes: 0.04 10*3/uL (ref 0.00–0.07)
Basophils Absolute: 0 10*3/uL (ref 0.0–0.1)
Basophils Relative: 0 %
Eosinophils Absolute: 0.1 10*3/uL (ref 0.0–0.5)
Eosinophils Relative: 1 %
HCT: 38.2 % — ABNORMAL LOW (ref 39.0–52.0)
Hemoglobin: 12.4 g/dL — ABNORMAL LOW (ref 13.0–17.0)
Immature Granulocytes: 0 %
Lymphocytes Relative: 9 %
Lymphs Abs: 1.1 10*3/uL (ref 0.7–4.0)
MCH: 29.5 pg (ref 26.0–34.0)
MCHC: 32.5 g/dL (ref 30.0–36.0)
MCV: 91 fL (ref 80.0–100.0)
Monocytes Absolute: 0.8 10*3/uL (ref 0.1–1.0)
Monocytes Relative: 7 %
Neutro Abs: 9.8 10*3/uL — ABNORMAL HIGH (ref 1.7–7.7)
Neutrophils Relative %: 83 %
Platelets: 275 10*3/uL (ref 150–400)
RBC: 4.2 MIL/uL — ABNORMAL LOW (ref 4.22–5.81)
RDW: 12.9 % (ref 11.5–15.5)
WBC: 11.8 10*3/uL — ABNORMAL HIGH (ref 4.0–10.5)
nRBC: 0 % (ref 0.0–0.2)

## 2021-01-25 LAB — TSH: TSH: 0.828 u[IU]/mL (ref 0.350–4.500)

## 2021-01-25 LAB — TROPONIN I (HIGH SENSITIVITY): Troponin I (High Sensitivity): 9 ng/L (ref <18)

## 2021-01-25 NOTE — Progress Notes (Addendum)
   01/25/21 2015  Psych Admission Type (Psych Patients Only)  Admission Status Voluntary  Psychosocial Assessment  Patient Complaints Anxiety;Depression  Eye Contact Brief  Facial Expression Anxious;Worried  Affect Anxious;Appropriate to circumstance;Depressed  Speech Logical/coherent  Interaction Assertive  Motor Activity Slow  Appearance/Hygiene Unremarkable  Behavior Characteristics Cooperative;Anxious  Mood Depressed;Anxious  Thought Process  Coherency WDL  Content Blaming self  Delusions None reported or observed  Perception WDL  Hallucination Auditory (chatter; quieter)  Judgment Poor  Confusion None  Danger to Self  Current suicidal ideation? Denies  Danger to Others  Danger to Others None reported or observed   Pt denies SI, HI and pain. Pt endorses AH as chatter today that has quieted. Pt went outside, is ambulating more and seen in dayroom. Pt still ruminating about his future after the self-mutilation. Pt rates anxiety 4/10 and depression 4/10. Pt encouraged to drink fluids and his nutritional shakes to keep his weight up and to avoid the orthostatic hypotension he seems to be experiencing almost every morning.

## 2021-01-25 NOTE — Progress Notes (Signed)
DAR NOTE: Pt present with flat affect and depressed mood in the unit. Pt has been isolating himself and has been bed most of the time. Pt denies physical pain, took all his meds as scheduled.  Pt's safety ensured with 15 minute and environmental checks. Pt currently denies SI/HI and A/V hallucinations. Pt verbally agrees to seek staff if SI/HI or A/VH occurs and to consult with staff before acting on these thoughts. Will continue POC.  

## 2021-01-25 NOTE — Progress Notes (Signed)
[facility] MD Progress Note  01/25/2021 3:52 PM Sean Shepherd  MRN:  [id number]   Reason for admission: Sean Reiseris a 45 year old male with prior diagnosesof schizoaffectivedisorder versus schizophrenia, depression and past marijuana use who was transferred from [facility] medical unit after prolonged hospitalization for recovery from multiple significant self-inflicted injuries in response to command auditory hallucinations in a suicide attempt.  Objective: Medical record reviewed.  Case discussed in detail with members of the treatment team.  I met with and evaluated the patient on the unit today for follow-up.  The patient is asleep but awakens easily when I enter the room.  He is cooperative and appropriately engaged in conversation with me.  He reports that his auditory hallucinations have decreased over the last couple of days.  He denies that they are command to harm himself.  They still make negative statements him (e.g., that he is going to hell).  His sleep has improved.  Patient reports that he experiences some early insomnia but then sleeps okay after that.  He denies suicidal thoughts but does endorse passive wishes not to be alive.  He continues to experience anxiety about being around other people and spends most of his time in his room.  He reports daytime fatigue, some lightheadedness when going from sitting to standing in the mornings, and sialorrhea at night denies other medication side effects.  She acknowledges that he has been sleeping during the day to past the time.  He expresses some paranoia about having been served with IVC paperwork by the police and we discussed that this is typical and not indication of a criminal matter.  I discussed with him the plan to reduce his morning dose of clozapine to see if it decreases his daytime fatigue.  I encouraged him to be out of bed to groups and meals and not spend time sleeping during the day since disrupted sleep-wake cycle  will make it more difficult for his mood symptoms to improve.  The patient slept 6.75 hours last night.  Vital signs this morning include BP of 119/87 sitting and 95/65 standing, pulse of 102 sitting and 106 standing, temperature of 98.5.  Labs today include WBC of 11.8, RBC of 4.2, hemoglobin of 12.4, hematocrit of 38.2 and otherwise WNL.  Differential revealed neutrophil count of 9800 and otherwise WNL.  Troponin was WNL at 9.  TSH was 0.828.  EKG today revealed normal sinus rhythm with ventricular rate of 97 and QT/QTc of 356/452.  Principal Problem: Schizoaffective disorder, bipolar type ([place]) Diagnosis: Principal Problem:   Schizoaffective disorder, bipolar type ([place]) Active Problems:   Insomnia   Anxiety disorder, unspecified  Total Time spent with patient: 25 minutes  Past Psychiatric History: See admission H&P  Past Medical History:  Past Medical History:  Diagnosis Date  . Anxiety disorder, unspecified 01/10/2021  . Bipolar disorder ([place])   . Depression   . Schizoaffective disorder [facility])     Past Surgical History:  Procedure Laterality Date  . LAPAROTOMY N/A 12/25/2020   Procedure: EXPLORATORY LAPAROTOMY; REPAIR OF GROIN LACERATIONS X2, RIGHT THIGH LACERATION X 2; REPAIR RIGHT NECK AND LEFT NECK LACERATION AND REPAIR OF LEFT WRIST LACERATION;  Surgeon: Georganna Skeans, MD;  Location: [place];  Service: General;  Laterality: N/A;  . NO PAST SURGERIES     Family History: History reviewed. No pertinent family history. Family Psychiatric  History: See admission H&P Social History:  Social History   Substance and Sexual Activity  Alcohol Use Not Currently  Social History   Substance and Sexual Activity  Drug Use Yes  . Types: Marijuana    Social History   Socioeconomic History  . Marital status: Single    Spouse name: Not on file  . Number of children: 0  . Years of education: Not on file  . Highest education level: Not on file  Occupational History  . Not on file   Tobacco Use  . Smoking status: Former Smoker    Quit date: 07/31/2014    Years since quitting: 6.4  . Smokeless tobacco: Never Used  Vaping Use  . Vaping Use: Every day  . Substances: THC, CBD  Substance and Sexual Activity  . Alcohol use: Not Currently  . Drug use: Yes    Types: Marijuana  . Sexual activity: Never  Other Topics Concern  . Not on file  Social History Narrative  . Not on file   Social Determinants of Health   Financial Resource Strain: Not on file  Food Insecurity: Not on file  Transportation Needs: Not on file  Physical Activity: Not on file  Stress: Not on file  Social Connections: Not on file   Additional Social History:                         Sleep: Good  Appetite:  Fair  Current Medications: Current Facility-Administered Medications  Medication Dose Route Frequency Provider Last Rate Last Admin  . acetaminophen (TYLENOL) tablet 650 mg  650 mg Oral Q6H PRN Briant Cedar, MD      . alum & mag hydroxide-simeth (MAALOX/MYLANTA) 200-200-20 MG/5ML suspension 30 mL  30 mL Oral Q4H PRN Briant Cedar, MD      . buPROPion [facility]) tablet 75 mg  75 mg Oral Daily Sharma Covert, MD   75 mg at 01/25/21 3893  . cloZAPine (CLOZARIL) tablet 150 mg  150 mg Oral QHS Sharma Covert, MD   150 mg at 01/24/21 2102  . cloZAPine (CLOZARIL) tablet 25 mg  25 mg Oral Daily Arthor Captain, MD   25 mg at 01/25/21 0813  . docusate sodium (COLACE) capsule 200 mg  200 mg Oral BID Arthor Captain, MD   200 mg at 01/25/21 7342  . feeding supplement (ENSURE ENLIVE / ENSURE PLUS) liquid 237 mL  237 mL Oral TID BM Briant Cedar, MD   237 mL at 01/25/21 1413  . gabapentin (NEURONTIN) capsule 400 mg  400 mg Oral QHS Arthor Captain, MD   400 mg at 01/24/21 2100  . OLANZapine zydis (ZYPREXA) disintegrating tablet 5 mg  5 mg Oral Q8H PRN Harlow Asa, MD   5 mg at 01/21/21 1728   And  . LORazepam (ATIVAN) tablet 1 mg  1 mg Oral PRN  Nelda Marseille, Amy E, MD       And  . ziprasidone (GEODON) injection 20 mg  20 mg Intramuscular PRN Nelda Marseille, Amy E, MD      . magnesium hydroxide (MILK OF MAGNESIA) suspension 30 mL  30 mL Oral Daily PRN Briant Cedar, MD      . melatonin tablet 10 mg  10 mg Oral QHS Harlow Asa, MD   10 mg at 01/24/21 2101  . multivitamin with minerals tablet 1 tablet  1 tablet Oral Daily Briant Cedar, MD   1 tablet at 01/25/21 0813  . mupirocin cream (BACTROBAN) 2 %   Topical TID Sharma Covert, MD  1 application at 16/10/96 1632  . neomycin-bacitracin-polymyxin (NEOSPORIN) ointment   Topical BID Harlow Asa, MD   Given at 01/21/21 2130  . polyethylene glycol (MIRALAX / GLYCOLAX) packet 17 g  17 g Oral Daily Briant Cedar, MD   17 g at 01/25/21 0454    Lab Results:  Results for orders placed or performed during the hospital encounter of 01/09/21 (from the past 48 hour(s))  CBC with Differential/Platelet     Status: Abnormal   Collection Time: 01/24/21  6:40 AM  Result Value Ref Range   WBC 6.2 4.0 - 10.5 K/uL   RBC 3.96 (L) 4.22 - 5.81 MIL/uL   Hemoglobin 11.8 (L) 13.0 - 17.0 g/dL   HCT 36.8 (L) 39.0 - 52.0 %   MCV 92.9 80.0 - 100.0 fL   MCH 29.8 26.0 - 34.0 pg   MCHC 32.1 30.0 - 36.0 g/dL   RDW 13.1 11.5 - 15.5 %   Platelets 267 150 - 400 K/uL   nRBC 0.0 0.0 - 0.2 %   Neutrophils Relative % 62 %   Neutro Abs 3.9 1.7 - 7.7 K/uL   Lymphocytes Relative 25 %   Lymphs Abs 1.5 0.7 - 4.0 K/uL   Monocytes Relative 8 %   Monocytes Absolute 0.5 0.1 - 1.0 K/uL   Eosinophils Relative 4 %   Eosinophils Absolute 0.3 0.0 - 0.5 K/uL   Basophils Relative 1 %   Basophils Absolute 0.0 0.0 - 0.1 K/uL   Immature Granulocytes 0 %   Abs Immature Granulocytes 0.01 0.00 - 0.07 K/uL    Comment: Performed at [facility], [place] [address], [place], [place] [address]  CBC with Differential/Platelet     Status: Abnormal   Collection Time: 01/25/21  6:50 AM  Result  Value Ref Range   WBC 11.8 (H) 4.0 - 10.5 K/uL   RBC 4.20 (L) 4.22 - 5.81 MIL/uL   Hemoglobin 12.4 (L) 13.0 - 17.0 g/dL   HCT 38.2 (L) 39.0 - 52.0 %   MCV 91.0 80.0 - 100.0 fL   MCH 29.5 26.0 - 34.0 pg   MCHC 32.5 30.0 - 36.0 g/dL   RDW 12.9 11.5 - 15.5 %   Platelets 275 150 - 400 K/uL   nRBC 0.0 0.0 - 0.2 %   Neutrophils Relative % 83 %   Neutro Abs 9.8 (H) 1.7 - 7.7 K/uL   Lymphocytes Relative 9 %   Lymphs Abs 1.1 0.7 - 4.0 K/uL   Monocytes Relative 7 %   Monocytes Absolute 0.8 0.1 - 1.0 K/uL   Eosinophils Relative 1 %   Eosinophils Absolute 0.1 0.0 - 0.5 K/uL   Basophils Relative 0 %   Basophils Absolute 0.0 0.0 - 0.1 K/uL   Immature Granulocytes 0 %   Abs Immature Granulocytes 0.04 0.00 - 0.07 K/uL    Comment: Performed at [facility], [place] [address] [place], [place] [address]  TSH     Status: None   Collection Time: 01/25/21  6:50 AM  Result Value Ref Range   TSH 0.828 0.350 - 4.500 uIU/mL    Comment: Performed by a 3rd Generation assay with a functional sensitivity of <=0.01 uIU/mL. Performed at [facility], [place] [address] [place], [place] [address]   Troponin I (High Sensitivity)     Status: None   Collection Time: 01/25/21  6:50 AM  Result Value Ref Range   Troponin I (High Sensitivity) 9 <18 ng/L  Comment: (NOTE) Elevated high sensitivity troponin I (hsTnI) values and significant  changes across serial measurements may suggest ACS but many other  chronic and acute conditions are known to elevate hsTnI results.  Refer to the "Links" section for chest pain algorithms and additional  guidance. Performed at [facility], [place] [address] [place], [place] [address]     Blood Alcohol level:  Lab Results  Component Value Date   ETH <10 12/25/2020   ETH <10 54/27/0623    Metabolic Disorder Labs: Lab Results  Component Value Date   HGBA1C 5.2 01/09/2021   MPG 102.54 01/09/2021   MPG 117 06/28/2020    No results found for: PROLACTIN Lab Results  Component Value Date   CHOL 188 11/18/2020   TRIG 124 11/18/2020   HDL 69 11/18/2020   CHOLHDL 2.7 11/18/2020   VLDL 25 11/18/2020   LDLCALC 94 11/18/2020   LDLCALC 128 (H) 06/28/2020    Physical Findings: AIMS: Facial and Oral Movements Muscles of Facial Expression: None, normal Lips and Perioral Area: None, normal Jaw: None, normal Tongue: None, normal,Extremity Movements Upper (arms, wrists, hands, fingers): None, normal Lower (legs, knees, ankles, toes): None, normal, Trunk Movements Neck, shoulders, hips: None, normal, Overall Severity Severity of abnormal movements (highest score from questions above): None, normal Incapacitation due to abnormal movements: None, normal Patient's awareness of abnormal movements (rate only patient's report): No Awareness, Dental Status Current problems with teeth and/or dentures?: No Does patient usually wear dentures?: No  CIWA:    COWS:     Musculoskeletal: Strength & Muscle Tone: within normal limits Gait & Station: normal Patient leans: N/A  Psychiatric Specialty Exam:  Presentation  General Appearance: Appropriate for Environment; Casual  Eye Contact:Good  Speech:Clear and Coherent; Normal Rate  Speech Volume:Normal  Handedness:Right   Mood and Affect  Mood:Anxious  Affect:Congruent   Thought Process  Thought Processes:Coherent; Goal Directed  Descriptions of Associations:Intact  Orientation:Full (Time, Place and Person)  Thought Content:Obsessions; Paranoid Ideation; Rumination  History of Schizophrenia/Schizoaffective disorder:Yes  Duration of Psychotic Symptoms:Greater than six months  Hallucinations:Hallucinations: Auditory  Ideas of Reference:None  Suicidal Thoughts:Suicidal Thoughts: Yes, Passive SI Passive Intent and/or Plan: Without Intent; Without Plan  Homicidal Thoughts:Homicidal Thoughts: No   Sensorium  Memory:Immediate Good; Recent  Good; Remote Good  Judgment:Fair  Insight:Fair   Executive Functions  Concentration:Good  Attention Span:Good  Recall:Good  Fund of Knowledge:Good  Language:Good   Psychomotor Activity  Psychomotor Activity:Psychomotor Activity: Normal   Assets  Assets:Communication Skills; Desire for Improvement; Housing; Resilience; Social Support   Sleep  Sleep:Sleep: Good Number of Hours of Sleep: 6.75    Physical Exam: Physical Exam Vitals and nursing note reviewed.  HENT:     Head: Normocephalic and atraumatic.  Pulmonary:     Effort: Pulmonary effort is normal.  Neurological:     General: No focal deficit present.     Mental Status: He is alert and oriented to person, place, and time.    Review of Systems  Constitutional: Negative for chills, diaphoresis and fever.  Respiratory: Negative.   Cardiovascular: Negative.   Gastrointestinal: Negative for constipation, diarrhea, nausea and vomiting.  Musculoskeletal: Negative.   Neurological: Negative for tremors, seizures and headaches.       Positive for dizziness and lightheadedness upon standing  Psychiatric/Behavioral: Positive for depression and hallucinations. The patient is nervous/anxious.    Blood pressure 95/65, pulse (!) 106, temperature 98.5 F (36.9 C), temperature source Oral, resp. rate 18, height 6' (1.829 m),  weight 66.2 kg, SpO2 97 %. Body mass index is 19.8 kg/m.   Treatment Plan Summary: Daily contact with patient to assess and evaluate symptoms and progress in treatment and Medication management   Continue IVC status  Continue every 15-minute observation status.  Encouragedparticipation in group therapy and therapeutic milieu  Encouragedmovement around unit, getting out of bed, going to cafeteria for meals, attempting spending time in day room.   Psychosis -Continueclozapine 20m AM/1518mPM. Anticipate further upward titration as indicated/tolerated.CBC,ANC and TROPONINWNLon  01/25/21. -Weekly CBC with WBC and ANCand weekly troponinswith next draw 02/01/21. -Weekly EKG performed 01/18/2021 revealed normal sinus rhythm, ventricular rate of 97 and QT/QTc of 356/452.  Depression/anxiety -Discontinue gabapentin to 300 mg twice daily doses. -Continue gabapentin 400 mg nightly for anxiety -Continue Wellbutrin 7562maily    Constipation -Continuedocusate 200m10mice daily -Continue MOM as needed -Continue MiraLAX daily -Push p.o. fluids -Ambulation and out of bed encouraged  Insomnia -Continue melatonin 10mg29mHSstanding dose   Wound Care -- dry dressing daily   -- patient encouraged to attend to ADLs and shower   Mildly elevated white count -Possibly secondary to dehydration -Repeat CBC and differential with morning draw 01/26/2021 -Send urinalysis  Disposition planning in progress.   [facility]   MarthArthor Captain5/26/2022, 3:52 PM

## 2021-01-25 NOTE — BHH Counselor (Signed)
CSW spoke with Mrs. Ewen (Mother) and provided her with an update about her son.  CSW spoke with Mrs. Youngberg about her son isolating and she states that he was never close with people outside of the family because "he had 6 siblings and that relationship was already built in without a lot of trying".  Mrs. Schwinn states that she would like her son to be more active on the unit and out of bed more often.  She states that when she last spoke to him on Sunday he stated that the idea of having a roommate was causing him some Anxiety and he was not sure how he was going to deal with that.  She states that she will also be encouraging him to go to groups and not to isolate so much.  Mrs. Gervin states that he also told her on Sunday that the voices were telling him that he could not have desert because it is something that is pleasurable and she states that he has often denied himself anything that brings pleasure or joy.  She states that he will not seek things that he enjoys but if it is brought to him and placed in front of him (example: candy) he will take it and eat it.  Mrs. Maalouf would like to know if there is any way that the doctor would allow her to bring him some candy and some books or newspaper.   CSW stated that she will ask the doctor about this.   CSW completed the update with Mrs. Siek.

## 2021-01-25 NOTE — Progress Notes (Addendum)
   01/25/21 0609  Vital Signs  Temp 98.5 F (36.9 C)  Temp Source Oral  Pulse Rate (!) 102  BP 119/87  BP Location Left Arm  BP Method Automatic  Patient Position (if appropriate) Sitting  Oxygen Therapy  SpO2 97 %   D: Patient denies SI/H/AVH. Patient rated both anxiety and depression 8/10. Pt did to go to the cafeteria for lunch and went to groups inside and outside. Pt. Talked on the phone. Pt. Talked about authors he liked, like Eliezer Lofts, and Kandace Blitz. Pt. Stated that he used to like to golf.  A:  Patient took scheduled medicine.  Support and encouragement provided Routine safety checks conducted every 15 minutes. Patient  Informed to notify staff with any concerns.   R:  Safety maintained.  Late note 1858 Pt.'s dressings were changed on knees and chest. Triple ABX ointment and band aides to knees applied. Bactroban ointment, Telfa, and paper tape applied to chest, some bloody drainage at the distal end by groin.

## 2021-01-26 LAB — CBC WITH DIFFERENTIAL/PLATELET
Abs Immature Granulocytes: 0.01 10*3/uL (ref 0.00–0.07)
Basophils Absolute: 0 10*3/uL (ref 0.0–0.1)
Basophils Relative: 1 %
Eosinophils Absolute: 0.2 10*3/uL (ref 0.0–0.5)
Eosinophils Relative: 3 %
HCT: 36.7 % — ABNORMAL LOW (ref 39.0–52.0)
Hemoglobin: 11.7 g/dL — ABNORMAL LOW (ref 13.0–17.0)
Immature Granulocytes: 0 %
Lymphocytes Relative: 23 %
Lymphs Abs: 1.4 10*3/uL (ref 0.7–4.0)
MCH: 29 pg (ref 26.0–34.0)
MCHC: 31.9 g/dL (ref 30.0–36.0)
MCV: 90.8 fL (ref 80.0–100.0)
Monocytes Absolute: 0.5 10*3/uL (ref 0.1–1.0)
Monocytes Relative: 8 %
Neutro Abs: 3.9 10*3/uL (ref 1.7–7.7)
Neutrophils Relative %: 65 %
Platelets: 248 10*3/uL (ref 150–400)
RBC: 4.04 MIL/uL — ABNORMAL LOW (ref 4.22–5.81)
RDW: 12.9 % (ref 11.5–15.5)
WBC: 6 10*3/uL (ref 4.0–10.5)
nRBC: 0 % (ref 0.0–0.2)

## 2021-01-26 LAB — URINALYSIS, COMPLETE (UACMP) WITH MICROSCOPIC
Bilirubin Urine: NEGATIVE
Glucose, UA: NEGATIVE mg/dL
Hgb urine dipstick: NEGATIVE
Ketones, ur: NEGATIVE mg/dL
Nitrite: NEGATIVE
Protein, ur: NEGATIVE mg/dL
Specific Gravity, Urine: 1.005 (ref 1.005–1.030)
pH: 7 (ref 5.0–8.0)

## 2021-01-26 LAB — COMPREHENSIVE METABOLIC PANEL
ALT: 18 U/L (ref 0–44)
AST: 13 U/L — ABNORMAL LOW (ref 15–41)
Albumin: 3.8 g/dL (ref 3.5–5.0)
Alkaline Phosphatase: 70 U/L (ref 38–126)
Anion gap: 9 (ref 5–15)
BUN: 18 mg/dL (ref 6–20)
CO2: 27 mmol/L (ref 22–32)
Calcium: 9.4 mg/dL (ref 8.9–10.3)
Chloride: 102 mmol/L (ref 98–111)
Creatinine, Ser: 0.72 mg/dL (ref 0.61–1.24)
GFR, Estimated: >60 mL/min (ref >60)
Glucose, Bld: 127 mg/dL — ABNORMAL HIGH (ref 70–99)
Potassium: 3.7 mmol/L (ref 3.5–5.1)
Sodium: 138 mmol/L (ref 135–145)
Total Bilirubin: 0.6 mg/dL (ref 0.3–1.2)
Total Protein: 7 g/dL (ref 6.5–8.1)

## 2021-01-26 MED ORDER — CLOZAPINE 25 MG PO TABS
175.0000 mg | ORAL_TABLET | Freq: Every day | ORAL | Status: DC
  Administered 2021-01-26 – 2021-01-31 (×6): 175 mg via ORAL
  Filled 2021-01-26 (×8): qty 3

## 2021-01-26 MED ORDER — CLOZAPINE 25 MG PO TABS: 175.0000 mg | ORAL_TABLET | Freq: Every day | ORAL | Status: DC

## 2021-01-26 MED ORDER — BUPROPION HCL ER (SR) 100 MG PO TB12
100.0000 mg | ORAL_TABLET | Freq: Every day | ORAL | Status: DC
  Administered 2021-01-26 – 2021-02-04 (×10): 100 mg via ORAL
  Filled 2021-01-26 (×8): qty 1
  Filled 2021-01-26: qty 10
  Filled 2021-01-26 (×4): qty 1

## 2021-01-26 MED ORDER — GABAPENTIN 300 MG PO CAPS
300.0000 mg | ORAL_CAPSULE | Freq: Every day | ORAL | Status: DC
  Administered 2021-01-26 – 2021-01-27 (×2): 300 mg via ORAL
  Filled 2021-01-26 (×3): qty 1

## 2021-01-26 NOTE — Progress Notes (Addendum)
[facility] MD Progress Note  01/26/2021 12:51 PM Sean Shepherd  MRN:  [id number]   Reason for admission: Sean Shepherd a 45 year old male with prior diagnosesof schizoaffectivedisorder versus schizophrenia, depression and past marijuana use who was transferred from [facility] medical unit after prolonged hospitalization for recovery from multiple significant self-inflicted injuries in response to command auditory hallucinations in a suicide attempt.  Objective:  Medical record reviewed.  Case discussed in detail with members of the treatment team.  I met with and evaluated the patient on the unit today for follow-up.    Patient is reclining in bed dozing but awakens without difficulty and participates appropriately in interaction with me.  Patient appears somewhat improved today.  He maintains good eye contact.  There are no noticeable motor abnormalities.  Speech is spontaneous and of normal rate and amount.  Patient reports feeling less anxious but still somewhat depressed.  He appears calmer, less anxious and a bit brighter.  Thought processes are coherent and goal-directed, less ruminative, less obsessive overall.  Patient reports that he is not focusing as often on his injuries and he is thinking about the future and his life outside the hospital.  He is looking forward to working on his computer when he gets home.  He reports that he continues to experience passive wishes not to be alive but these have decreased in intensity and frequency.  He denies suicidal ideation or thoughts of harming himself.  Patient reports that the voices have diminished and are not there all the time.  They continue to make negative comments when they are present.  He is experiencing a little bit of lightheadedness when he goes from sitting to standing in the morning.  Patient denies other medication side effects.  He reports improved sleep with less middle insomnia.  Appetite is good.  He denies constipation and  last had a bowel movement this morning.  The patient slept 6.75 hours last night.  Vital signs this morning include BP of 118/85 sitting and 76/64 standing, pulse 97 sitting 108 standing, respirations 18, temperature of 97.6.  Labs this morning include CMP with glucose of 127, AST of 13 and otherwise WNL.  CBC and differential with RBC of 4.04, hemoglobin of 11.7, hematocrit of 36.7 and otherwise WNL.  Nursing staff document that patient went outside yesterday, is ambulating more and has been more present in the day room.  He still prefers to isolate in his room rather than attend groups.  Principal Problem: Schizoaffective disorder, bipolar type ([place]) Diagnosis: Principal Problem:   Schizoaffective disorder, bipolar type ([place]) Active Problems:   Insomnia   Anxiety disorder, unspecified  Total Time spent with patient: 20 minutes  Past Psychiatric History: See admission H&P  Past Medical History:  Past Medical History:  Diagnosis Date  . Anxiety disorder, unspecified 01/10/2021  . Bipolar disorder ([place])   . Depression   . Schizoaffective disorder [facility])     Past Surgical History:  Procedure Laterality Date  . LAPAROTOMY N/A 12/25/2020   Procedure: EXPLORATORY LAPAROTOMY; REPAIR OF GROIN LACERATIONS X2, RIGHT THIGH LACERATION X 2; REPAIR RIGHT NECK AND LEFT NECK LACERATION AND REPAIR OF LEFT WRIST LACERATION;  Surgeon: Georganna Skeans, MD;  Location: [place];  Service: General;  Laterality: N/A;  . NO PAST SURGERIES     Family History: History reviewed. No pertinent family history. Family Psychiatric  History: See admission H&P Social History:  Social History   Substance and Sexual Activity  Alcohol Use Not Currently  Social History   Substance and Sexual Activity  Drug Use Yes  . Types: Marijuana    Social History   Socioeconomic History  . Marital status: Single    Spouse name: Not on file  . Number of children: 0  . Years of education: Not on file  . Highest education  level: Not on file  Occupational History  . Not on file  Tobacco Use  . Smoking status: Former Smoker    Quit date: 07/31/2014    Years since quitting: 6.4  . Smokeless tobacco: Never Used  Vaping Use  . Vaping Use: Every day  . Substances: THC, CBD  Substance and Sexual Activity  . Alcohol use: Not Currently  . Drug use: Yes    Types: Marijuana  . Sexual activity: Never  Other Topics Concern  . Not on file  Social History Narrative  . Not on file   Social Determinants of Health   Financial Resource Strain: Not on file  Food Insecurity: Not on file  Transportation Needs: Not on file  Physical Activity: Not on file  Stress: Not on file  Social Connections: Not on file   Additional Social History:                         Sleep: Good  Appetite:  Fair  Current Medications: Current Facility-Administered Medications  Medication Dose Route Frequency Provider Last Rate Last Admin  . acetaminophen (TYLENOL) tablet 650 mg  650 mg Oral Q6H PRN Briant Cedar, MD      . alum & mag hydroxide-simeth (MAALOX/MYLANTA) 200-200-20 MG/5ML suspension 30 mL  30 mL Oral Q4H PRN Briant Cedar, MD      . buPROPion [facility] SR) 12 hr tablet 100 mg  100 mg Oral Daily Arthor Captain, MD   100 mg at 01/26/21 0904  . cloZAPine (CLOZARIL) tablet 175 mg  175 mg Oral QHS Arthor Captain, MD      . docusate sodium (COLACE) capsule 200 mg  200 mg Oral BID Arthor Captain, MD   200 mg at 01/26/21 0905  . feeding supplement (ENSURE ENLIVE / ENSURE PLUS) liquid 237 mL  237 mL Oral TID BM Briant Cedar, MD   237 mL at 01/26/21 0907  . gabapentin (NEURONTIN) capsule 400 mg  400 mg Oral QHS Arthor Captain, MD   400 mg at 01/25/21 2134  . OLANZapine zydis (ZYPREXA) disintegrating tablet 5 mg  5 mg Oral Q8H PRN Harlow Asa, MD   5 mg at 01/21/21 1728   And  . LORazepam (ATIVAN) tablet 1 mg  1 mg Oral PRN Nelda Marseille, Amy E, MD       And  . ziprasidone (GEODON)  injection 20 mg  20 mg Intramuscular PRN Nelda Marseille, Amy E, MD      . magnesium hydroxide (MILK OF MAGNESIA) suspension 30 mL  30 mL Oral Daily PRN Briant Cedar, MD      . melatonin tablet 10 mg  10 mg Oral QHS Harlow Asa, MD   10 mg at 01/25/21 2133  . multivitamin with minerals tablet 1 tablet  1 tablet Oral Daily Briant Cedar, MD   1 tablet at 01/26/21 [phone]  . mupirocin cream (BACTROBAN) 2 %   Topical TID Sharma Covert, MD   1 application at 41/63/84 [phone]  . neomycin-bacitracin-polymyxin (NEOSPORIN) ointment   Topical BID Harlow Asa, MD   1 application  at 01/26/21 0906  . polyethylene glycol (MIRALAX / GLYCOLAX) packet 17 g  17 g Oral Daily Briant Cedar, MD   17 g at 01/26/21 7654    Lab Results:  Results for orders placed or performed during the hospital encounter of 01/09/21 (from the past 48 hour(s))  CBC with Differential/Platelet     Status: Abnormal   Collection Time: 01/25/21  6:50 AM  Result Value Ref Range   WBC 11.8 (H) 4.0 - 10.5 K/uL   RBC 4.20 (L) 4.22 - 5.81 MIL/uL   Hemoglobin 12.4 (L) 13.0 - 17.0 g/dL   HCT 38.2 (L) 39.0 - 52.0 %   MCV 91.0 80.0 - 100.0 fL   MCH 29.5 26.0 - 34.0 pg   MCHC 32.5 30.0 - 36.0 g/dL   RDW 12.9 11.5 - 15.5 %   Platelets 275 150 - 400 K/uL   nRBC 0.0 0.0 - 0.2 %   Neutrophils Relative % 83 %   Neutro Abs 9.8 (H) 1.7 - 7.7 K/uL   Lymphocytes Relative 9 %   Lymphs Abs 1.1 0.7 - 4.0 K/uL   Monocytes Relative 7 %   Monocytes Absolute 0.8 0.1 - 1.0 K/uL   Eosinophils Relative 1 %   Eosinophils Absolute 0.1 0.0 - 0.5 K/uL   Basophils Relative 0 %   Basophils Absolute 0.0 0.0 - 0.1 K/uL   Immature Granulocytes 0 %   Abs Immature Granulocytes 0.04 0.00 - 0.07 K/uL    Comment: Performed at [facility], [place] [address], [place], [place] [address]  TSH     Status: None   Collection Time: 01/25/21  6:50 AM  Result Value Ref Range   TSH 0.828 0.350 - 4.500 uIU/mL    Comment:  Performed by a 3rd Generation assay with a functional sensitivity of <=0.01 uIU/mL. Performed at [facility], [place] [address] [place], [place] [address]   Troponin I (High Sensitivity)     Status: None   Collection Time: 01/25/21  6:50 AM  Result Value Ref Range   Troponin I (High Sensitivity) 9 <18 ng/L    Comment: (NOTE) Elevated high sensitivity troponin I (hsTnI) values and significant  changes across serial measurements may suggest ACS but many other  chronic and acute conditions are known to elevate hsTnI results.  Refer to the "Links" section for chest pain algorithms and additional  guidance. Performed at [facility], [place] [address] [place], [place] [address]   CBC with Differential/Platelet     Status: Abnormal   Collection Time: 01/26/21  6:52 AM  Result Value Ref Range   WBC 6.0 4.0 - 10.5 K/uL   RBC 4.04 (L) 4.22 - 5.81 MIL/uL   Hemoglobin 11.7 (L) 13.0 - 17.0 g/dL   HCT 36.7 (L) 39.0 - 52.0 %   MCV 90.8 80.0 - 100.0 fL   MCH 29.0 26.0 - 34.0 pg   MCHC 31.9 30.0 - 36.0 g/dL   RDW 12.9 11.5 - 15.5 %   Platelets 248 150 - 400 K/uL   nRBC 0.0 0.0 - 0.2 %   Neutrophils Relative % 65 %   Neutro Abs 3.9 1.7 - 7.7 K/uL   Lymphocytes Relative 23 %   Lymphs Abs 1.4 0.7 - 4.0 K/uL   Monocytes Relative 8 %   Monocytes Absolute 0.5 0.1 - 1.0 K/uL   Eosinophils Relative 3 %   Eosinophils Absolute 0.2 0.0 - 0.5 K/uL   Basophils Relative 1 %  Basophils Absolute 0.0 0.0 - 0.1 K/uL   Immature Granulocytes 0 %   Abs Immature Granulocytes 0.01 0.00 - 0.07 K/uL    Comment: Performed at [facility], [place] [address], [place], [place] [address]  Comprehensive metabolic panel     Status: Abnormal   Collection Time: 01/26/21  6:52 AM  Result Value Ref Range   Sodium 138 135 - 145 mmol/L   Potassium 3.7 3.5 - 5.1 mmol/L   Chloride 102 98 - 111 mmol/L   CO2 27 22 - 32 mmol/L   Glucose, Bld 127 (H) 70 - 99 mg/dL     Comment: Glucose reference range applies only to samples taken after fasting for at least 8 hours.   BUN 18 6 - 20 mg/dL   Creatinine, Ser 0.72 0.61 - 1.24 mg/dL   Calcium 9.4 8.9 - 10.3 mg/dL   Total Protein 7.0 6.5 - 8.1 g/dL   Albumin 3.8 3.5 - 5.0 g/dL   AST 13 (L) 15 - 41 U/L   ALT 18 0 - 44 U/L   Alkaline Phosphatase 70 38 - 126 U/L   Total Bilirubin 0.6 0.3 - 1.2 mg/dL   GFR, Estimated >60 >60 mL/min    Comment: (NOTE) Calculated using the CKD-EPI Creatinine Equation (2021)    Anion gap 9 5 - 15    Comment: Performed at [facility], [place] [address], [place], [place] [address]    Blood Alcohol level:  Lab Results  Component Value Date   ETH <10 12/25/2020   ETH <10 00/93/8182    Metabolic Disorder Labs: Lab Results  Component Value Date   HGBA1C 5.2 01/09/2021   MPG 102.54 01/09/2021   MPG 117 06/28/2020   No results found for: PROLACTIN Lab Results  Component Value Date   CHOL 188 11/18/2020   TRIG 124 11/18/2020   HDL 69 11/18/2020   CHOLHDL 2.7 11/18/2020   VLDL 25 11/18/2020   LDLCALC 94 11/18/2020   LDLCALC 128 (H) 06/28/2020    Physical Findings: AIMS: Facial and Oral Movements Muscles of Facial Expression: None, normal Lips and Perioral Area: None, normal Jaw: None, normal Tongue: None, normal,Extremity Movements Upper (arms, wrists, hands, fingers): None, normal Lower (legs, knees, ankles, toes): None, normal, Trunk Movements Neck, shoulders, hips: None, normal, Overall Severity Severity of abnormal movements (highest score from questions above): None, normal Incapacitation due to abnormal movements: None, normal Patient's awareness of abnormal movements (rate only patient's report): No Awareness, Dental Status Current problems with teeth and/or dentures?: No Does patient usually wear dentures?: No  CIWA:    COWS:     Musculoskeletal: Strength & Muscle Tone: within normal limits Gait & Station: normal Patient leans:  N/A  Psychiatric Specialty Exam:  Presentation  General Appearance: Appropriate for Environment; Fairly Groomed  Eye Contact:Good  Speech:Clear and Coherent; Normal Rate  Speech Volume:Normal  Handedness:Right   Mood and Affect  Mood:Depressed  Affect:Appropriate   Thought Process  Thought Processes:Coherent; Goal Directed  Descriptions of Associations:Intact  Orientation:Full (Time, Place and Person)  Thought Content:Logical; Rumination  History of Schizophrenia/Schizoaffective disorder:Yes  Duration of Psychotic Symptoms:Greater than six months  Hallucinations:Hallucinations: Auditory; Other (comment) (Reports auditory hallucinations are less frequent, less constant)  Ideas of Reference:None  Suicidal Thoughts:Suicidal Thoughts: Yes, Passive (Reports decreased frequency and intensity of passive wishes not to be alive) SI Passive Intent and/or Plan: Without Intent; Without Plan  Homicidal Thoughts:Homicidal Thoughts: No   Sensorium  Memory:Immediate Good; Recent Good; Remote Good  Judgment:Fair  Insight:Fair   Executive Functions  Concentration:Good  Attention Span:Good  Recall:Good  Fund of Knowledge:Good  Language:Good   Psychomotor Activity  Psychomotor Activity:Psychomotor Activity: Normal   Assets  Assets:Communication Skills; Desire for Improvement; Housing; Resilience; Social Support   Sleep  Sleep:Sleep: Good Number of Hours of Sleep: 6.75    Physical Exam: Physical Exam Vitals and nursing note reviewed.  Constitutional:      General: He is not in acute distress. HENT:     Head: Normocephalic.  Pulmonary:     Effort: Pulmonary effort is normal.  Neurological:     General: No focal deficit present.     Mental Status: He is alert and oriented to person, place, and time.    Review of Systems  Constitutional: Negative for diaphoresis and fever.  Respiratory: Negative.   Cardiovascular: Negative.   Gastrointestinal:  Negative.  Negative for constipation.  Neurological: Negative for tremors and seizures.       Positive for lightheadedness upon standing in the morning  Psychiatric/Behavioral: Positive for depression, hallucinations and suicidal ideas. The patient does not have insomnia.    Blood pressure (!) 76/64, pulse (!) 108, temperature 97.6 F (36.4 C), temperature source Oral, resp. rate 18, height 6' (1.829 m), weight 67.8 kg, SpO2 97 %. Body mass index is 20.28 kg/m.   Treatment Plan Summary: Daily contact with patient to assess and evaluate symptoms and progress in treatment and Medication management   Continue IVC status  Continue every 15-minute observation status.  Encouragedparticipation in group therapy and therapeutic milieu  Encouragedmovement around unit, getting out of bed,going to cafeteria for meals,attempting spending time in day room.   Psychosis -Consolidateclozapine to single dose of 175mg  Q PM. CBC,ANCandTROPONINWNLon 01/25/21. -Weekly CBC with WBC and ANCand weekly troponinswith next draw 02/01/21. -Clozapine level ordered with next draw on 02/01/2021. -Weekly EKG performed 01/25/2021 revealed normal sinus rhythm, ventricular rate of 97 and QT/QTc of 356/452.  Depression/anxiety -Decrease gabapentin to 300 mg nightlyfor anxiety -Increase bupropion and change to bupropion SR 100mg  daily -Patient would benefit from participation in cognitive behavior therapy and PHP after discharge if he is willing to participate.  Bowel regimen -Continuedocusate 200mg  twice daily -Continue MOM as needed -Continue MiraLAX daily -Push p.o. fluids -Ambulation and out of bed encouraged  Insomnia -Continuemelatonin 10mg  poQHSstanding dose   Wound Care -- dry dressing daily  -- patient encouraged to attend to ADLs and shower  --Need to clarify with urology whether patient requires suture removal from surgical wounds in groin area  Disposition planning  in progress. Patient would benefit from participation in [facility] and outpatient cognitive behavior therapy after discharge if he is willing to participate.  Patient remains on the [facility] transfer waitlist  I certify that inpatient services furnished can reasonably be expected to improve the patient's condition.   Arthor Captain, MD 01/26/2021, 12:51 PM   Addendum: I made direct phone contact this afternoon with patient's mother Adria Dill [phone]) with patient's permission and provided her with an update regarding patient's current symptoms, improvements in symptoms, current medications and continued plan for treatment.  Patient's mother was provided opportunity to ask questions and verbalize concerns.  Her questions were answered.  Patient's mother stated understanding of information discussed and expressed appreciation for the call.

## 2021-01-26 NOTE — Progress Notes (Signed)
   01/26/21 2224  Psych Admission Type (Psych Patients Only)  Admission Status Voluntary  Psychosocial Assessment  Patient Complaints Depression  Eye Contact Brief  Facial Expression Sad;Flat  Affect Appropriate to circumstance  Speech Logical/coherent;Soft  Interaction Assertive  Motor Activity Slow  Appearance/Hygiene Unremarkable  Behavior Characteristics Cooperative;Appropriate to situation  Mood Depressed  Thought Process  Coherency WDL  Content WDL  Delusions None reported or observed  Hallucination Auditory (chatter; quieter)  Judgment Poor  Confusion None  Danger to Self  Current suicidal ideation? Denies  Danger to Others  Danger to Others None reported or observed  Danger to Others Abnormal  Harmful Behavior to others No threats or harm toward other people  Destructive Behavior No threats or harm toward property

## 2021-01-26 NOTE — [facility] Group Notes (Signed)
Adult Psychoeducational Group Note  Date:  01/26/2021 Time:  9:27 AM  Group Topic/Focus:  Goals Group:   The focus of this group is to help patients establish daily goals to achieve during treatment and discuss how the patient can incorporate goal setting into their daily lives to aide in recovery.  Participation Level:  Did Not Attend  Deforest Hoyles [facility] 01/26/2021, 9:27 AM

## 2021-01-26 NOTE — [facility] Group Notes (Signed)
Adult Psychoeducational Group Note  Date:  01/26/2021 Time:  4:31 PM  Group Topic/Focus:  Emotional Education:   The focus of this group is to discuss what feelings/emotions are, and how they are experienced.  Participation Level:  Active  Participation Quality:  Appropriate and Attentive  Affect:  Appropriate  Cognitive:  Alert and Appropriate  Insight: Appropriate and Good  Engagement in Group:  Engaged  Modes of Intervention:  Discussion  Additional Comments:  Pt has a   Margaret Pyle 01/26/2021, 4:31 PM

## 2021-01-26 NOTE — [facility] Group Notes (Signed)
Adult Psychoeducational Group Note  Date:  01/26/2021 Time:  10:35 AM  Group Topic/Focus:  Managing Feelings:   The focus of this group is to identify what feelings patients have difficulty handling and develop a plan to handle them in a healthier way upon discharge.  Participation Level:  Did Not Attend  Sean Shepherd 01/26/2021, 10:35 AM

## 2021-01-26 NOTE — Tx Team (Signed)
Interdisciplinary Treatment and Diagnostic Plan Update  01/26/2021 Time of Session: 9:50am Sean Shepherd MRN: [id number]  Principal Diagnosis: Schizoaffective disorder, bipolar type (HCC)  Secondary Diagnoses: Principal Problem:   Schizoaffective disorder, bipolar type (HCC) Active Problems:   Insomnia   Anxiety disorder, unspecified   Current Medications:  Current Facility-Administered Medications  Medication Dose Route Frequency Provider Last Rate Last Admin  . acetaminophen (TYLENOL) tablet 650 mg  650 mg Oral Q6H PRN Lauro Franklin, MD      . alum & mag hydroxide-simeth (MAALOX/MYLANTA) 200-200-20 MG/5ML suspension 30 mL  30 mL Oral Q4H PRN Lauro Franklin, MD      . buPROPion [facility] SR) 12 hr tablet 100 mg  100 mg Oral Daily Claudie Revering, MD   100 mg at 01/26/21 0904  . cloZAPine (CLOZARIL) tablet 175 mg  175 mg Oral QHS Claudie Revering, MD      . docusate sodium (COLACE) capsule 200 mg  200 mg Oral BID Claudie Revering, MD   200 mg at 01/26/21 0905  . feeding supplement (ENSURE ENLIVE / ENSURE PLUS) liquid 237 mL  237 mL Oral TID BM Lauro Franklin, MD   237 mL at 01/26/21 0907  . gabapentin (NEURONTIN) capsule 400 mg  400 mg Oral QHS Claudie Revering, MD   400 mg at 01/25/21 2134  . OLANZapine zydis (ZYPREXA) disintegrating tablet 5 mg  5 mg Oral Q8H PRN Comer Locket, MD   5 mg at 01/21/21 1728   And  . LORazepam (ATIVAN) tablet 1 mg  1 mg Oral PRN Mason Jim, Amy E, MD       And  . ziprasidone (GEODON) injection 20 mg  20 mg Intramuscular PRN Mason Jim, Amy E, MD      . magnesium hydroxide (MILK OF MAGNESIA) suspension 30 mL  30 mL Oral Daily PRN Lauro Franklin, MD      . melatonin tablet 10 mg  10 mg Oral QHS Comer Locket, MD   10 mg at 01/25/21 2133  . multivitamin with minerals tablet 1 tablet  1 tablet Oral Daily Lauro Franklin, MD   1 tablet at 01/26/21 [phone]  . mupirocin cream (BACTROBAN) 2 %   Topical TID Antonieta Pert, MD   1 application at 01/26/21 [phone]  . neomycin-bacitracin-polymyxin (NEOSPORIN) ointment   Topical BID Comer Locket, MD   1 application at 01/26/21 [phone]  . polyethylene glycol (MIRALAX / GLYCOLAX) packet 17 g  17 g Oral Daily Lauro Franklin, MD   17 g at 01/26/21 2330   PTA Medications: Medications Prior to Admission  Medication Sig Dispense Refill Last Dose  . acetaminophen (TYLENOL) 500 MG tablet Take 2 tablets (1,000 mg total) by mouth every 6 (six) hours as needed. 30 tablet 0   . benztropine (COGENTIN) 1 MG tablet Take 1 tablet (1 mg total) by mouth at bedtime.     . chlorproMAZINE (THORAZINE) 100 MG tablet Take 1 tablet (100 mg total) by mouth daily.     . chlorproMAZINE (THORAZINE) 200 MG tablet Take 1 tablet (200 mg total) by mouth at bedtime.     . docusate sodium (COLACE) 100 MG capsule Take 1 capsule (100 mg total) by mouth 2 (two) times daily. 10 capsule 0   . gabapentin (NEURONTIN) 300 MG capsule Take 1 capsule (300 mg total) by mouth 3 (three) times daily.     . methocarbamol (ROBAXIN) 500 MG tablet Take 2 tablets (1,000  mg total) by mouth every 8 (eight) hours as needed for muscle spasms.     . mirtazapine (REMERON) 30 MG tablet Take 1 tablet (30 mg total) by mouth at bedtime.     . Multiple Vitamin (MULTIVITAMIN WITH MINERALS) TABS tablet Take 1 tablet by mouth daily.     . Oxycodone HCl 10 MG TABS Take 0.5-1 tablets (5-10 mg total) by mouth every 4 (four) hours as needed. 15 tablet 0   . paliperidone (INVEGA) 9 MG 24 hr tablet Take 1 tablet (9 mg total) by mouth at bedtime.       Patient Stressors: Health problems Legal issue Traumatic event  Patient Strengths: Ability for insight Capable of independent living Communication skills Motivation for treatment/growth Supportive family/friends  Treatment Modalities: Medication Management, Group therapy, Case management,  1 to 1 session with clinician, Psychoeducation, Recreational therapy.   Physician  Treatment Plan for Primary Diagnosis: Schizoaffective disorder, bipolar type (HCC) Long Term Goal(s): Improvement in symptoms so as ready for discharge Improvement in symptoms so as ready for discharge   Short Term Goals: Ability to identify changes in lifestyle to reduce recurrence of condition will improve Ability to verbalize feelings will improve Ability to disclose and discuss suicidal ideas Ability to demonstrate self-control will improve Ability to identify and develop effective coping behaviors will improve Compliance with prescribed medications will improve Ability to identify triggers associated with substance abuse/mental health issues will improve Ability to identify changes in lifestyle to reduce recurrence of condition will improve Ability to verbalize feelings will improve Ability to disclose and discuss suicidal ideas Ability to demonstrate self-control will improve Ability to identify and develop effective coping behaviors will improve Compliance with prescribed medications will improve Ability to identify triggers associated with substance abuse/mental health issues will improve  Medication Management: Evaluate patient's response, side effects, and tolerance of medication regimen.  Therapeutic Interventions: 1 to 1 sessions, Unit Group sessions and Medication administration.  Evaluation of Outcomes: Progressing  Physician Treatment Plan for Secondary Diagnosis: Principal Problem:   Schizoaffective disorder, bipolar type (HCC) Active Problems:   Insomnia   Anxiety disorder, unspecified  Long Term Goal(s): Improvement in symptoms so as ready for discharge Improvement in symptoms so as ready for discharge   Short Term Goals: Ability to identify changes in lifestyle to reduce recurrence of condition will improve Ability to verbalize feelings will improve Ability to disclose and discuss suicidal ideas Ability to demonstrate self-control will improve Ability to  identify and develop effective coping behaviors will improve Compliance with prescribed medications will improve Ability to identify triggers associated with substance abuse/mental health issues will improve Ability to identify changes in lifestyle to reduce recurrence of condition will improve Ability to verbalize feelings will improve Ability to disclose and discuss suicidal ideas Ability to demonstrate self-control will improve Ability to identify and develop effective coping behaviors will improve Compliance with prescribed medications will improve Ability to identify triggers associated with substance abuse/mental health issues will improve     Medication Management: Evaluate patient's response, side effects, and tolerance of medication regimen.  Therapeutic Interventions: 1 to 1 sessions, Unit Group sessions and Medication administration.  Evaluation of Outcomes: Progressing   RN Treatment Plan for Primary Diagnosis: Schizoaffective disorder, bipolar type (HCC) Long Term Goal(s): Knowledge of disease and therapeutic regimen to maintain health will improve  Short Term Goals: Ability to remain free from injury will improve, Ability to verbalize frustration and anger appropriately will improve, Ability to demonstrate self-control, Ability to identify and develop  effective coping behaviors will improve and Compliance with prescribed medications will improve  Medication Management: RN will administer medications as ordered by provider, will assess and evaluate patient's response and provide education to patient for prescribed medication. RN will report any adverse and/or side effects to prescribing provider.  Therapeutic Interventions: 1 on 1 counseling sessions, Psychoeducation, Medication administration, Evaluate responses to treatment, Monitor vital signs and CBGs as ordered, Perform/monitor CIWA, COWS, AIMS and Fall Risk screenings as ordered, Perform wound care treatments as  ordered.  Evaluation of Outcomes: Progressing   LCSW Treatment Plan for Primary Diagnosis: Schizoaffective disorder, bipolar type (HCC) Long Term Goal(s): Safe transition to appropriate next level of care at discharge, Engage patient in therapeutic group addressing interpersonal concerns.  Short Term Goals: Engage patient in aftercare planning with referrals and resources, Increase social support, Increase ability to appropriately verbalize feelings, Identify triggers associated with mental health/substance abuse issues and Increase skills for wellness and recovery  Therapeutic Interventions: Assess for all discharge needs, 1 to 1 time with Social worker, Explore available resources and support systems, Assess for adequacy in community support network, Educate family and significant other(s) on suicide prevention, Complete Psychosocial Assessment, Interpersonal group therapy.  Evaluation of Outcomes: Progressing   Progress in Treatment: Attending groups: Yes. and No. Participating in groups: Yes. and No. Taking medication as prescribed: Yes. Toleration medication: Yes. Family/Significant other contact made: Yes, individual(s) contacted:  mother Patient understands diagnosis: Yes. Discussing patient identified problems/goals with staff: Yes. Medical problems stabilized or resolved: Yes. Denies suicidal/homicidal ideation: Yes. Issues/concerns per patient self-inventory: No.    New problem(s) identified: No, Describe:  none  New Short Term/Long Term Goal(s): medication stabilization, elimination of SI thoughts, development of comprehensive mental wellness plan.   Patient Goals:  Did not attend  Discharge Plan or Barriers: CSW will continue to follow and assess for appropriate referrals and possible discharge planning.     Reason for Continuation of Hospitalization: Delusions  Depression Hallucinations Medical Issues Medication stabilization  Estimated Length of Stay: 3-5  days  Attendees: Patient:  01/26/2021 10:32 AM  Physician:  01/26/2021 10:32 AM  Nursing:  01/26/2021 10:32 AM  RN Care Manager: 01/26/2021 10:32 AM  Social Worker: Ruthann Cancer, LCSW 01/26/2021 10:32 AM  Recreational Therapist:  01/26/2021 10:32 AM  Other:  01/26/2021 10:32 AM  Other:  01/26/2021 10:32 AM  Other: 01/26/2021 10:32 AM    Scribe for Treatment Team: Felizardo Hoffmann, LCSWA 01/26/2021 10:32 AM

## 2021-01-26 NOTE — Progress Notes (Signed)
Pt visible in milieu at intervals for scheduled noon group with CSW, medications and meals. Pt reported he slept well last night with fair appetite. Continues to endorse +AH "It's just charter" with decrease frequency and intensity. Denies SI, HI, VH and pain when assessed. Ambulatory in milieu with a steady gait. Eye contact is fair and brief, he remains medication compliant. Pt requires multiple prompts to drink / increase fluid intake throughout this shift.  Scheduled medications given as ordered with verbal education and effects monitored. Urine sample obtained and dressing changes done as ordered. Q 15 minutes safety checks maintained without self harm gestures or outburst thus far. Emotional support and encouragement provided to pt throughout this shift.  Pt tolerates all meals, dressing changes and medications well without discomfort. Remains safe on and off unit. Denies concerns at this time.

## 2021-01-26 NOTE — Progress Notes (Signed)
Recreation Therapy Notes  Date: 5.27.22 Time: 0930 Location: [address] Dayroom  Group Topic: Stress Management  Goal Area(s) Addresses:  Patient will identify positive stress management techniques. Patient will identify benefits of using stress management post d/c.  Intervention: Stress Management  Activity: Meditation.  LRT played a meditation that focused on forgiving self.  Patients were to listen and follow along as the meditation played to fully engage in activity.    Education:  Stress Management, Discharge Planning.   Education Outcome: Acknowledges Education  Clinical Observations/Feedback: Pt did not attend group.    Jossalin Chervenak, LRT/CTRS         Prentiss Hammett A 01/26/2021 11:06 AM 

## 2021-01-26 NOTE — [facility] Group Notes (Signed)
Type of Therapy and Topic:  Group Therapy - Healthy vs Unhealthy Coping Skills  Participation Level:  Active   Description of Group The focus of this group was to determine what unhealthy coping techniques typically are used by group members and what healthy coping techniques would be helpful in coping with various problems. Patients were guided in becoming aware of the differences between healthy and unhealthy coping techniques. Patients were asked to identify 2-3 healthy coping skills they would like to learn to use more effectively.  Therapeutic Goals 1. Patients learned that coping is what human beings do all day long to deal with various situations in their lives 2. Patients defined and discussed healthy vs unhealthy coping techniques 3. Patients identified their preferred coping techniques and identified whether these were healthy or unhealthy 4. Patients determined 2-3 healthy coping skills they would like to become more familiar with and use more often. 5. Patients provided support and ideas to each other   Summary of Patient Progress:  During group, Sean Shepherd expressed that he brushes his teeth every morning as part of his self-care and to help him begin the day. Patient proved open to input from peers and feedback from CSW. Patient demonstrated insight into the subject matter, was respectful of peers, and participated throughout the entire session.  Patient also accepted the worksheets and followed along with those worksheets during the discussion.

## 2021-01-27 DIAGNOSIS — F250 Schizoaffective disorder, bipolar type: Secondary | ICD-10-CM | POA: Diagnosis not present

## 2021-01-27 MED ORDER — HYDROXYZINE HCL 25 MG PO TABS
25.0000 mg | ORAL_TABLET | Freq: Four times a day (QID) | ORAL | Status: DC | PRN
  Administered 2021-01-27: 25 mg via ORAL
  Filled 2021-01-27: qty 1

## 2021-01-27 MED ORDER — ATROPINE SULFATE 1 % OP SOLN
1.0000 [drp] | Freq: Three times a day (TID) | OPHTHALMIC | Status: DC | PRN
  Filled 2021-01-27: qty 2

## 2021-01-27 NOTE — Progress Notes (Signed)
Pt reported feeling lightheaded while obtaining morning vitals. Pt BP have been fluctuating and was lower when standing. Pt was offered fluids x2 and was consumed at 100%. Pt BP was rechecked by this writer and WNL for the Pt. Pt was encouraged to eat breakfast and get rest. Pt gait was observed by this Clinical research associate and was steady. Will continue to monitor and assess. Oncoming nurse assuming care will be notified. Safety maintained.

## 2021-01-27 NOTE — Progress Notes (Signed)
Patient rated his day as a 4 out of 10 since his anxiety level was very high. His goal for tomorrow is to try and lower his anxiety level.

## 2021-01-27 NOTE — Progress Notes (Signed)
Patient attend wrap up group AA. 

## 2021-01-27 NOTE — Progress Notes (Signed)
   01/27/21 2000  Psych Admission Type (Psych Patients Only)  Admission Status Voluntary  Psychosocial Assessment  Patient Complaints Anxiety;Depression  Eye Contact Fair  Facial Expression Sad;Flat  Affect Appropriate to circumstance  Speech Logical/coherent;Soft  Interaction Assertive  Motor Activity Slow  Appearance/Hygiene Unremarkable  Behavior Characteristics Cooperative;Appropriate to situation  Mood Depressed;Anxious;Pleasant  Thought Process  Coherency WDL  Content WDL  Delusions None reported or observed  Perception Hallucinations  Hallucination Auditory (chatter, quieter)  Judgment Poor  Confusion None  Danger to Self  Current suicidal ideation? Denies  Danger to Others  Danger to Others None reported or observed   Pt denies SI, HI, VH. Pt endorses AH saying voices are quieter and like chatter. Pt rates pain 3/10 in abdominal area of self-inflicted wound. Only hurts when he moves. Refused pain meds. Pt says he is more anxious today. Rates anxiety 8/10 and depression 7/10. Says he needs the daily Clozaril dose back again. "I'm sleeping better at night but I'm more anxious during the day without the Clozaril dose." Pt much brighter and with good color to his skin. Pt encouraged to drink more fluids. Had pt drink his evening Ensure in front of this writer to make certain he drank it and didn't just sit it in his room.

## 2021-01-27 NOTE — Progress Notes (Signed)
Pt awake in bed. A & O to self, place and situation. Denies SI, HI, VH and pain when assessed but still reports +AH. Reports his appetite has improved and he slept better last night "I was able to stay asleep without getting up. I got some restful sleep last night". Pt ate 75% of breakfast and lunch this shift. Affect is still flat, he's guraded with fair but brief eye contact. Rates his anxiety 9/10, depression 7/10 and hopelessness 8/10 with stressor being the same as on previous days "Just worried about the future and what to do after here". Visible in milieu with prompts and engaged with others when approached.  Q 15 minutes safety checks maintained without self harm gestures. Dressing changes done to bilateral knees and abdominal incision. Scant amount of purulent drainage noted at lower abdomen during dressing change. Assigned psychiatrist is aware. No new orders at this time. Fluids encouraged and offered to pt throughout this shift due to low BP results.  Emotional support offered to pt this shift. Writer  Encouraged him to attend to his ADLs as well Occupational psychologist & shower). Safety checks maintained at Q 15 minutes intervals without self harm gestures or outburst to note thus far. All medications given with verbal education and effects monitored.  Safety maintained on and off unit (meals & courtyard) without disruptive behavior. Pt tolerates all meals, fluids and medications well without discomfort.

## 2021-01-27 NOTE — Progress Notes (Addendum)
[facility] MD Progress Note  01/27/2021 4:33 PM Sean Shepherd  MRN:  [id number]   Chief Complaint: AVH   Reason for admission: Sean Reiseris a 45 year old male with prior diagnosesof schizoaffectivedisorder versus schizophrenia, depression and past marijuana use who was transferred from [facility] medical unit after prolonged hospitalization for recovery from multiple significant self-inflicted injuries in response to command auditory hallucinations in a suicide attempt. The patient is currently on Hospital Day 18.   Chart Review from last 24 hours:  The patient's chart was reviewed and nursing notes were reviewed. The patient's case was discussed in multidisciplinary team meeting. Per nursing, he attended some groups and endorsed residual AH of "chatter." He has required prompts to drink fluids but no behavioral or acute safety concerns were noted. Per MAR, he has been compliant with scheduled medications and no PRNs were required.  Information Obtained Today During Patient Interview: The patient was seen and evaluated on the unit. On assessment today, he is resting in bed. He states he is sleeping better but is still feeling oversedated first thing in the morning. We discussed that it will take some time to acclimate to the Clozaril dosing. He was encouraged to drink more fluids and to try to be out of bed more. He states the AH are reduced in frequency and intensity and are not command in nature. He states the residual AH are making nonsensical statements. He denies VH, ideas of reference, paranoia, or first rank symptoms. He states his appetite is good and he is moving his bowels. He reports some mild incisional tenderness but voices no other physical complaints other than occasional drooling. He denies SI, HI. He admits he has ruminations about how his life will be different after his self-inflicted injuries and feels guilty about what he did to himself. Supportive therapy provided and  he agrees to engage in PHP/IOP and psychotherapy after discharge.   Principal Problem: Schizoaffective disorder, bipolar type (HCC) Diagnosis: Principal Problem:   Schizoaffective disorder, bipolar type (HCC) Active Problems:   Insomnia   Anxiety disorder, unspecified  Total Time Spent in Direct Patient Care:  I personally spent 30 minutes on the unit in direct patient care. The direct patient care time included face-to-face time with the patient, reviewing the patient's chart, communicating with other professionals, and coordinating care. Greater than 50% of this time was spent in counseling or coordinating care with the patient regarding goals of hospitalization, psycho-education, and discharge planning needs.  Past Psychiatric History: See admission H&P  Past Medical History:  Past Medical History:  Diagnosis Date  . Anxiety disorder, unspecified 01/10/2021  . Bipolar disorder (HCC)   . Depression   . Schizoaffective disorder [facility])     Past Surgical History:  Procedure Laterality Date  . LAPAROTOMY N/A 12/25/2020   Procedure: EXPLORATORY LAPAROTOMY; REPAIR OF GROIN LACERATIONS X2, RIGHT THIGH LACERATION X 2; REPAIR RIGHT NECK AND LEFT NECK LACERATION AND REPAIR OF LEFT WRIST LACERATION;  Surgeon: [name], MD;  Location: [facility] OR;  Service: General;  Laterality: N/A;  . NO PAST SURGERIES     Family History:see admission H&P   Family Psychiatric  History: See admission H&P  Social History:  Social History   Substance and Sexual Activity  Alcohol Use Not Currently     Social History   Substance and Sexual Activity  Drug Use Yes  . Types: Marijuana    Social History   Socioeconomic History  . Marital status: Single    Spouse name: Not on  file  . Number of children: 0  . Years of education: Not on file  . Highest education level: Not on file  Occupational History  . Not on file  Tobacco Use  . Smoking status: Former Smoker    Quit date: 07/31/2014    Years  since quitting: 6.4  . Smokeless tobacco: Never Used  Vaping Use  . Vaping Use: Every day  . Substances: THC, CBD  Substance and Sexual Activity  . Alcohol use: Not Currently  . Drug use: Yes    Types: Marijuana  . Sexual activity: Never  Other Topics Concern  . Not on file  Social History Narrative  . Not on file   Social Determinants of Health   Financial Resource Strain: Not on file  Food Insecurity: Not on file  Transportation Needs: Not on file  Physical Activity: Not on file  Stress: Not on file  Social Connections: Not on file   Sleep: Improved  Appetite:  Good  Current Medications: Current Facility-Administered Medications  Medication Dose Route Frequency Provider Last Rate Last Admin  . acetaminophen (TYLENOL) tablet 650 mg  650 mg Oral Q6H PRN Lauro FranklinPashayan, Alexander S, MD      . alum & mag hydroxide-simeth (MAALOX/MYLANTA) 200-200-20 MG/5ML suspension 30 mL  30 mL Oral Q4H PRN Lauro FranklinPashayan, Alexander S, MD      . buPROPion [facility](WELLBUTRIN SR) 12 hr tablet 100 mg  100 mg Oral Daily Claudie ReveringJames, Martha L, MD   100 mg at 01/27/21 [id number]  . cloZAPine (CLOZARIL) tablet 175 mg  175 mg Oral QHS Claudie ReveringJames, Martha L, MD   175 mg at 01/26/21 2015  . docusate sodium (COLACE) capsule 200 mg  200 mg Oral BID Claudie ReveringJames, Martha L, MD   200 mg at 01/27/21 [id number]  . feeding supplement (ENSURE ENLIVE / ENSURE PLUS) liquid 237 mL  237 mL Oral TID BM Lauro FranklinPashayan, Alexander S, MD   237 mL at 01/27/21 [phone]  . gabapentin (NEURONTIN) capsule 300 mg  300 mg Oral QHS Claudie ReveringJames, Martha L, MD   300 mg at 01/26/21 2127  . hydrOXYzine (ATARAX/VISTARIL) tablet 25 mg  25 mg Oral Q6H PRN Comer LocketSingleton, Marshel Golubski E, MD      . OLANZapine zydis (ZYPREXA) disintegrating tablet 5 mg  5 mg Oral Q8H PRN Comer LocketSingleton, Yarimar Lavis E, MD   5 mg at 01/21/21 1728   And  . LORazepam (ATIVAN) tablet 1 mg  1 mg Oral PRN Mason JimSingleton, Yohana Bartha E, MD       And  . ziprasidone (GEODON) injection 20 mg  20 mg Intramuscular PRN Mason JimSingleton, Tayona Sarnowski E, MD      . magnesium hydroxide  (MILK OF MAGNESIA) suspension 30 mL  30 mL Oral Daily PRN Lauro FranklinPashayan, Alexander S, MD      . melatonin tablet 10 mg  10 mg Oral QHS Comer LocketSingleton, Conlin Brahm E, MD   10 mg at 01/26/21 2128  . multivitamin with minerals tablet 1 tablet  1 tablet Oral Daily Lauro FranklinPashayan, Alexander S, MD   1 tablet at 01/27/21 [phone]  . mupirocin cream (BACTROBAN) 2 %   Topical TID Antonieta Pertlary, Greg Lawson, MD   Given at 01/27/21 1258  . neomycin-bacitracin-polymyxin (NEOSPORIN) ointment   Topical BID Comer LocketSingleton, Stormee Duda E, MD   1 application at 01/27/21 [phone]  . polyethylene glycol (MIRALAX / GLYCOLAX) packet 17 g  17 g Oral Daily Lauro FranklinPashayan, Alexander S, MD   17 g at 01/27/21 [id number]    Lab Results:  Results for orders placed or performed  during the hospital encounter of 01/09/21 (from the past 48 hour(s))  Urinalysis, Complete w Microscopic Urine, Random     Status: Abnormal   Collection Time: 01/25/21  5:35 PM  Result Value Ref Range   Color, Urine STRAW (A) YELLOW   APPearance CLEAR CLEAR   Specific Gravity, Urine 1.005 1.005 - 1.030   pH 7.0 5.0 - 8.0   Glucose, UA NEGATIVE NEGATIVE mg/dL   Hgb urine dipstick NEGATIVE NEGATIVE   Bilirubin Urine NEGATIVE NEGATIVE   Ketones, ur NEGATIVE NEGATIVE mg/dL   Protein, ur NEGATIVE NEGATIVE mg/dL   Nitrite NEGATIVE NEGATIVE   Leukocytes,Ua TRACE (A) NEGATIVE   RBC / HPF 0-5 0 - 5 RBC/hpf   WBC, UA 0-5 0 - 5 WBC/hpf   Bacteria, UA RARE (A) NONE SEEN    Comment: Performed at [facility], [address], [place], [place] [address]  CBC with Differential/Platelet     Status: Abnormal   Collection Time: 01/26/21  6:52 AM  Result Value Ref Range   WBC 6.0 4.0 - 10.5 K/uL   RBC 4.04 (L) 4.22 - 5.81 MIL/uL   Hemoglobin 11.7 (L) 13.0 - 17.0 g/dL   HCT 41.9 (L) 62.2 - 29.7 %   MCV 90.8 80.0 - 100.0 fL   MCH 29.0 26.0 - 34.0 pg   MCHC 31.9 30.0 - 36.0 g/dL   RDW 98.9 21.1 - 94.1 %   Platelets 248 150 - 400 K/uL   nRBC 0.0 0.0 - 0.2 %   Neutrophils Relative % 65 %   Neutro  Abs 3.9 1.7 - 7.7 K/uL   Lymphocytes Relative 23 %   Lymphs Abs 1.4 0.7 - 4.0 K/uL   Monocytes Relative 8 %   Monocytes Absolute 0.5 0.1 - 1.0 K/uL   Eosinophils Relative 3 %   Eosinophils Absolute 0.2 0.0 - 0.5 K/uL   Basophils Relative 1 %   Basophils Absolute 0.0 0.0 - 0.1 K/uL   Immature Granulocytes 0 %   Abs Immature Granulocytes 0.01 0.00 - 0.07 K/uL    Comment: Performed at [facility], [address], [place], [place] [address]  Comprehensive metabolic panel     Status: Abnormal   Collection Time: 01/26/21  6:52 AM  Result Value Ref Range   Sodium 138 135 - 145 mmol/L   Potassium 3.7 3.5 - 5.1 mmol/L   Chloride 102 98 - 111 mmol/L   CO2 27 22 - 32 mmol/L   Glucose, Bld 127 (H) 70 - 99 mg/dL    Comment: Glucose reference range applies only to samples taken after fasting for at least 8 hours.   BUN 18 6 - 20 mg/dL   Creatinine, Ser 4.48 0.61 - 1.24 mg/dL   Calcium 9.4 8.9 - 18.5 mg/dL   Total Protein 7.0 6.5 - 8.1 g/dL   Albumin 3.8 3.5 - 5.0 g/dL   AST 13 (L) 15 - 41 U/L   ALT 18 0 - 44 U/L   Alkaline Phosphatase 70 38 - 126 U/L   Total Bilirubin 0.6 0.3 - 1.2 mg/dL   GFR, Estimated >63 >14 mL/min    Comment: (NOTE) Calculated using the CKD-EPI Creatinine Equation (2021)    Anion gap 9 5 - 15    Comment: Performed at [facility], [address] [place], [place] [address]    Blood Alcohol level:  Lab Results  Component Value Date   ETH <10 12/25/2020   ETH <10 11/18/2020  Metabolic Disorder Labs: Lab Results  Component Value Date   HGBA1C 5.2 01/09/2021   MPG 102.54 01/09/2021   MPG 117 06/28/2020   No results found for: PROLACTIN Lab Results  Component Value Date   CHOL 188 11/18/2020   TRIG 124 11/18/2020   HDL 69 11/18/2020   CHOLHDL 2.7 11/18/2020   VLDL 25 11/18/2020   LDLCALC 94 11/18/2020   LDLCALC 128 (H) 06/28/2020    Physical Findings: AIMS: Facial and Oral Movements Muscles of Facial  Expression: None, normal Lips and Perioral Area: None, normal Jaw: None, normal Tongue: None, normal,Extremity Movements Upper (arms, wrists, hands, fingers): None, normal Lower (legs, knees, ankles, toes): None, normal, Trunk Movements Neck, shoulders, hips: None, normal, Overall Severity Severity of abnormal movements (highest score from questions above): None, normal Incapacitation due to abnormal movements: None, normal Patient's awareness of abnormal movements (rate only patient's report): No Awareness, Dental Status Current problems with teeth and/or dentures?: No Does patient usually wear dentures?: No     Musculoskeletal: Strength & Muscle Tone: within normal limits Gait & Station: untested, resting in bed Patient leans: N/A  Psychiatric Specialty Exam: Physical Exam Vitals and nursing note reviewed.  HENT:     Head: Normocephalic and atraumatic.  Pulmonary:     Effort: Pulmonary effort is normal.  Musculoskeletal:     Comments: Midline incision, clean, dry intact with minimal serosanguinous drainage on bandage from lowest portion of incision. Groin incision clean, dry with intact sutures.  Neurological:     General: No focal deficit present.     Mental Status: He is alert.      Review of Systems  Constitutional: Positive for fatigue. Negative for fever.  HENT: Positive for drooling.   Respiratory: Negative for shortness of breath.   Cardiovascular: Negative for chest pain.  Gastrointestinal: Negative for constipation, diarrhea, nausea and vomiting.  Psychiatric/Behavioral: Positive for depression.    Blood pressure 118/78, pulse 92, temperature 98.4 F (36.9 C), temperature source Oral, resp. rate 18, height 6' (1.829 m), weight 67.1 kg, SpO2 97 %.Body mass index is 20.07 kg/m.  General Appearance: casually dressed, fair hygiene  Eye Contact:  Good  Speech:  Clear and Coherent and Normal Rate  Volume:  Decreased  Mood:  dysphoric  Affect:  Constricted,  guarded  Thought Process:  Goal Directed and Linear but ruminative about consequences of self-inflicted injuries  Orientation:  Full (Time, Place, and Person)  Thought Content: Endorses AH that are non-command in nature and denies VH, ideas of reference, or first rank symptoms; no delusions noted and is not responding to internal/external stimuli on exam  Suicidal Thoughts:  No  Homicidal Thoughts:  No  Memory:  Recent;   Good  Judgement:  Fair  Insight:  Fair  Psychomotor Activity:  Normal  Concentration:  Concentration: Fair and Attention Span: Fair  Recall:  Good  Fund of Knowledge:  Good  Language:  Good  Akathisia:  Negative  Assets:  Desire for Improvement Resilience  ADL's:  Intact  Cognition:  WNL  Sleep:  Number of Hours: 6.75   Treatment Plan Summary: Daily contact with patient to assess and evaluate symptoms and progress in treatment and Medication management   Continue IVC status  Continue every 15-minute observation status.  Encouragedparticipation in group therapy and therapeutic milieu  Encouragedmovement around unit, getting out of bed, going to cafeteria for meals, attempting spending time in day room.   Psychosis -Continue clozapine  Q PM. CBC,ANCandTROPONINWNLon 01/25/21. -Weekly CBC with WBC  and ANCand weekly troponinswith next draw 02/01/21. -Clozapine level ordered with next draw on 02/01/2021. -Weekly EKG performed 01/25/2021 revealed normal sinus rhythm, ventricular rate of97and QT/QTc of 356/452. - Atropine eye drops PRN drooling  Depression/anxiety -Continue gabapentin300 mg nightlyfor anxiety -Continue bupropion SR 100mg  daily - Start Vistaril 25mg  q6 hours PRN anxiety (monitoring for excess anticholinergic side-effects while on Clozaril with Vistaril) -Patient would benefit from participation in cognitive behavior therapy and PHP after discharge if he is willing to participate.  Bowel regimen -Continuedocusate 200mg   twice daily -Continue MOM as needed -Continue MiraLAX daily -Push p.o. fluids -Ambulation and out of bed encouraged  Insomnia -Continuemelatonin10mg  poQHSstanding dose   Wound Care -- dry dressing daily  -- patient encouraged to attend to ADLs and shower --Need to clarify with urology whether patient requires suture removal from surgical wounds in groin area  Disposition planning in progress. Patient would benefit from participation in [facility] and outpatient cognitive behavior therapy after discharge if he is willing to participate.  Patient remains on the [facility] transfer waitlist  I certify that inpatient services furnished can reasonably be expected to improve the patient's condition.  , MD, FAPA 01/27/2021, 4:33 PM

## 2021-01-27 NOTE — BHH Group Notes (Signed)
LCSW Group Therapy Note  01/27/2021   10:00-11:00am   Type of Therapy and Topic:  Group Therapy: Anger Cues and Responses  Participation Level:  Did Not Attend   Description of Group:   In this group, patients learned how to recognize the physical, cognitive, emotional, and behavioral responses they have to anger-provoking situations.  They identified a recent time they became angry and how they reacted.  They analyzed how their reaction was possibly beneficial and how it was possibly unhelpful.  The group discussed a variety of healthier coping skills that could help with such a situation in the future.  Focus was placed on how helpful it is to recognize the underlying emotions to our anger, because working on those can lead to a more permanent solution as well as our ability to focus on the important rather than the urgent.  Therapeutic Goals: Patients will remember their last incident of anger and how they felt emotionally and physically, what their thoughts were at the time, and how they behaved. Patients will identify how their behavior at that time worked for them, as well as how it worked against them. Patients will explore possible new behaviors to use in future anger situations. Patients will learn that anger itself is normal and cannot be eliminated, and that healthier reactions can assist with resolving conflict rather than worsening situations.  Summary of Patient Progress:  The patient did not attend.  Therapeutic Modalities:   Cognitive Behavioral Therapy  Sherl Yzaguirre J Grossman-Orr    

## 2021-01-28 DIAGNOSIS — F250 Schizoaffective disorder, bipolar type: Secondary | ICD-10-CM | POA: Diagnosis not present

## 2021-01-28 MED ORDER — GABAPENTIN 100 MG PO CAPS
100.0000 mg | ORAL_CAPSULE | Freq: Every day | ORAL | Status: DC
  Administered 2021-01-28: 100 mg via ORAL
  Filled 2021-01-28 (×3): qty 1

## 2021-01-28 NOTE — Progress Notes (Signed)
The focus of this group is to help patients review their daily goal of treatment and discuss progress on daily workbooks. Pt attended the evening group session and responded to all discussion prompts from the Writer. Pt shared that today was an "okay" day on the unit, the highlight of which was going outside with his peers for fresh air. "I played basketball yesterday, but today I just sat and enjoyed being outside."  Pt told that his goal for this coming week was to get his anxiety under control through a combination of medication and coping skills. Pt complained of feeling anxiety earlier today, but said he was able to talk to staff, which helped alleviate it.  Pt rated his day a 4 out of 10 and his affect was appropriate.

## 2021-01-28 NOTE — Progress Notes (Signed)
   01/28/21 0612  Vital Signs  Pulse Rate (!) 103  BP (!) 79/65  BP Location Left Arm  BP Method Automatic  Patient Position (if appropriate) Standing  Sleep  Number of Hours 6.25   Pt given 12 oz Gatorade. Had pt start drinking it while observed.

## 2021-01-28 NOTE — B[facility] Group Notes (Signed)
Did not attend group 

## 2021-01-28 NOTE — Progress Notes (Signed)
Pt reported that he was feeling weak and lethargic this morning and pt stated he was unable to go to morning group session.  Pt gradually started to become more alert, and pt went to the dining room for lunch and also had the chance to talk on the phone to his family.  Pt also went outside with the other patients.  RN changed abdominal dressing. Pt tolerated dressing change without incident.  RN provided support and assessed for needs and concerns.  Pt remains safe on the unit with q 15 min checks in place.

## 2021-01-28 NOTE — Progress Notes (Signed)
[facility]  01/28/2021 2:19 PM Sean Shepherd  MRN:  [id number]   Chief Complaint: AVH   Reason for admission: Sherri RadChanning Reiseris a 45 year old male with prior diagnosesof schizoaffectivedisorder versus schizophrenia, depression and past marijuana use who [facility] after prolonged hospitalization for recovery from multiple significant self-inflicted injuries in response to command auditory hallucinations in a suicide attempt. The patient is currently on Hospital Day 19.   Chart Review from last 24 hours:  The patient's chart was reviewed and nursing notes were reviewed. The patient's case was discussed in multidisciplinary team meeting. Per nursing, he had low BP this morning but is requiring prompts to drink fluids. He had no behavioral issues noted but continues to be isolative on the unit. He has required prompts to attend to ADLs and only attended 1 group. He has expressed anxiety issues and residual AH to staff. Per MAR, he was compliant with scheduled medications and did receive Vistaril X 1 for anxiety.  Information Obtained Today During Patient Interview: The patient was seen and evaluated on the unit. On assessment today, he is resting in bed but denies daytime sedation. He denies current issues with drooling. He states he is sleeping better at night after the consolidation of his Clozaril to qhs dosing. He questions if his anxiety is increased some now that he does not have a daytime dose of Clozaril. He states he tried a dose of Vistaril yesterday for anxiety but he did not find it to be beneficial.He reports plans to attend evening group but admits he has not been out of his room much today other than to walk to the cafeteria for lunch. He was prompted to drink fluids, ambulate, and be out of bed. He states he showered yesterday and he is moving his bowels. He denies incisional pain, and he voices no other physical complaints. He  denies SI, HI, AVH, paranoia, ideas of reference, or first rank symptoms. He describes having intrusive worry thoughts about "the unknown" and about "how my future will be." I discussed the fact that he is having low blood pressures in the mornings and that I will reduce his nighttime Neurontin in the event it is contributing to orthostatic blood pressure drops with concomitant use of Clozaril.   Principal Problem: Schizoaffective disorder, bipolar type (HCC) Diagnosis: Principal Problem:   Schizoaffective disorder, bipolar type (HCC) Active Problems:   Insomnia   Anxiety disorder, unspecified  Total Time Spent in Direct Patient Care:  I personally spent 30 minutes on the unit in direct patient care. The direct patient care time included face-to-face time with the patient, reviewing the patient's chart, communicating with other professionals, and coordinating care. Greater than 50% of this time was spent in counseling or coordinating care with the patient regarding goals of hospitalization, psycho-education, and discharge planning needs.  Past Psychiatric History: See admission H&P  Past Medical History:  Past Medical History:  Diagnosis Date  . Anxiety disorder, unspecified 01/10/2021  . Bipolar disorder (HCC)   . Depression   . Schizoaffective disorder [facility](HCC)     Past Surgical History:  Procedure Laterality Date  . LAPAROTOMY N/A 12/25/2020   Procedure: EXPLORATORY LAPAROTOMY; REPAIR OF GROIN LACERATIONS X2, RIGHT THIGH LACERATION X 2; REPAIR RIGHT NECK AND LEFT NECK LACERATION AND REPAIR OF LEFT WRIST LACERATION;  Surgeon: Violeta Gelinashompson, Burke, MD;  [facility];  Service: General;  Laterality: N/A;  . NO PAST SURGERIES     Family History:see admission H&P  Family Psychiatric  History: See admission H&P  Social History:  Social History   Substance and Sexual Activity  Alcohol Use Not Currently     Social History   Substance and Sexual Activity  Drug Use Yes  . Types:  Marijuana    Social History   Socioeconomic History  . Marital status: Single    Spouse name: Not on file  . Number of children: 0  . Years of education: Not on file  . Highest education level: Not on file  Occupational History  . Not on file  Tobacco Use  . Smoking status: Former Smoker    Quit date: 07/31/2014    Years since quitting: 6.5  . Smokeless tobacco: Never Used  Vaping Use  . Vaping Use: Every day  . Substances: THC, CBD  Substance and Sexual Activity  . Alcohol use: Not Currently  . Drug use: Yes    Types: Marijuana  . Sexual activity: Never  Other Topics Concern  . Not on file  Social History Narrative  . Not on file   Social Determinants of Health   Financial Resource Strain: Not on file  Food Insecurity: Not on file  Transportation Needs: Not on file  Physical Activity: Not on file  Stress: Not on file  Social Connections: Not on file   Sleep: Good  Appetite:  Good  Current Medications: Current Facility-Administered Medications  Medication Dose Route Frequency Provider Last Rate Last Admin  . acetaminophen (TYLENOL) tablet 650 mg  650 mg Oral Q6H PRN Lauro Franklin, MD      . alum & mag hydroxide-simeth (MAALOX/MYLANTA) 200-200-20 MG/5ML suspension 30 mL  30 mL Oral Q4H PRN Lauro Franklin, MD      . atropine 1 % ophthalmic solution 1 drop  1 drop Sublingual Q8H PRN Mason Jim, Carlen Fils E, MD      . buPROPion [facility] SR) 12 hr tablet 100 mg  100 mg Oral Daily Claudie Revering, MD   100 mg at 01/28/21 2637  . cloZAPine (CLOZARIL) tablet 175 mg  175 mg Oral QHS Claudie Revering, MD   175 mg at 01/27/21 2115  . docusate sodium (COLACE) capsule 200 mg  200 mg Oral BID Claudie Revering, MD   200 mg at 01/28/21 0953  . feeding supplement (ENSURE ENLIVE / ENSURE PLUS) liquid 237 mL  237 mL Oral TID BM Lauro Franklin, MD   237 mL at 01/28/21 0958  . gabapentin (NEURONTIN) capsule 100 mg  100 mg Oral QHS Jeris Easterly E, MD      .  hydrOXYzine (ATARAX/VISTARIL) tablet 25 mg  25 mg Oral Q6H PRN Comer Locket, MD   25 mg at 01/27/21 1813  . OLANZapine zydis (ZYPREXA) disintegrating tablet 5 mg  5 mg Oral Q8H PRN Comer Locket, MD   5 mg at 01/21/21 1728   And  . LORazepam (ATIVAN) tablet 1 mg  1 mg Oral PRN Comer Locket, MD       And  . ziprasidone (GEODON) injection 20 mg  20 mg Intramuscular PRN Mason Jim, Qusay Villada E, MD      . magnesium hydroxide (MILK OF MAGNESIA) suspension 30 mL  30 mL Oral Daily PRN Lauro Franklin, MD      . melatonin tablet 10 mg  10 mg Oral QHS Bartholomew Crews E, MD   10 mg at 01/27/21 2115  . multivitamin with minerals tablet 1 tablet  1 tablet Oral Daily Rhea Belton  S, MD   1 tablet at 01/28/21 0953  . mupirocin cream (BACTROBAN) 2 %   Topical TID Antonieta Pert, MD   1 application at 01/27/21 1717  . neomycin-bacitracin-polymyxin (NEOSPORIN) ointment   Topical BID Comer Locket, MD   1 application at 01/27/21 1717  . polyethylene glycol (MIRALAX / GLYCOLAX) packet 17 g  17 g Oral Daily Lauro Franklin, MD   17 g at 01/28/21 8756    Lab Results:  No results found for this or any previous visit (from the past 48 hour(s)).  Blood Alcohol level:  Lab Results  Component Value Date   ETH <10 12/25/2020   ETH <10 11/18/2020    Metabolic Disorder Labs: Lab Results  Component Value Date   HGBA1C 5.2 01/09/2021   MPG 102.54 01/09/2021   MPG 117 06/28/2020   No results found for: PROLACTIN Lab Results  Component Value Date   CHOL 188 11/18/2020   TRIG 124 11/18/2020   HDL 69 11/18/2020   CHOLHDL 2.7 11/18/2020   VLDL 25 11/18/2020   LDLCALC 94 11/18/2020   LDLCALC 128 (H) 06/28/2020    Physical Findings: AIMS: Facial and Oral Movements Muscles of Facial Expression: None, normal Lips and Perioral Area: None, normal Jaw: None, normal Tongue: None, normal,Extremity Movements Upper (arms, wrists, hands, fingers): None, normal Lower (legs, knees,  ankles, toes): None, normal, Trunk Movements Neck, shoulders, hips: None, normal, Overall Severity Severity of abnormal movements (highest score from questions above): None, normal Incapacitation due to abnormal movements: None, normal Patient's awareness of abnormal movements (rate only patient's report): No Awareness, Dental Status Current problems with teeth and/or dentures?: No Does patient usually wear dentures?: No     Musculoskeletal: Strength & Muscle Tone: untested, resting in bed Gait & Station: untested, resting in bed Patient leans: N/A  Psychiatric Specialty Exam: Physical Exam Vitals and nursing note reviewed.  HENT:     Head: Normocephalic and atraumatic.  Pulmonary:     Effort: Pulmonary effort is normal.  Neurological:     General: No focal deficit present.     Mental Status: He is alert.      Review of Systems  Constitutional: Negative for fatigue and fever.  HENT: Negative for drooling.   Respiratory: Negative for shortness of breath.   Cardiovascular: Negative for chest pain.  Gastrointestinal: Negative for constipation, diarrhea, nausea and vomiting.  Psychiatric/Behavioral: Positive for depression.    Blood pressure 118/74, pulse 83, temperature 98 F (36.7 C), temperature source Oral, resp. rate 16, height 6' (1.829 m), weight 67.4 kg, SpO2 96 %.Body mass index is 20.14 kg/m.  General Appearance: casually dressed, fair hygiene  Eye Contact:  Good  Speech:  Clear and Coherent and Normal Rate  Volume:  Decreased  Mood:  dysphoric  Affect:  Constricted  Thought Process:  Goal Directed and Linear but ruminative about consequences of self-inflicted injuries  Orientation:  Full (Time, Place, and Person)  Thought Content: Denies AVH, ideas of reference, or first rank symptoms; no delusions noted and is not responding to internal/external stimuli on exam  Suicidal Thoughts:  No  Homicidal Thoughts:  No  Memory:  Recent;   Good  Judgement:  Fair   Insight:  Fair  Psychomotor Activity:  Decreased  Concentration:  Concentration: Fair and Attention Span: Fair  Recall:  Good  Fund of Knowledge:  Good  Language:  Good  Akathisia:  Negative  Assets:  Desire for Improvement Resilience  ADL's:  Independent  Cognition:  WNL  Sleep:  Number of Hours: 6.25   Treatment Plan Summary: Daily contact with patient to assess and evaluate symptoms and progress in treatment and Medication management   Continue IVC status  Continue every 15-minute observation status.  Encouragedparticipation in group therapy and therapeutic milieu  Encouragedmovement around unit, getting out of bed, going to cafeteria for meals, attempting spending time in day room.   Psychosis -Continue clozapine 175mg  Q PM. CBC,ANCandTROPONINWNLon 01/25/21. -Weekly CBC with WBC and ANCand weekly troponinswith next draw 02/01/21. -Clozapine level ordered with next draw on 02/01/2021. -Weekly EKG performed 01/25/2021 revealed normal sinus rhythm, ventricular rate of97and QT/QTc of 356/452. - Atropine eye drops PRN drooling  Depression/anxiety -Reduce gabapentinto 100 mg nightlyfor anxiety in the event it is adding to hypotension -Continue bupropion SR 100mg  daily - Start Vistaril 25mg  q6 hours PRN anxiety (monitoring for excess anticholinergic side-effects while on Clozaril with Vistaril) -Patient would benefit from participation in cognitive behavior therapy and PHP after discharge if he is willing to participate.  Bowel regimen -Continuedocusate 200mg  twice daily -Continue MOM as needed -Continue MiraLAX daily -Push p.o. fluids -Ambulation and out of bed encouraged  Insomnia -Continuemelatonin10mg  poQHSstanding dose   Wound Care -- dry dressing daily  -- patient encouraged to attend to ADLs and shower --Need to clarify with urology whether patient requires suture removal from surgical wounds in groin area  Disposition planning  in progress. Patient would benefit from participation in [facility] and outpatient cognitive behavior therapy after discharge if he is willing to participate.  Patient [facility] on the [facility] transfer waitlist  I certify that inpatient services furnished can reasonably be expected to improve the patient's condition.  , MD, FAPA 01/28/2021, 2:19 PM

## 2021-01-28 NOTE — [facility] Group Notes (Addendum)
LCSW Group Therapy Note  01/28/2021       Type of Therapy and Topic:  Group Therapy: "My Mental Health"  Participation Level:  Did Not Attend   Description of Group:   In this group, patients were asked four questions in order to generate discussion around the idea of mental illness and/or substance addiction being medical problems: 1. In one sentence describe the current state of your mental health or substance use. 2. How much do you feel similar to or different from others? 3. Do you tend to identify with other people or compare yourself to them?  4. In a word or sentence, share what you desire your mental health or substance use to be moving forward.  Discussion was held that led to the conclusion that comparing ourselves to others is not healthy, but identifying with the elements of their issues that are similar to ours is helpful.    Therapeutic Goals: 1. Patients will identify their feelings about their current mental health/substance use problems. 2. Patients will describe how they feel similar to or different from others, and whether they tend to identify with or compare themselves to other people with the same issues. 3. Patients will explore the differences in these concepts and how a change of mindset about mental health/substance use can help with reaching recovery goals. 4. Patients will think about and share what their recovery goals are, in terms of mental health and/or substance use.  Summary of Patient Progress:  The patient did not attend.  Therapeutic Modalities:   Processing Motivational Interviewing Psychoeducation  Lynnell Chad, MSW, LCSW

## 2021-01-29 MED ORDER — HYDROXYZINE HCL 25 MG PO TABS
25.0000 mg | ORAL_TABLET | Freq: Three times a day (TID) | ORAL | Status: DC | PRN
  Administered 2021-02-02: 25 mg via ORAL
  Filled 2021-01-29: qty 1
  Filled 2021-01-29: qty 10

## 2021-01-29 MED ORDER — DOCUSATE SODIUM 100 MG PO CAPS
100.0000 mg | ORAL_CAPSULE | Freq: Two times a day (BID) | ORAL | Status: DC
  Administered 2021-01-30 – 2021-02-04 (×10): 100 mg via ORAL
  Filled 2021-01-29 (×5): qty 1
  Filled 2021-01-29: qty 20
  Filled 2021-01-29 (×4): qty 1
  Filled 2021-01-29: qty 20
  Filled 2021-01-29 (×4): qty 1

## 2021-01-29 MED ORDER — MELATONIN 5 MG PO TABS
10.0000 mg | ORAL_TABLET | Freq: Every day | ORAL | Status: DC
  Administered 2021-01-29 – 2021-02-03 (×6): 10 mg via ORAL
  Filled 2021-01-29 (×5): qty 2
  Filled 2021-01-29: qty 20
  Filled 2021-01-29 (×2): qty 2

## 2021-01-29 NOTE — Progress Notes (Signed)
Pt was able to attend this morning's group session and share appropriately. Pt stated that his goal for today was to try to get his anxiety under control."

## 2021-01-29 NOTE — Progress Notes (Signed)
   01/28/21 0900  Psych Admission Type (Psych Patients Only)  Admission Status Voluntary  Psychosocial Assessment  Patient Complaints Anhedonia;Depression;Isolation  Eye Contact Fair  Facial Expression Sad;Flat  Affect Appropriate to circumstance  Speech Logical/coherent;Soft  Interaction Assertive  Motor Activity Slow  Appearance/Hygiene Unremarkable  Behavior Characteristics Cooperative;Anxious;Guarded  Mood Depressed;Anxious  Thought Process  Coherency WDL  Content WDL  Delusions None reported or observed  Perception Hallucinations  Hallucination Auditory (chatter, quieter)  Judgment Poor  Confusion None  Danger to Self  Current suicidal ideation? Denies  Danger to Others  Danger to Others None reported or observed

## 2021-01-29 NOTE — Progress Notes (Signed)
Recreation Therapy Notes  Date:  5.30.22 Time: 0930 Location: [address] Dayroom  Group Topic: Stress Management  Goal Area(s) Addresses:  Patient will identify positive stress management techniques. Patient will identify benefits of using stress management post d/c.  Intervention: Stress Management  Activity :  Meditation.  LRT played Shepherd meditation that focused on being persistent in not only meditation but in life as well.  Patients were to listen and follow along as meditation played to fully engage in activity.  Education:  Stress Management, Discharge Planning.   Education Outcome: Acknowledges Education  Clinical Observations/Feedback: Pt did not attend group session.    Sean Shepherd, LRT/CTRS         Sean Shepherd 01/29/2021 12:02 PM 

## 2021-01-29 NOTE — Progress Notes (Signed)
[facility] Group Notes:  (Nursing/MHT/Case Management/Adjunct)  Date:  01/29/2021  Time:  8:59 PM  Type of Therapy:  Group Therapy  Participation Level:  Active  Participation Quality:  Attentive  Affect:  Appropriate  Cognitive:  Appropriate  Insight:  Appropriate  Engagement in Group:  Developing/Improving  Modes of Intervention:  Discussion  Summary of Progress/Problems:  Lorita Officer 01/29/2021, 8:59 PM

## 2021-01-29 NOTE — Progress Notes (Signed)
[facility] MD Progress Note  01/29/2021 5:02 PM Marc Leichter  MRN:  [id number]   Reason for admission: Sean Reiseris a 45 year old male with prior diagnosesof schizoaffectivedisorder versus schizophrenia, depression and past marijuana use who was transferred from [facility] medical unit after prolonged hospitalization for recovery from multiple significant self-inflicted injuries in response to command auditory hallucinations in a suicide attempt.  Objective:  Medical record reviewed.  Case discussed in detail with members of the treatment team.  I met with and evaluated the patient on the unit today for follow-up.  On interview he appears calmer and more relaxed than he was last week.   Patient reports that he feels he is doing better on clozapine and that the voices have significantly diminished.  He denies that he is hearing any voices now but continues to experience external sounds as being translated into yes or no responses to the questions running through his head such as "will I be okay?  What will the future be like?  Will like ever live alone again?".  I encouraged patient to stay focused on the future rather than trying to anticipate worries about the past as this will exacerbate his current mood and anxiety symptoms.  The patient reports that his mood is still somewhat depressed but less depressed than before.  His anxiety is still present particularly around other people where he feels they will negatively appraise him.  He denies passive wish for death, SI, AI, HI.  He is less concerned about police arresting him or his family members because of his past marijuana use.  He reports his appetite is okay.  Last bowel movement was this morning.  He denies having to strain.  He is sleeping better on clozapine but feels a little tired in the morning.  He has some mild orthostasis in the morning but denies other medication side effects.  The patient slept 6.75 hours last night.  No new  labs today.  Vital signs this morning include BP of 128/83 sitting and 103/71 standing, pulse of 99 sitting and 103 standing, respirations of 16 and temperature of 98.  Chart notes indicate that patient tends to be isolative to his room and missed breakfast this morning but has been attending lunch and dinner in the dining room.  He has been attending groups intermittently.  He does not appear very motivated to attend groups or participate in his care other than taking medications and attempts to avoid interactions with others so as not to experience social anxiety symptoms.  Principal Problem: Schizoaffective disorder, bipolar type ([place]) Diagnosis: Principal Problem:   Schizoaffective disorder, bipolar type ([place]) Active Problems:   Insomnia   Anxiety disorder, unspecified  Total Time spent with patient: 20 minutes  Past Psychiatric History: See admission H&P  Past Medical History:  Past Medical History:  Diagnosis Date  . Anxiety disorder, unspecified 01/10/2021  . Bipolar disorder ([place])   . Depression   . Schizoaffective disorder [facility])     Past Surgical History:  Procedure Laterality Date  . LAPAROTOMY N/A 12/25/2020   Procedure: EXPLORATORY LAPAROTOMY; REPAIR OF GROIN LACERATIONS X2, RIGHT THIGH LACERATION X 2; REPAIR RIGHT NECK AND LEFT NECK LACERATION AND REPAIR OF LEFT WRIST LACERATION;  Surgeon: Georganna Skeans, MD;  Location: [place];  Service: General;  Laterality: N/A;  . NO PAST SURGERIES     Family History: History reviewed. No pertinent family history. Family Psychiatric  History: See admission H&P Social History:  Social History   Substance and  Sexual Activity  Alcohol Use Not Currently     Social History   Substance and Sexual Activity  Drug Use Yes  . Types: Marijuana    Social History   Socioeconomic History  . Marital status: Single    Spouse name: Not on file  . Number of children: 0  . Years of education: Not on file  . Highest education level: Not on  file  Occupational History  . Not on file  Tobacco Use  . Smoking status: Former Smoker    Quit date: 07/31/2014    Years since quitting: 6.5  . Smokeless tobacco: Never Used  Vaping Use  . Vaping Use: Every day  . Substances: THC, CBD  Substance and Sexual Activity  . Alcohol use: Not Currently  . Drug use: Yes    Types: Marijuana  . Sexual activity: Never  Other Topics Concern  . Not on file  Social History Narrative  . Not on file   Social Determinants of Health   Financial Resource Strain: Not on file  Food Insecurity: Not on file  Transportation Needs: Not on file  Physical Activity: Not on file  Stress: Not on file  Social Connections: Not on file   Additional Social History:                         Sleep: Good  Appetite:  Fair  Current Medications: Current Facility-Administered Medications  Medication Dose Route Frequency Provider Last Rate Last Admin  . acetaminophen (TYLENOL) tablet 650 mg  650 mg Oral Q6H PRN Briant Cedar, MD      . alum & mag hydroxide-simeth (MAALOX/MYLANTA) 200-200-20 MG/5ML suspension 30 mL  30 mL Oral Q4H PRN Briant Cedar, MD      . atropine 1 % ophthalmic solution 1 drop  1 drop Sublingual Q8H PRN Nelda Marseille, Amy E, MD      . buPROPion [facility] SR) 12 hr tablet 100 mg  100 mg Oral Daily Arthor Captain, MD   100 mg at 01/29/21 0825  . cloZAPine (CLOZARIL) tablet 175 mg  175 mg Oral QHS Arthor Captain, MD   175 mg at 01/28/21 2038  . [START ON 01/30/2021] docusate sodium (COLACE) capsule 100 mg  100 mg Oral BID Arthor Captain, MD      . feeding supplement (ENSURE ENLIVE / ENSURE PLUS) liquid 237 mL  237 mL Oral TID BM Briant Cedar, MD   237 mL at 01/29/21 1003  . hydrOXYzine (ATARAX/VISTARIL) tablet 25 mg  25 mg Oral Q8H PRN Arthor Captain, MD      . OLANZapine zydis (ZYPREXA) disintegrating tablet 5 mg  5 mg Oral Q8H PRN Harlow Asa, MD   5 mg at 01/21/21 1728   And  . LORazepam (ATIVAN)  tablet 1 mg  1 mg Oral PRN Harlow Asa, MD       And  . ziprasidone (GEODON) injection 20 mg  20 mg Intramuscular PRN Nelda Marseille, Amy E, MD      . magnesium hydroxide (MILK OF MAGNESIA) suspension 30 mL  30 mL Oral Daily PRN Briant Cedar, MD      . melatonin tablet 10 mg  10 mg Oral QHS Arthor Captain, MD      . multivitamin with minerals tablet 1 tablet  1 tablet Oral Daily Briant Cedar, MD   1 tablet at 01/29/21 0825  . mupirocin cream (BACTROBAN) 2 %  Topical TID Sharma Covert, MD   1 application at 02/58/52 1620  . neomycin-bacitracin-polymyxin (NEOSPORIN) ointment   Topical BID Harlow Asa, MD   1 application at 77/82/42 1620  . polyethylene glycol (MIRALAX / GLYCOLAX) packet 17 g  17 g Oral Daily Briant Cedar, MD   17 g at 01/29/21 3536    Lab Results:  No results found for this or any previous visit (from the past 48 hour(s)).  Blood Alcohol level:  Lab Results  Component Value Date   ETH <10 12/25/2020   ETH <10 14/43/1540    Metabolic Disorder Labs: Lab Results  Component Value Date   HGBA1C 5.2 01/09/2021   MPG 102.54 01/09/2021   MPG 117 06/28/2020   No results found for: PROLACTIN Lab Results  Component Value Date   CHOL 188 11/18/2020   TRIG 124 11/18/2020   HDL 69 11/18/2020   CHOLHDL 2.7 11/18/2020   VLDL 25 11/18/2020   LDLCALC 94 11/18/2020   LDLCALC 128 (H) 06/28/2020    Physical Findings: AIMS: Facial and Oral Movements Muscles of Facial Expression: None, normal Lips and Perioral Area: None, normal Jaw: None, normal Tongue: None, normal,Extremity Movements Upper (arms, wrists, hands, fingers): None, normal Lower (legs, knees, ankles, toes): None, normal, Trunk Movements Neck, shoulders, hips: None, normal, Overall Severity Severity of abnormal movements (highest score from questions above): None, normal Incapacitation due to abnormal movements: None, normal Patient's awareness of abnormal movements (rate  only patient's report): No Awareness, Dental Status Current problems with teeth and/or dentures?: No Does patient usually wear dentures?: No  CIWA:    COWS:     Musculoskeletal: Strength & Muscle Tone: within normal limits Gait & Station: normal Patient leans: N/A  Psychiatric Specialty Exam:  Presentation  General Appearance: Appropriate for Environment; Fairly Groomed  Eye Contact:Good  Speech:Clear and Coherent; Normal Rate  Speech Volume:Normal  Handedness:Right   Mood and Affect  Mood:Anxious; Depressed (Reports feeling less anxious and less depressed overall)  Affect:Appropriate   Thought Process  Thought Processes:Coherent; Goal Directed; Linear  Descriptions of Associations:Intact  Orientation:Full (Time, Place and Person)  Thought Content:Logical; Rumination (Decreased ruminations; decreased paranoid ideation)  History of Schizophrenia/Schizoaffective disorder:Yes  Duration of Psychotic Symptoms:Greater than six months  Hallucinations:Hallucinations: Other (comment) Description of Auditory Hallucinations: Denies voices.  Ideas of Reference:None  Suicidal Thoughts:Suicidal Thoughts: No  Homicidal Thoughts:Homicidal Thoughts: No   Sensorium  Memory:Immediate Good; Recent Good; Remote Good  Judgment:Fair  Insight:Fair   Executive Functions  Concentration:Good  Attention Span:Good  [place] of Knowledge:Good  Language:Good   Psychomotor Activity  Psychomotor Activity:Psychomotor Activity: Normal   Assets  Assets:Communication Skills; Desire for Improvement; Housing; Resilience; Social Support   Sleep  Sleep:Sleep: Good Number of Hours of Sleep: 6.75    Physical Exam: Physical Exam Vitals and nursing note reviewed.  Constitutional:      General: He is not in acute distress. HENT:     Head: Normocephalic.  Pulmonary:     Effort: Pulmonary effort is normal.  Neurological:     General: No focal deficit  present.     Mental Status: He is alert and oriented to person, place, and time.    Review of Systems  Constitutional: Negative for diaphoresis and fever.  Respiratory: Negative.   Cardiovascular: Negative.   Gastrointestinal: Negative.  Negative for constipation.  Neurological: Negative for tremors and seizures.       Positive for lightheadedness upon standing in the morning  Psychiatric/Behavioral: Positive for  depression. Negative for hallucinations and suicidal ideas. The patient is nervous/anxious. The patient does not have insomnia.    Blood pressure 103/71, pulse (!) 103, temperature 98 F (36.7 C), temperature source Oral, resp. rate 16, height 6' (1.829 m), weight 66.7 kg, SpO2 96 %. Body mass index is 19.94 kg/m.   Treatment Plan Summary: Daily contact with patient to assess and evaluate symptoms and progress in treatment and Medication management   Continue IVC status  Continue every 15-minute observation status.  Encouragedparticipation in group therapy and therapeutic milieu  Encouragedmovement around unit, getting out of bed,going to cafeteria for meals,attempting spending time in day room.   Psychosis -Continue clozapine 175mg  Q PM. CBC,ANCandTROPONINWNLon 01/25/21. -Weekly CBC with WBC and ANCand weekly troponinswith next draw 02/01/21. -Clozapine level ordered with next draw on 02/01/2021. -Weekly EKG performed 01/25/2021 revealed normal sinus rhythm, ventricular rate of 97 and QT/QTc of 356/452.  Depression/anxiety -Discontinue gabapentin to reduce morning sedation -Continue bupropion SR 100mg  daily -Continue Vistaril 25 mg every 8 hours as needed anxiety paren continue monitoring for possible excess anticholinergic side effects while on Clozaril with PRN Vistaril) -Patient would benefit from participation in cognitive behavior therapy and PHP after discharge if he is willing to participate.  Bowel regimen -Decreasedocusate to 100mg   twice daily as patient has been having regular BMs.  Monitor for possible return of constipation with dose decrease. -Continue MOM as needed -Continue MiraLAX daily -Push p.o. fluids -Ambulation and out of bed encouraged  Insomnia -Continuemelatonin 10mg  poQHSstanding dose   Wound Care -- dry dressing daily  -- patient encouraged to attend to ADLs and shower  --Need to clarify with urology whether patient requires suture removal from surgical wounds in groin area  Disposition planning in progress. Patient would benefit from participation in [facility] and outpatient cognitive behavior therapy after discharge if he is willing to participate.  Patient remains on the [facility] transfer waitlist  I certify that inpatient services furnished can reasonably be expected to improve the patient's condition.   Arthor Captain, MD 01/29/2021, 5:02 PM

## 2021-01-29 NOTE — Progress Notes (Addendum)
   01/29/21 1122  Psych Admission Type (Psych Patients Only)  Admission Status Voluntary  Psychosocial Assessment  Patient Complaints Anxiety;Depression  Eye Contact Fair  Facial Expression Sad;Flat  Affect Appropriate to circumstance  Speech Logical/coherent;Soft  Interaction Assertive  Motor Activity Slow  Appearance/Hygiene Unremarkable  Behavior Characteristics Cooperative  Mood Pleasant  Thought Process  Coherency WDL  Content WDL  Delusions None reported or observed  Perception Hallucinations  Hallucination None reported or observed (improved)  Judgment Poor  Confusion None  Danger to Self  Current suicidal ideation? Denies  Danger to Others  Danger to Others None reported or observed  Danger to Others Abnormal  Harmful Behavior to others No threats or harm toward other people  Destructive Behavior No threats or harm toward property   D. Pt presents with an anxious affect/depressed mood. Pt has been isolating today, stayed in bed and did not attend group, despite encouragement from staff. Pt stated that he realizes that he needs to stay out of his room to keep from ruminating on his past, but is continues to isolate. Per pt's self inventory, pt rated his depression, hopelessness and anxiety a 8/9/9, respectively. Pt stated that his goal today is to work on his anxiety by resting, then staying active as much as possible. Pt currently denies SI/HI and AVH  A. Labs and vitals monitored. Pt compliant with medications. Pt supported emotionally and encouraged to express concerns and ask questions.   R. Pt remains safe with 15 minute checks. Will continue POC.

## 2021-01-29 NOTE — Progress Notes (Signed)
12/3020 MD called from [facility] inquiring about when this patient should go to follow-up with Urology.  Reviewed chart for discharge information and relayed to MD.  Also gave Uro on call doctor info to her for further questions. Nino Parsley

## 2021-01-30 NOTE — BHH Counselor (Signed)
CSW contacted [facility] [phone] for possible PHP services.  CSW was told that they will not accept anyone without insurance and have no current IPRS spaces available for this service. CSW will attempt to contact [facility] to schedule these services.

## 2021-01-30 NOTE — Progress Notes (Signed)
Pt attended scheduled unit groups with increased verbal encouragement but did not go off unit for breakfast and lunch. Pt remain guarded with flat affect, depressed mood and is ambulatory in milieu with a slow but steady gait. Pt c/o dizziness earlier this shift "I don't feel good, I just want to rest. Admitted to not drinking enough fluids in the morning "I only ate a little bit of my breakfast too. I can take my ensure now" on initial encounter. Denies SI, HI, AVH and pain when assessed. Reports difficulty falling asleep last night "It just took longer than usual for me to fall asleep".  Support and encouragement offered to pt this shift. All medications given as ordered. Safety checks maintained without self harm gestures or outburst. Fluids encouraged and given throughout this shift. Vitals rechecked WNL. Dressing change as ordered. Pt tolerates all medications, dressing change and meals well without discomfort. Remains safe on unit. Denies dizziness at this time.

## 2021-01-30 NOTE — BHH Counselor (Addendum)
CSW spoke with Mr. Sean Shepherd this morning and asked him to call the [facility] to see about starting services again with them.  CSW provided Mr. Sean Shepherd with the phone number.  CSW checked back in with Mr. Sean Shepherd this afternoon who stated that he did contact the [facility] and was told that he could continue with the [facility] but would be placed an a waitlist for theraputic services.  CSW asked Mr. Sean Shepherd how he was feeling about starting back to services outside the hospital.  Mr. Sean Shepherd stated that he was nervous about starting outpatient services again and would like to be able to ease back into them slowly.  He stated that having to many services at once felt overwhelming.  CSW spoke with Mr. Sean Shepherd about the importance of being in groups and being around people even if it was for short periods.  Mr. Sean Shepherd stated that he understands this and is willing to participate in groups, along with therapy and medication management. CSW then contacted the [facility] to verify that Mr. Sean Shepherd had called the agency and to see if they would be willing to accept Mr. Sean Shepherd for services at this time.  CSW was informed of the same information that Mr. Sean Shepherd had shared and CSW was told that the waitlist at this time for therapy and medication management services would be approximately 6 months since he was previously discharged from the services.  CSW was also told that Mr. Sean Shepherd could contact the agency again to complete a screening for peer support services.  CSW will discuss this option with Mr. Sean Shepherd and his mother and will add the option to his discharge follow up. CSW will attempt to get therapy and medication management services scheduled at another agency for Mr. Sean Shepherd until such time as he is able to return to the [facility].

## 2021-01-30 NOTE — BHH Counselor (Signed)
CSW contacted [facility] to see about scheduling a Partial Hospitalization Program for Mr. Sean Shepherd.  CSW was informed that [facility] does not have PHP services at this time or a CDIOP program.  CSW is not aware of any other PHP programs that Mr. Sean Shepherd is eligible for with no medical insurance.  CSW will continue to seek services that will benefit Mr. Sean Shepherd after discharge.

## 2021-01-30 NOTE — Progress Notes (Signed)
[facility] Group Notes:  (Nursing/MHT/Case Management/Adjunct)  Date:  01/30/2021  Time:  2000 Type of Therapy:  wrap up group  Participation Level:  Active  Participation Quality:  Appropriate, Attentive, Sharing and Supportive  Affect:  Appropriate  Cognitive:  Alert  Insight:  Improving  Engagement in Group:  Engaged  Modes of Intervention:  Clarification, Education and Support  Summary of Progress/Problems: Positive thinking and positive change were discussed. Pt reports liking living alone but needs to reach out to supports when he starts to isolate.   Sean Shepherd 01/30/2021, 9:13 PM

## 2021-01-30 NOTE — Progress Notes (Signed)
   01/30/21 0028  Psych Admission Type (Psych Patients Only)  Admission Status Voluntary  Psychosocial Assessment  Patient Complaints Anxiety  Eye Contact Fair  Facial Expression Sad;Flat  Affect Appropriate to circumstance  Speech Logical/coherent;Soft  Interaction Assertive  Motor Activity Slow  Appearance/Hygiene Unremarkable  Behavior Characteristics Cooperative  Mood Pleasant  Thought Process  Coherency WDL  Content WDL  Delusions None reported or observed  Perception Hallucinations  Hallucination None reported or observed (improved)  Judgment Poor  Confusion None  Danger to Self  Current suicidal ideation? Denies  Danger to Others  Danger to Others None reported or observed  Danger to Others Abnormal  Harmful Behavior to others No threats or harm toward other people  Destructive Behavior No threats or harm toward property

## 2021-01-30 NOTE — Progress Notes (Signed)
Pt participated in a fun activity in group. 

## 2021-01-30 NOTE — Progress Notes (Signed)
   01/30/21 2030  Psych Admission Type (Psych Patients Only)  Admission Status Involuntary  Psychosocial Assessment  Patient Complaints Anxiety  Eye Contact Fair;Brief  Facial Expression Sad;Flat  Affect Anxious  Speech Logical/coherent;Soft  Interaction Minimal  Motor Activity Slow  Appearance/Hygiene Unremarkable  Behavior Characteristics Cooperative;Anxious  Mood Depressed;Anxious  Thought Process  Coherency WDL  Content WDL  Delusions None reported or observed  Perception Hallucinations  Hallucination None reported or observed  Judgment Poor (wants to go home and live alone again)  Confusion None  Danger to Self  Current suicidal ideation? Denies  Danger to Others  Danger to Others None reported or observed   Pt denies SI, HI, AVH and pain. Pt endorses anxiety 8/10 d/t thoughts of the future and social anxiety. Pt endorses racing thoughts that are increasing his anxiety. Pt states that he has had social anxiety his entire life and it was never diagnosed. Pt encouraged to get out more and even walk around the unit instead of lying in bed. Pt encouraged to drink more fluids d/t his orthostatic hypotension in the mornings. Pt verbalized agreement. Pt states he wants to go home when discharged from [facility]. Pt lives alone a mile away from his parents. Pt says he will get out more and go to therapy to avoid a situation like what brought him to [facility] in the first place.

## 2021-01-30 NOTE — Progress Notes (Signed)
[facility] MD Progress Note  01/30/2021 5:10 PM [name]  MRN:  [id number]   Reason for admission: Sean Reiseris a 45 year old male with prior diagnosesof schizoaffectivedisorder versus schizophrenia, depression and past marijuana use who was transferred from [facility] medical unit after prolonged hospitalization for recovery from multiple significant self-inflicted injuries in response to command auditory hallucinations in a suicide attempt.  Objective:  Medical record reviewed.  Case discussed in detail with members of the treatment team.  I met with and evaluated the patient on the unit today for follow-up.  Patient reports that he is no longer experiencing auditory hallucinations.  He at times will find words and sounds that provide him with yes or no questions he has about his ruminative worries about the future.  He reports that he is anxious and fearful of making changes in the future.  He denies SI, AI, HI, PI.  He denies constipation.  He continues to have some mild lightheadedness in the morning and reports experiencing drooling at night but denies other medication side effects.  Patient states that he is anxious about attending groups and meals with others and prefers to remain in his room.  Slept 6.75 hours last night.  No new labs today.  Patient has been taking medications as prescribed.  He remains isolative in his room and has not been attending groups.  He requires much prompting to go to eat meals with others in the cafeteria due to his anxiety about being around other people.  Principal Problem: Schizoaffective disorder, bipolar type ([place]) Diagnosis: Principal Problem:   Schizoaffective disorder, bipolar type ([place]) Active Problems:   Insomnia   Anxiety disorder, unspecified  Total Time spent with patient: 20 minutes  Past Psychiatric History: See admission H&P  Past Medical History:  Past Medical History:  Diagnosis Date  . Anxiety disorder, unspecified  01/10/2021  . Bipolar disorder ([place])   . Depression   . Schizoaffective disorder [facility])     Past Surgical History:  Procedure Laterality Date  . LAPAROTOMY N/A 12/25/2020   Procedure: EXPLORATORY LAPAROTOMY; REPAIR OF GROIN LACERATIONS X2, RIGHT THIGH LACERATION X 2; REPAIR RIGHT NECK AND LEFT NECK LACERATION AND REPAIR OF LEFT WRIST LACERATION;  Surgeon: Georganna Skeans, MD;  Location: [place];  Service: General;  Laterality: N/A;  . NO PAST SURGERIES     Family History: History reviewed. No pertinent family history. Family Psychiatric  History: See admission H&P Social History:  Social History   Substance and Sexual Activity  Alcohol Use Not Currently     Social History   Substance and Sexual Activity  Drug Use Yes  . Types: Marijuana    Social History   Socioeconomic History  . Marital status: Single    Spouse name: Not on file  . Number of children: 0  . Years of education: Not on file  . Highest education level: Not on file  Occupational History  . Not on file  Tobacco Use  . Smoking status: Former Smoker    Quit date: 07/31/2014    Years since quitting: 6.5  . Smokeless tobacco: Never Used  Vaping Use  . Vaping Use: Every day  . Substances: THC, CBD  Substance and Sexual Activity  . Alcohol use: Not Currently  . Drug use: Yes    Types: Marijuana  . Sexual activity: Never  Other Topics Concern  . Not on file  Social History Narrative  . Not on file   Social Determinants of Health   Financial  Resource Strain: Not on file  Food Insecurity: Not on file  Transportation Needs: Not on file  Physical Activity: Not on file  Stress: Not on file  Social Connections: Not on file   Additional Social History:                         Sleep: Good  Appetite:  Fair  Current Medications: Current Facility-Administered Medications  Medication Dose Route Frequency Provider Last Rate Last Admin  . acetaminophen (TYLENOL) tablet 650 mg  650 mg Oral Q6H PRN  Briant Cedar, MD      . alum & mag hydroxide-simeth (MAALOX/MYLANTA) 200-200-20 MG/5ML suspension 30 mL  30 mL Oral Q4H PRN Briant Cedar, MD      . atropine 1 % ophthalmic solution 1 drop  1 drop Sublingual Q8H PRN Nelda Marseille, Amy E, MD      . buPROPion [facility] SR) 12 hr tablet 100 mg  100 mg Oral Daily Arthor Captain, MD   100 mg at 01/30/21 0913  . cloZAPine (CLOZARIL) tablet 175 mg  175 mg Oral QHS Arthor Captain, MD   175 mg at 01/29/21 2040  . docusate sodium (COLACE) capsule 100 mg  100 mg Oral BID Arthor Captain, MD   100 mg at 01/30/21 1645  . feeding supplement (ENSURE ENLIVE / ENSURE PLUS) liquid 237 mL  237 mL Oral TID BM Briant Cedar, MD   237 mL at 01/30/21 1345  . hydrOXYzine (ATARAX/VISTARIL) tablet 25 mg  25 mg Oral Q8H PRN Arthor Captain, MD      . OLANZapine zydis (ZYPREXA) disintegrating tablet 5 mg  5 mg Oral Q8H PRN Harlow Asa, MD   5 mg at 01/21/21 1728   And  . LORazepam (ATIVAN) tablet 1 mg  1 mg Oral PRN Nelda Marseille, Amy E, MD       And  . ziprasidone (GEODON) injection 20 mg  20 mg Intramuscular PRN Nelda Marseille, Amy E, MD      . magnesium hydroxide (MILK OF MAGNESIA) suspension 30 mL  30 mL Oral Daily PRN Briant Cedar, MD      . melatonin tablet 10 mg  10 mg Oral QHS Arthor Captain, MD   10 mg at 01/29/21 2040  . multivitamin with minerals tablet 1 tablet  1 tablet Oral Daily Briant Cedar, MD   1 tablet at 01/30/21 0913  . mupirocin cream (BACTROBAN) 2 %   Topical TID Sharma Covert, MD   1 application at 25/42/70 1257  . neomycin-bacitracin-polymyxin (NEOSPORIN) ointment   Topical BID Harlow Asa, MD   Given at 01/30/21 [phone]  . polyethylene glycol (MIRALAX / GLYCOLAX) packet 17 g  17 g Oral Daily Briant Cedar, MD   17 g at 01/30/21 6283    Lab Results:  No results found for this or any previous visit (from the past 48 hour(s)).  Blood Alcohol level:  Lab Results  Component Value Date   ETH  <10 12/25/2020   ETH <10 15/17/6160    Metabolic Disorder Labs: Lab Results  Component Value Date   HGBA1C 5.2 01/09/2021   MPG 102.54 01/09/2021   MPG 117 06/28/2020   No results found for: PROLACTIN Lab Results  Component Value Date   CHOL 188 11/18/2020   TRIG 124 11/18/2020   HDL 69 11/18/2020   CHOLHDL 2.7 11/18/2020   VLDL 25 11/18/2020   LDLCALC 94 11/18/2020  LDLCALC 128 (H) 06/28/2020    Physical Findings: AIMS: Facial and Oral Movements Muscles of Facial Expression: None, normal Lips and Perioral Area: None, normal Jaw: None, normal Tongue: None, normal,Extremity Movements Upper (arms, wrists, hands, fingers): None, normal Lower (legs, knees, ankles, toes): None, normal, Trunk Movements Neck, shoulders, hips: None, normal, Overall Severity Severity of abnormal movements (highest score from questions above): None, normal Incapacitation due to abnormal movements: None, normal Patient's awareness of abnormal movements (rate only patient's report): No Awareness, Dental Status Current problems with teeth and/or dentures?: No Does patient usually wear dentures?: No  CIWA:    COWS:     Musculoskeletal: Strength & Muscle Tone: within normal limits Gait & Station: normal Patient leans: N/A  Psychiatric Specialty Exam:  Presentation  General Appearance: Appropriate for Environment; Fairly Groomed  Eye Contact:Good  Speech:Clear and Coherent; Normal Rate  Speech Volume:Normal  Handedness:Right   Mood and Affect  Mood:Anxious  Affect:Congruent   Thought Process  Thought Processes:Coherent; Goal Directed  Descriptions of Associations:Intact  Orientation:Full (Time, Place and Person)  Thought Content:Logical; Rumination (Ruminative worry about the future)  History of Schizophrenia/Schizoaffective disorder:Yes  Duration of Psychotic Symptoms:Greater than six months  Hallucinations:Hallucinations: None Description of Auditory Hallucinations:  Denies voices.  Ideas of Reference:None  Suicidal Thoughts:Suicidal Thoughts: No  Homicidal Thoughts:Homicidal Thoughts: No   Sensorium  Memory:Immediate Good; Recent Good; Remote Good  Judgment:Fair  Insight:Fair   Executive Functions  Concentration:Good  Attention Span:Good  [place] of Knowledge:Good  Language:Good   Psychomotor Activity  Psychomotor Activity:Psychomotor Activity: Normal   Assets  Assets:Communication Skills; Desire for Improvement; Housing; Resilience; Social Support   Sleep  Sleep:Sleep: Good Number of Hours of Sleep: 6.75    Physical Exam: Physical Exam Vitals and nursing note reviewed.  Constitutional:      General: He is not in acute distress. HENT:     Head: Normocephalic.  Pulmonary:     Effort: Pulmonary effort is normal.  Neurological:     General: No focal deficit present.     Mental Status: He is alert and oriented to person, place, and time.    Review of Systems  Constitutional: Negative for diaphoresis and fever.  Respiratory: Negative.   Cardiovascular: Negative.   Gastrointestinal: Negative.  Negative for constipation.  Neurological: Negative for tremors and seizures.       Positive for lightheadedness upon standing in the morning  Psychiatric/Behavioral: Positive for depression. Negative for hallucinations and suicidal ideas. The patient is nervous/anxious. The patient does not have insomnia.    Blood pressure 121/74, pulse 97, temperature 97.9 F (36.6 C), temperature source Oral, resp. rate 16, height 6' (1.829 m), weight 66.7 kg, SpO2 97 %. Body mass index is 19.94 kg/m.   Treatment Plan Summary: Daily contact with patient to assess and evaluate symptoms and progress in treatment and Medication management   Continue IVC status  Continue every 15-minute observation status.  Encouragedparticipation in group therapy and therapeutic milieu  Encouragedmovement around unit, getting out of  bed,going to cafeteria for meals,attempting spending time in day room.   Psychosis -Continue clozapine 14m Q PM. CBC,ANCandTROPONINWNLon 01/25/21. -Weekly CBC with WBC and ANCand weekly troponinswith next draw 02/01/21. -Clozapine level ordered with next draw on 02/01/2021. -Weekly EKG performed 01/25/2021 revealed normal sinus rhythm, ventricular rate of 97 and QT/QTc of 356/452.  Depression/anxiety -Continue bupropion SR 1035mdaily -Continue Vistaril 25 mg every 8 hours as needed anxiety paren continue monitoring for possible excess anticholinergic side effects while on Clozaril  with PRN Vistaril) -Patient would benefit from participation in cognitive behavior therapy and PHP after discharge if he is willing to participate.  His social anxiety and avoidance of social situations has been lifelong.  Bowel regimen -Continuedocusate 165m once daily.   -Continue MOM as needed -Continue MiraLAX daily -Push p.o. fluids -Ambulation and out of bed encouraged  Insomnia -Continuemelatonin 126mpoQHSstanding dose   Wound Care -- dry dressing daily  -- patient encouraged to attend to ADLs and shower  --Need to clarify with urology whether patient requires suture removal from surgical wounds in groin area  Disposition planning in progress. [facility] if he is willing to participate.  [facility]  I certify that inpatient services furnished can reasonably be expected to improve the patient's condition.   MaArthor CaptainMD 01/30/2021, 5:10 PM

## 2021-01-30 NOTE — Progress Notes (Signed)
Adult Psychoeducational Group Note  Date:  01/30/2021 Time:  10:39 AM  Group Topic/Focus:  Goals Group:   The focus of this group is to help patients establish daily goals to achieve during treatment and discuss how the patient can incorporate goal setting into their daily lives to aide in recovery.  Participation Level:  Active  Participation Quality:  Appropriate  Affect:  Appropriate  Cognitive:  Alert  Insight: Appropriate  Engagement in Group:  Engaged  Modes of Intervention:  Discussion  Additional Comments:  Pt attended group and participated in discussion.  Jaelynn Currier R Lejon Afzal 01/30/2021, 10:39 AM

## 2021-01-30 NOTE — Progress Notes (Signed)
Recreation Therapy Notes  Animal-Assisted Activity (AAA) Program Checklist/Progress Notes Patient Eligibility Criteria Checklist & Daily Group note for Rec Tx Intervention  Date: 5.31.22 Time: 1430 Location: [address]   AAA/T Program Assumption of Risk Form signed by Engineer, production or Parent Legal Guardian YES   Patient is free of allergies or severe asthma  YES  Patient reports no fear of animals  YES  Patient reports no history of cruelty to animals YES   Patient understands his/her participation is voluntary YES  Patient washes hands before animal contact  YES   Patient washes hands after animal contact  YES  Education: Charity fundraiser, Appropriate Animal Interaction   Education Outcome: Acknowledges understanding/In group clarification offered/Needs additional education.   Clinical Observations/Feedback:  Pt did not attend activity.    Caroll Rancher, LRT/CTRS    Caroll Rancher A 01/30/2021 3:48 PM

## 2021-01-31 NOTE — BHH Counselor (Signed)
CSW spoke with Mrs. Gilliam Hawkes (Mother) who states that she is nervous about her son being released from the hospital.  Mrs. Fischler states that she is concerned that she will not be able to take care of her son and provide him with enough support.  Mrs. Kingsley states that her son will be living with her for at least 2 weeks after discharge.  She states that she will also be taking him to all of his appointments to make sure that he goes.  Mrs. Couser states that she is also concerned that her son will hurt himself again and she continues to see the initial event replayed in her mind.  CSW advised that Mrs. Bates also seek counseling for herself.  Mrs. Corvin states that she would like her son to be seen at [facility] after discharge.  She states that she would also like the CSW to look at [facility] for additional treatment options.  CSW reviewed all current treatment options that have been scheduled for the patient.  Mrs. Favila states that [facility] is within walking distance from both their homes and this is one reason she would like some services to be scheduled there.  CSW also discussed with Mrs. Cid about Herbie Drape and Payee options and stated that she would like to be provided with information about these services.  Mrs. Hagan states that she will also be talking to her family to help her prepare for her son's return home.

## 2021-01-31 NOTE — Progress Notes (Signed)
Recreation Therapy Notes  Date: 6.1.22 Time: 0930 Location: [address] Day[address]  Group Topic: Stress Management   Goal Area(s) Addresses:  Patient will actively participate in stress management techniques presented during session.  Patient will successfully identify benefit of practicing stress management post d/c.   Intervention: Guided exercise with ambient sound and script  Activity :Guided Imagery  LRT read a beach visualization script to patients.  Patients were asked to participate in the technique introduced during introduction. Patients were given suggestions of ways to access scripts post d/c and encouraged to explore Youtube and other apps available on smartphones, tablets, and computers.   Education:  Stress Management, Discharge Planning.   Education Outcome: Acknowledges education  Clinical Observations/Feedback: Patient did not attend group session.   Cele Mote, LRT/CTRS         [place] Teters A 01/31/2021 12:35 PM 

## 2021-01-31 NOTE — Plan of Care (Signed)
I made direct phone contact with patient's mother Sean Shepherd [phone]) today and we spoke for approximately 25 minutes. We discussed patient's current status, treatment, medications, anticipated outcomes from treatment, and anticipated aftercare plans.  Manuel Dall was provided an opportunity ask questions and verbalize concerns.  Her questions were answered.  She stated understanding of information discussed and expressed appreciation for the call.

## 2021-01-31 NOTE — Progress Notes (Signed)
[facility] MD Progress Note  01/31/2021 5:37 PM Sean Shepherd  MRN:  [id number]   Reason for admission: Sean Reiseris a 45 year old male with prior diagnosesof schizoaffectivedisorder versus schizophrenia, depression and past marijuana use who was transferred from [facility] medical unit after prolonged hospitalization for recovery from multiple significant self-inflicted injuries in response to command auditory hallucinations in a suicide attempt.  Objective:  Medical record reviewed.  Case discussed in detail with members of the treatment team.  I met with and evaluated the patient on the unit today for follow-up.  Patient initially reports "the voices are back but it might be stress from having a roommate."  When asked to elaborate he states that he is not actually hearing voices but rather he finds himself searching for meaning in environmental sounds.  The patient states that he keeps having memories and images of what he did in his suicide attempt prior to admission and also is afraid about the changes he will have to make after discharge and what his life will be like.  When asked to provide a specific example he states that he knows he will have difficulty leaving the house after discharge and worries that he will have an anxiety attack or that people will stare at him.  We discussed the importance of engaging in cognitive behavior therapy to help address this anxiety in addition to taking medications.  Patient stated understanding.  Patient states he is sleeping well although in the morning he awakens and forgets that he is made a suicide attempt and experiences anxiety when he again realizes the injuries he inflicted upon himself.  He denies suicidal ideation intent or plan.  He reports that he has been eating lunch and dinner in the dining hall and that although it is uncomfortable he does not feel others are staring at him.  Patient states that he has had social anxiety for his entire  life and that he feels more comfortable when he is alone.  He has been trying to walk on the unit and has been attending at least 2 groups per day.  He is experiencing some mild lightheadedness in the morning but denies other medication side effects.  The patient slept 6.25 hours last night.  No new labs today.  Vital signs this morning include BP of 124/78 sitting and 100/63 standing, pulse of 96 sitting and 103 standing, respirations of 18, O2 sat of 99%.  Chart notes indicate that the patient did not attend groups this morning.  Principal Problem: Schizoaffective disorder, bipolar type ([place]) Diagnosis: Principal Problem:   Schizoaffective disorder, bipolar type ([place]) Active Problems:   Insomnia   Anxiety disorder, unspecified  Total Time spent with patient: 20 minutes  Past Psychiatric History: See admission H&P  Past Medical History:  Past Medical History:  Diagnosis Date  . Anxiety disorder, unspecified 01/10/2021  . Bipolar disorder ([place])   . Depression   . Schizoaffective disorder [facility])     Past Surgical History:  Procedure Laterality Date  . LAPAROTOMY N/A 12/25/2020   Procedure: EXPLORATORY LAPAROTOMY; REPAIR OF GROIN LACERATIONS X2, RIGHT THIGH LACERATION X 2; REPAIR RIGHT NECK AND LEFT NECK LACERATION AND REPAIR OF LEFT WRIST LACERATION;  Surgeon: Georganna Skeans, MD;  Location: [place];  Service: General;  Laterality: N/A;  . NO PAST SURGERIES     Family History: History reviewed. No pertinent family history. Family Psychiatric  History: See admission H&P Social History:  Social History   Substance and Sexual Activity  Alcohol  Use Not Currently     Social History   Substance and Sexual Activity  Drug Use Yes  . Types: Marijuana    Social History   Socioeconomic History  . Marital status: Single    Spouse name: Not on file  . Number of children: 0  . Years of education: Not on file  . Highest education level: Not on file  Occupational History  . Not on file   Tobacco Use  . Smoking status: Former Smoker    Quit date: 07/31/2014    Years since quitting: 6.5  . Smokeless tobacco: Never Used  Vaping Use  . Vaping Use: Every day  . Substances: THC, CBD  Substance and Sexual Activity  . Alcohol use: Not Currently  . Drug use: Yes    Types: Marijuana  . Sexual activity: Never  Other Topics Concern  . Not on file  Social History Narrative  . Not on file   Social Determinants of Health   Financial Resource Strain: Not on file  Food Insecurity: Not on file  Transportation Needs: Not on file  Physical Activity: Not on file  Stress: Not on file  Social Connections: Not on file   Additional Social History:                         Sleep: Good  Appetite:  Fair  Current Medications: Current Facility-Administered Medications  Medication Dose Route Frequency Provider Last Rate Last Admin  . acetaminophen (TYLENOL) tablet 650 mg  650 mg Oral Q6H PRN Briant Cedar, MD      . alum & mag hydroxide-simeth (MAALOX/MYLANTA) 200-200-20 MG/5ML suspension 30 mL  30 mL Oral Q4H PRN Briant Cedar, MD      . atropine 1 % ophthalmic solution 1 drop  1 drop Sublingual Q8H PRN Nelda Marseille, Amy E, MD      . buPROPion [facility] SR) 12 hr tablet 100 mg  100 mg Oral Daily Arthor Captain, MD   100 mg at 01/31/21 0807  . cloZAPine (CLOZARIL) tablet 175 mg  175 mg Oral QHS Arthor Captain, MD   175 mg at 01/30/21 2137  . docusate sodium (COLACE) capsule 100 mg  100 mg Oral BID Arthor Captain, MD   100 mg at 01/31/21 0806  . feeding supplement (ENSURE ENLIVE / ENSURE PLUS) liquid 237 mL  237 mL Oral TID BM Briant Cedar, MD   237 mL at 01/31/21 1028  . hydrOXYzine (ATARAX/VISTARIL) tablet 25 mg  25 mg Oral Q8H PRN Arthor Captain, MD      . OLANZapine zydis (ZYPREXA) disintegrating tablet 5 mg  5 mg Oral Q8H PRN Harlow Asa, MD   5 mg at 01/21/21 1728   And  . LORazepam (ATIVAN) tablet 1 mg  1 mg Oral PRN Nelda Marseille, Amy E,  MD       And  . ziprasidone (GEODON) injection 20 mg  20 mg Intramuscular PRN Nelda Marseille, Amy E, MD      . magnesium hydroxide (MILK OF MAGNESIA) suspension 30 mL  30 mL Oral Daily PRN Briant Cedar, MD      . melatonin tablet 10 mg  10 mg Oral QHS Arthor Captain, MD   10 mg at 01/30/21 2137  . multivitamin with minerals tablet 1 tablet  1 tablet Oral Daily Briant Cedar, MD   1 tablet at 01/31/21 [phone]  . mupirocin cream (BACTROBAN) 2 %  Topical TID Sharma Covert, MD   1 application at 51/02/58 1257  . neomycin-bacitracin-polymyxin (NEOSPORIN) ointment   Topical BID Harlow Asa, MD   Given at 01/30/21 [phone]  . polyethylene glycol (MIRALAX / GLYCOLAX) packet 17 g  17 g Oral Daily Briant Cedar, MD   17 g at 01/30/21 8242    Lab Results:  No results found for this or any previous visit (from the past 48 hour(s)).  Blood Alcohol level:  Lab Results  Component Value Date   ETH <10 12/25/2020   ETH <10 35/36/1443    Metabolic Disorder Labs: Lab Results  Component Value Date   HGBA1C 5.2 01/09/2021   MPG 102.54 01/09/2021   MPG 117 06/28/2020   No results found for: PROLACTIN Lab Results  Component Value Date   CHOL 188 11/18/2020   TRIG 124 11/18/2020   HDL 69 11/18/2020   CHOLHDL 2.7 11/18/2020   VLDL 25 11/18/2020   LDLCALC 94 11/18/2020   LDLCALC 128 (H) 06/28/2020    Physical Findings: AIMS: Facial and Oral Movements Muscles of Facial Expression: None, normal Lips and Perioral Area: None, normal Jaw: None, normal Tongue: None, normal,Extremity Movements Upper (arms, wrists, hands, fingers): None, normal Lower (legs, knees, ankles, toes): None, normal, Trunk Movements Neck, shoulders, hips: None, normal, Overall Severity Severity of abnormal movements (highest score from questions above): None, normal Incapacitation due to abnormal movements: None, normal Patient's awareness of abnormal movements (rate only patient's report): No  Awareness, Dental Status Current problems with teeth and/or dentures?: No Does patient usually wear dentures?: No  CIWA:    COWS:     Musculoskeletal: Strength & Muscle Tone: within normal limits Gait & Station: normal Patient leans: N/A  Psychiatric Specialty Exam:  Presentation  General Appearance: Appropriate for Environment; Fairly Groomed  Eye Contact:Good  Speech:Clear and Coherent; Normal Rate  Speech Volume:Normal  Handedness:Right   Mood and Affect  Mood:Anxious  Affect:Congruent   Thought Process  Thought Processes:Coherent; Goal Directed  Descriptions of Associations:Intact  Orientation:Full (Time, Place and Person)  Thought Content:Rumination; Perseveration; Other (comment) (Intrusive thoughts of past trauma of suicide attempt; ruminative worries about engaging in social interactions and about the future)  History of Schizophrenia/Schizoaffective disorder:Yes  Duration of Psychotic Symptoms:Greater than six months  Hallucinations:Hallucinations: Other (comment) (Patient initially states the voices have returned but then clarifies that he is searching to find meaning in sounds in the environment)  Ideas of Reference:None  Suicidal Thoughts:Suicidal Thoughts: No  Homicidal Thoughts:Homicidal Thoughts: No   Sensorium  Memory:Immediate Good; Recent Good; Remote Good  Judgment:Fair  Insight:Fair   Executive Functions  Concentration:Good  Attention Span:Good  [place] of Knowledge:Good  Language:Good   Psychomotor Activity  Psychomotor Activity:Psychomotor Activity: Normal   Assets  Assets:Communication Skills; Desire for Improvement; Housing; Resilience; Social Support   Sleep  Sleep:Sleep: Good Number of Hours of Sleep: 6.25    Physical Exam: Physical Exam Vitals and nursing note reviewed.  Constitutional:      General: He is not in acute distress. HENT:     Head: Normocephalic.  Pulmonary:     Effort:  Pulmonary effort is normal.  Neurological:     General: No focal deficit present.     Mental Status: He is alert and oriented to person, place, and time.    Review of Systems  Constitutional: Negative for diaphoresis and fever.  Respiratory: Negative.   Cardiovascular: Negative.   Gastrointestinal: Negative.  Negative for constipation.  Neurological: Negative for  tremors and seizures.       Positive for lightheadedness upon standing in the morning  Psychiatric/Behavioral: Positive for depression. Negative for hallucinations and suicidal ideas. The patient is nervous/anxious. The patient does not have insomnia.    Blood pressure 100/63, pulse (!) 103, temperature 97.9 F (36.6 C), temperature source Oral, resp. rate 18, height 6' (1.829 m), weight 67.1 kg, SpO2 99 %. Body mass index is 20.07 kg/m.   Treatment Plan Summary: Daily contact with patient to assess and evaluate symptoms and progress in treatment and Medication management   Continue IVC status  Continue every 15-minute observation status.  Encouragedparticipation in group therapy and therapeutic milieu  Encouragedmovement around unit, getting out of bed,going to cafeteria for meals,attempting spending time in day room.   Psychosis -Continue clozapine 166m Q PM. CBC,ANCandTROPONINWNLon 01/25/21. -Weekly CBC with WBC and ANCwith next draw 02/01/21. -Clozapine level ordered with next draw on 02/01/2021. -Weekly EKG performed 01/25/2021 revealed normal sinus rhythm, ventricular rate of 97 and QT/QTc of 356/452.  Depression/anxiety -Continue bupropion SR 1051mdaily -Continue Vistaril 25 mg every 8 hours as needed anxiety paren continue monitoring for possible excess anticholinergic side effects while on Clozaril with PRN Vistaril) -Patient would benefit from participation in cognitive behavior therapy and PHP after discharge if he is willing to participate.  His social anxiety and avoidance of social  situations has been lifelong.  Bowel regimen -Continuedocusate 10055mnce daily.   -Continue MOM as needed -Continue MiraLAX daily -Push p.o. fluids -Ambulation and out of bed encouraged  Insomnia -Continuemelatonin 43m44mQHSstanding dose   Wound Care -- dry dressing daily  -- patient encouraged to attend to ADLs and shower  --Need to clarify with urology whether patient requires suture removal from surgical wounds in groin area --[facility]. [facility] would benefit from participation in PHP [facility] outpatient cognitive behavior therapy after discharge if he is willing to participate.  [facility] so patient can continue to receive clozapine and get weekly CBCs ordered.  Social work is investigating ACT team options for patient.  [facility]    MartArthor Captain 01/31/2021, 5:37 PM

## 2021-01-31 NOTE — [facility] Group Notes (Signed)
Topic: Feelings   Due to the acuity and complex discharge plans, group was not held. Patient was provided therapeutic worksheets and asked to meet with CSW as needed. 

## 2021-01-31 NOTE — Progress Notes (Signed)
   01/31/21 0815  Psych Admission Type (Psych Patients Only)  Admission Status Involuntary  Psychosocial Assessment  Patient Complaints Anxiety  Eye Contact Brief  Facial Expression Sad;Flat  Affect Anxious  Speech Logical/coherent;Soft  Interaction Guarded;Minimal  Motor Activity Slow  Appearance/Hygiene Unremarkable  Behavior Characteristics Cooperative;Anxious  Mood Depressed;Anxious  Thought Process  Coherency WDL  Content WDL  Delusions None reported or observed  Perception Hallucinations  Hallucination None reported or observed  Judgment Impaired  Confusion None  Danger to Self  Current suicidal ideation? Denies  Danger to Others  Danger to Others None reported or observed

## 2021-01-31 NOTE — Progress Notes (Signed)
Pt got up to have VS assessed. Pt encouraged to down to breakfast, but still refused. Pt in room in bed, says he needs to rest.

## 2021-01-31 NOTE — Progress Notes (Signed)
[facility] Group Notes:  (Nursing/MHT/Case Management/Adjunct)  Date:  01/31/2021  Time:  11:40 PM  Type of Therapy:  warp up   Participation Level:  Did Not Attend  Participation Quality:  DNA  Affect:  DNA  Cognitive:  DNA  Insight:  None  Engagement in Group:  DNA  Modes of Intervention:  DNA  Summary of Progress/Problems: Did not attend group.  Annell Greening Grimes 01/31/2021, 11:40 PM

## 2021-01-31 NOTE — Tx Team (Signed)
Interdisciplinary Treatment and Diagnostic Plan Update  01/31/2021 Time of Session: 9:50am Sean Shepherd MRN: [id number]  Principal Diagnosis: Schizoaffective disorder, bipolar type (HCC)  Secondary Diagnoses: Principal Problem:   Schizoaffective disorder, bipolar type (HCC) Active Problems:   Insomnia   Anxiety disorder, unspecified   Current Medications:  Current Facility-Administered Medications  Medication Dose Route Frequency Provider Last Rate Last Admin  . acetaminophen (TYLENOL) tablet 650 mg  650 mg Oral Q6H PRN Lauro Franklin, MD      . alum & mag hydroxide-simeth (MAALOX/MYLANTA) 200-200-20 MG/5ML suspension 30 mL  30 mL Oral Q4H PRN Lauro Franklin, MD      . atropine 1 % ophthalmic solution 1 drop  1 drop Sublingual Q8H PRN Mason Jim, Amy E, MD      . buPROPion [facility]) 12 hr tablet 100 mg  100 mg Oral Daily Claudie Revering, MD   100 mg at 01/31/21 0807  . cloZAPine (CLOZARIL) tablet 175 mg  175 mg Oral QHS Claudie Revering, MD   175 mg at 01/30/21 2137  . docusate sodium (COLACE) capsule 100 mg  100 mg Oral BID Claudie Revering, MD   100 mg at 01/31/21 0806  . feeding supplement (ENSURE ENLIVE / ENSURE PLUS) liquid 237 mL  237 mL Oral TID BM Lauro Franklin, MD   237 mL at 01/31/21 1028  . hydrOXYzine (ATARAX/VISTARIL) tablet 25 mg  25 mg Oral Q8H PRN Claudie Revering, MD      . OLANZapine zydis (ZYPREXA) disintegrating tablet 5 mg  5 mg Oral Q8H PRN Comer Locket, MD   5 mg at 01/21/21 1728   And  . LORazepam (ATIVAN) tablet 1 mg  1 mg Oral PRN Mason Jim, Amy E, MD       And  . ziprasidone (GEODON) injection 20 mg  20 mg Intramuscular PRN Mason Jim, Amy E, MD      . magnesium hydroxide (MILK OF MAGNESIA) suspension 30 mL  30 mL Oral Daily PRN Lauro Franklin, MD      . melatonin tablet 10 mg  10 mg Oral QHS Claudie Revering, MD   10 mg at 01/30/21 2137  . multivitamin with minerals tablet 1 tablet  1 tablet Oral Daily Lauro Franklin, MD   1 tablet at 01/31/21 [phone]  . mupirocin cream (BACTROBAN) 2 %   Topical TID Antonieta Pert, MD   1 application at 01/30/21 1257  . neomycin-bacitracin-polymyxin (NEOSPORIN) ointment   Topical BID Comer Locket, MD   Given at 01/30/21 [phone]  . polyethylene glycol (MIRALAX / GLYCOLAX) packet 17 g  17 g Oral Daily Lauro Franklin, MD   17 g at 01/30/21 4782   PTA Medications: Medications Prior to Admission  Medication Sig Dispense Refill Last Dose  . acetaminophen (TYLENOL) 500 MG tablet Take 2 tablets (1,000 mg total) by mouth every 6 (six) hours as needed. 30 tablet 0   . benztropine (COGENTIN) 1 MG tablet Take 1 tablet (1 mg total) by mouth at bedtime.     . chlorproMAZINE (THORAZINE) 100 MG tablet Take 1 tablet (100 mg total) by mouth daily.     . chlorproMAZINE (THORAZINE) 200 MG tablet Take 1 tablet (200 mg total) by mouth at bedtime.     . docusate sodium (COLACE) 100 MG capsule Take 1 capsule (100 mg total) by mouth 2 (two) times daily. 10 capsule 0   . gabapentin (NEURONTIN) 300 MG capsule Take 1 capsule (  300 mg total) by mouth 3 (three) times daily.     . methocarbamol (ROBAXIN) 500 MG tablet Take 2 tablets (1,000 mg total) by mouth every 8 (eight) hours as needed for muscle spasms.     . mirtazapine (REMERON) 30 MG tablet Take 1 tablet (30 mg total) by mouth at bedtime.     . Multiple Vitamin (MULTIVITAMIN WITH MINERALS) TABS tablet Take 1 tablet by mouth daily.     . Oxycodone HCl 10 MG TABS Take 0.5-1 tablets (5-10 mg total) by mouth every 4 (four) hours as needed. 15 tablet 0   . paliperidone (INVEGA) 9 MG 24 hr tablet Take 1 tablet (9 mg total) by mouth at bedtime.       Patient Stressors: Health problems Legal issue Traumatic event  Patient Strengths: Ability for insight Capable of independent living Communication skills Motivation for treatment/growth Supportive family/friends  Treatment Modalities: Medication Management, Group therapy, Case management,   1 to 1 session with clinician, Psychoeducation, Recreational therapy.   Physician Treatment Plan for Primary Diagnosis: Schizoaffective disorder, bipolar type (HCC) Long Term Goal(s): Improvement in symptoms so as ready for discharge Improvement in symptoms so as ready for discharge   Short Term Goals: Ability to identify changes in lifestyle to reduce recurrence of condition will improve Ability to verbalize feelings will improve Ability to disclose and discuss suicidal ideas Ability to demonstrate self-control will improve Ability to identify and develop effective coping behaviors will improve Compliance with prescribed medications will improve Ability to identify triggers associated with substance abuse/mental health issues will improve Ability to identify changes in lifestyle to reduce recurrence of condition will improve Ability to verbalize feelings will improve Ability to disclose and discuss suicidal ideas Ability to demonstrate self-control will improve Ability to identify and develop effective coping behaviors will improve Compliance with prescribed medications will improve Ability to identify triggers associated with substance abuse/mental health issues will improve  Medication Management: Evaluate patient's response, side effects, and tolerance of medication regimen.  Therapeutic Interventions: 1 to 1 sessions, Unit Group sessions and Medication administration.  Evaluation of Outcomes: Progressing  Physician Treatment Plan for Secondary Diagnosis: Principal Problem:   Schizoaffective disorder, bipolar type (HCC) Active Problems:   Insomnia   Anxiety disorder, unspecified  Long Term Goal(s): Improvement in symptoms so as ready for discharge Improvement in symptoms so as ready for discharge   Short Term Goals: Ability to identify changes in lifestyle to reduce recurrence of condition will improve Ability to verbalize feelings will improve Ability to disclose and  discuss suicidal ideas Ability to demonstrate self-control will improve Ability to identify and develop effective coping behaviors will improve Compliance with prescribed medications will improve Ability to identify triggers associated with substance abuse/mental health issues will improve Ability to identify changes in lifestyle to reduce recurrence of condition will improve Ability to verbalize feelings will improve Ability to disclose and discuss suicidal ideas Ability to demonstrate self-control will improve Ability to identify and develop effective coping behaviors will improve Compliance with prescribed medications will improve Ability to identify triggers associated with substance abuse/mental health issues will improve     Medication Management: Evaluate patient's response, side effects, and tolerance of medication regimen.  Therapeutic Interventions: 1 to 1 sessions, Unit Group sessions and Medication administration.  Evaluation of Outcomes: Progressing   RN Treatment Plan for Primary Diagnosis: Schizoaffective disorder, bipolar type (HCC) Long Term Goal(s): Knowledge of disease and therapeutic regimen to maintain health will improve  Short Term Goals: Ability to remain  free from injury will improve, Ability to verbalize frustration and anger appropriately will improve, Ability to demonstrate self-control, Ability to identify and develop effective coping behaviors will improve and Compliance with prescribed medications will improve  Medication Management: RN will administer medications as ordered by provider, will assess and evaluate patient's response and provide education to patient for prescribed medication. RN will report any adverse and/or side effects to prescribing provider.  Therapeutic Interventions: 1 on 1 counseling sessions, Psychoeducation, Medication administration, Evaluate responses to treatment, Monitor vital signs and CBGs as ordered, Perform/monitor CIWA, COWS,  AIMS and Fall Risk screenings as ordered, Perform wound care treatments as ordered.  Evaluation of Outcomes: Progressing   LCSW Treatment Plan for Primary Diagnosis: Schizoaffective disorder, bipolar type (HCC) Long Term Goal(s): Safe transition to appropriate next level of care at discharge, Engage patient in therapeutic group addressing interpersonal concerns.  Short Term Goals: Engage patient in aftercare planning with referrals and resources, Increase social support, Increase ability to appropriately verbalize feelings, Identify triggers associated with mental health/substance abuse issues and Increase skills for wellness and recovery  Therapeutic Interventions: Assess for all discharge needs, 1 to 1 time with Social worker, Explore available resources and support systems, Assess for adequacy in community support network, Educate family and significant other(s) on suicide prevention, Complete Psychosocial Assessment, Interpersonal group therapy.  Evaluation of Outcomes: Progressing   Progress in Treatment: Attending groups: Yes. and No. Participating in groups: Yes. and No. Taking medication as prescribed: Yes. Toleration medication: Yes. Family/Significant other contact made: Yes, individual(s) contacted:  mother Patient understands diagnosis: Yes. Discussing patient identified problems/goals with staff: Yes. Medical problems stabilized or resolved: Yes. Denies suicidal/homicidal ideation: Yes. Issues/concerns per patient self-inventory: No.    New problem(s) identified: No, Describe:  none  New Short Term/Long Term Goal(s): medication stabilization, elimination of SI thoughts, development of comprehensive mental wellness plan.   Patient Goals:  Did not attend  Discharge Plan or Barriers: Patient will follow up with an outpatient provider for therapy and medication management.  Patient will be scheduled to attend mental health groups and peer support services. At discharge the  patient will return to his mother's home.  Reason for Continuation of Hospitalization: Delusions  Depression Hallucinations Medical Issues Medication stabilization  Estimated Length of Stay: 3-5 days  Attendees: Patient: Did not attend  01/31/2021  Physician: Clifton Custard, MD  01/31/2021   Nursing:  01/31/2021   RN Care Manager: 01/31/2021   Social Worker: Melba Coon, LCSWA  01/31/2021   Recreational Therapist:  01/31/2021   Other:  01/31/2021   Other:  01/31/2021   Other: 01/31/2021     Scribe for Treatment Team: Aram Beecham, LCSWA 01/31/2021 1:20 PM

## 2021-01-31 NOTE — [facility] Group Notes (Signed)
The focus of this group is to help patients establish daily goals to achieve during treatment and discuss how the patient can incorporate goal setting into their daily lives to aide in recovery.  Pt did not attend group 

## 2021-01-31 NOTE — Progress Notes (Addendum)
   01/31/21 2030  Psych Admission Type (Psych Patients Only)  Admission Status Involuntary  Psychosocial Assessment  Patient Complaints Anxiety;Depression  Eye Contact Fair  Facial Expression Sad;Flat  Affect Anxious  Speech Logical/coherent;Soft  Interaction Minimal  Motor Activity Slow  Appearance/Hygiene Unremarkable  Behavior Characteristics Cooperative;Anxious  Mood Anxious;Depressed  Thought Process  Coherency WDL  Content Preoccupation (preoccupied with the future after discharge)  Delusions None reported or observed  Perception Hallucinations  Hallucination Auditory (trying to decipher the noises that he hears through the ringing in his left ear)  Judgment Poor  Confusion None  Danger to Self  Current suicidal ideation? Denies  Danger to Others  Danger to Others None reported or observed   Pt seen in his room in the bed.  Pt denies SI, HI. Pt endorses AH as his mind trying to decipher the noises that he hears through the ringing in his left ear. Pt endorses pain in his right ear and his abdominal area from movement. Pt refused any pain medication. Pt also noted some bleeding when he dabs his penile stump after urination. Area examined and is slightly irritated. No sign of bleeding. Skin edges still closed. Right ear area is reddened from touching it but no skin breakdown. Pt says it feels itchy. Encouraged to moisturize area after his shower.  Pt rates anxiety 9/10 and depression 6-7/10. Pt still worried about the future and possibly discharging next week. Pt says he will stay with his parents for a while.

## 2021-02-01 ENCOUNTER — Ambulatory Visit: Payer: Self-pay | Admitting: Internal Medicine

## 2021-02-01 DIAGNOSIS — F250 Schizoaffective disorder, bipolar type: Secondary | ICD-10-CM | POA: Diagnosis not present

## 2021-02-01 LAB — CBC WITH DIFFERENTIAL/PLATELET
Abs Immature Granulocytes: 0.01 10*3/uL (ref 0.00–0.07)
Basophils Absolute: 0 10*3/uL (ref 0.0–0.1)
Basophils Relative: 1 %
Eosinophils Absolute: 0.2 10*3/uL (ref 0.0–0.5)
Eosinophils Relative: 3 %
HCT: 37.4 % — ABNORMAL LOW (ref 39.0–52.0)
Hemoglobin: 12.3 g/dL — ABNORMAL LOW (ref 13.0–17.0)
Immature Granulocytes: 0 %
Lymphocytes Relative: 21 %
Lymphs Abs: 1.3 10*3/uL (ref 0.7–4.0)
MCH: 29.6 pg (ref 26.0–34.0)
MCHC: 32.9 g/dL (ref 30.0–36.0)
MCV: 89.9 fL (ref 80.0–100.0)
Monocytes Absolute: 0.5 10*3/uL (ref 0.1–1.0)
Monocytes Relative: 8 %
Neutro Abs: 4.4 10*3/uL (ref 1.7–7.7)
Neutrophils Relative %: 67 %
Platelets: 297 10*3/uL (ref 150–400)
RBC: 4.16 MIL/uL — ABNORMAL LOW (ref 4.22–5.81)
RDW: 12.8 % (ref 11.5–15.5)
WBC: 6.4 10*3/uL (ref 4.0–10.5)
nRBC: 0 % (ref 0.0–0.2)

## 2021-02-01 MED ORDER — CLOZAPINE 100 MG PO TABS
200.0000 mg | ORAL_TABLET | Freq: Every day | ORAL | Status: DC
  Administered 2021-02-01 – 2021-02-03 (×3): 200 mg via ORAL
  Filled 2021-02-01 (×4): qty 2
  Filled 2021-02-01: qty 20

## 2021-02-01 NOTE — Progress Notes (Signed)
1800 pt has been pleasant and cooperative this shift.   He denies any physical physical problems and  His abdominal dressing is intact. The wound is well approximated, with minimal, dried, serous exudate.   Abdominal dressing changed with topical antibiotics but refused ear dressing, stating that he didn't need it.  He is contracting for safety.   02/01/21 1100  Psych Admission Type (Psych Patients Only)  Admission Status Involuntary  Psychosocial Assessment  Patient Complaints Anxiety;Agitation;Depression;Restlessness  Eye Contact Brief  Facial Expression Anxious;Sullen  Affect Anxious;Irritable;Blunted  Speech Logical/coherent  Interaction Assertive;Guarded  Motor Activity Other (Comment) (wnl)  Appearance/Hygiene In scrubs  Behavior Characteristics Cooperative;Anxious;Guarded;Irritable  Mood Anxious  Thought Process  Coherency WDL  Content WDL  Delusions None reported or observed  Perception WDL  Hallucination None reported or observed  Judgment Poor  Confusion None  Danger to Self  Current suicidal ideation? Denies  Self-Injurious Behavior Some self-injurious ideation observed or expressed.  No lethal plan expressed   Agreement Not to Harm Self Yes  Description of Agreement contracts for safety  Danger to Others  Danger to Others None reported or observed

## 2021-02-01 NOTE — Progress Notes (Signed)
Patient did not attend group.

## 2021-02-01 NOTE — Progress Notes (Signed)
Pt did not attend goals group. 

## 2021-02-01 NOTE — Progress Notes (Signed)
Patient did not attend Nutrition group. 

## 2021-02-01 NOTE — Progress Notes (Signed)
[facility] MD Progress Note  02/01/2021 11:23 AM Sean [facility]  MRN:  [id number]   Reason for admission: Sean Reiseris a 45 year old male with prior diagnosesof schizoaffectivedisorder versus schizophrenia, depression and past marijuana use who was transferred from [facility] after prolonged hospitalization for recovery from multiple significant self-inflicted injuries in response to command auditory hallucinations in a suicide attempt.  Objective:  Medical record reviewed.  Case discussed in detail with members of the treatment team.  Medical students met with patient and reviewed with NP and MD. Sean Shepherd was in group prior to our interview. He states he is anxious today and getting nervous about discharge and moving forward with life after suicide attempt. He feels that he will be safest at his parents' house to start and has plans for how to spend his time (yoga classes and pottery with mom, walks) but remains nervous. He states he continues to find meaning in noises in the environment, yes/no answers to his racing thoughts. He notes that going to the cafeteria for meals has been a good distraction from his anxiety and racing thoughts, but this is still an anxiety-inducing experience for him. He denies AH, VH, HI, but does continue to endorse some passive SI without intent or a plan to harm himself. He denies delusions or paranoia about surveillance that has previously been noted, but he is concerned this paranoia will return upon discharge. He notes that he is tired most of the day despite sleeping well and requests that the clozapine be given earlier in the evening (8PM) to help with daytime drowsiness. He states that he still has some dizziness as well and that it is intense at times, to the point he has visual blackouts and feels like he is about to faint. This makes it harder for him to go to the cafeteria in the mornings for breakfast. Again encouraged him to get up slowly  from supine and sitting and to continue going to cafeteria for meals and groups throughout the day. He seems more amenable to this than previous days and is aware that his isolative patterns are problematic; he seems moderately committed to preventing this pattern from continuing when he returns home. Nursing notes reviewed and indicate he has been attending groups and moving around the unit more. No vital signs charted this morning, Sean Shepherd states his BP was ~70/50 this morning.  There was a discussion in rounds this morning about increasing Clozaril to 260m for continued psychotic symptoms and for cost effectiveness given lack of health insurance, [name] amenable to this plan. CBC differential this morning continues to uptrend, with Hgb 12.3, WBC 6.4, ANC 4.4 indicating good tolerance of current dose, so increasing Clozaril dosage should not have adverse hematologic effects.   Principal Problem: Schizoaffective disorder, bipolar type ([place] Diagnosis: Principal Problem:   Schizoaffective disorder, bipolar type ([place] Problems:   Insomnia   Anxiety disorder, unspecified  Total Time spent with patient: 20 minutes  Past Psychiatric History: See admission H&P  Past Medical History:  Past Medical History:  Diagnosis Date  . Anxiety disorder, unspecified 01/10/2021  . Bipolar disorder ([name]   . Depression   . Schizoaffective disorder ([place] [facility]     Past Surgical History:  Procedure Laterality Date  . LAPAROTOMY N/A 12/25/2020   Procedure: EXPLORATORY LAPAROTOMY; REPAIR OF GROIN LACERATIONS X2, RIGHT THIGH LACERATION X 2; REPAIR RIGHT NECK AND LEFT NECK LACERATION AND REPAIR OF LEFT WRIST LACERATION;  Surgeon: TGeorganna Skeans MD;  [facility]: [facility]  Service: General;  Laterality: N/A;  . NO PAST SURGERIES     Family History: History reviewed. No pertinent family history. Family Psychiatric  History: See admission H&P Social History:  Social History   Substance and Sexual Activity   Alcohol Use Not Currently     Social History   Substance and Sexual Activity  Drug Use Yes  . Types: Marijuana    Social History   Socioeconomic History  . Marital status: Single    Spouse name: Not on file  . Number of children: 0  . Years of education: Not on file  . Highest education level: Not on file  Occupational History  . Not on file  Tobacco Use  . Smoking status: Former Smoker    Quit date: 07/31/2014    Years since quitting: 6.5  . Smokeless tobacco: Never Used  Vaping Use  . Vaping Use: Every day  . Substances: THC, CBD  Substance and Sexual Activity  . Alcohol use: Not Currently  . Drug use: Yes    Types: Marijuana  . Sexual activity: Never  Other Topics Concern  . Not on file  Social History Narrative  . Not on file   Social Determinants of Health   Financial Resource Strain: Not on file  Food Insecurity: Not on file  Transportation Needs: Not on file  Physical Activity: Not on file  Stress: Not on file  Social Connections: Not on file   Additional Social History:                         Sleep: Good  Appetite:  Fair  Current Medications: Current Facility-Administered Medications  Medication Dose Route Frequency Provider Last Rate Last Admin  . acetaminophen (TYLENOL) tablet 650 mg  650 mg Oral Q6H PRN Briant Cedar, MD      . alum & mag hydroxide-simeth (MAALOX/MYLANTA) 200-200-20 MG/5ML suspension 30 mL  30 mL Oral Q4H PRN Briant Cedar, MD      . atropine 1 % ophthalmic solution 1 drop  1 drop Sublingual Q8H PRN Nelda Marseille, Amy E, MD      . buPROPion [facility] SR) 12 hr tablet 100 mg  100 mg Oral Daily Arthor Captain, MD   100 mg at 02/01/21 1050  . cloZAPine (CLOZARIL) tablet 175 mg  175 mg Oral QHS Arthor Captain, MD   175 mg at 01/31/21 2101  . docusate sodium (COLACE) capsule 100 mg  100 mg Oral BID Arthor Captain, MD   100 mg at 02/01/21 1050  . feeding supplement (ENSURE ENLIVE / ENSURE PLUS) liquid 237  mL  237 mL Oral TID BM Briant Cedar, MD   237 mL at 02/01/21 1051  . hydrOXYzine (ATARAX/VISTARIL) tablet 25 mg  25 mg Oral Q8H PRN Arthor Captain, MD      . OLANZapine zydis (ZYPREXA) disintegrating tablet 5 mg  5 mg Oral Q8H PRN Harlow Asa, MD   5 mg at 01/21/21 1728   And  . LORazepam (ATIVAN) tablet 1 mg  1 mg Oral PRN Harlow Asa, MD       And  . ziprasidone (GEODON) injection 20 mg  20 mg Intramuscular PRN Nelda Marseille, Amy E, MD      . magnesium hydroxide (MILK OF MAGNESIA) suspension 30 mL  30 mL Oral Daily PRN Briant Cedar, MD      . melatonin tablet 10 mg  10 mg Oral QHS Jeneen Rinks,  Forbes Cellar, MD   10 mg at 01/31/21 2101  . multivitamin with minerals tablet 1 tablet  1 tablet Oral Daily Briant Cedar, MD   1 tablet at 02/01/21 1050  . mupirocin cream (BACTROBAN) 2 %   Topical TID Sharma Covert, MD   Given at 01/31/21 1850  . neomycin-bacitracin-polymyxin (NEOSPORIN) ointment   Topical BID Harlow Asa, MD   Given at 01/30/21 [phone]  . polyethylene glycol (MIRALAX / GLYCOLAX) packet 17 g  17 g Oral Daily Briant Cedar, MD   17 g at 01/30/21 0913    Lab Results:  Results for orders placed or performed during the Shepherd encounter of 01/09/21 (from the past 48 hour(s))  CBC with Differential/Platelet     Status: Abnormal   Collection Time: 02/01/21  6:21 AM  Result Value Ref Range   WBC 6.4 4.0 - 10.5 K/uL   RBC 4.16 (L) 4.22 - 5.81 MIL/uL   Hemoglobin 12.3 (L) 13.0 - 17.0 g/dL   HCT 37.4 (L) 39.0 - 52.0 %   MCV 89.9 80.0 - 100.0 fL   MCH 29.6 26.0 - 34.0 pg   MCHC 32.9 30.0 - 36.0 g/dL   RDW 12.8 11.5 - 15.5 %   Platelets 297 150 - 400 K/uL   nRBC 0.0 0.0 - 0.2 %   Neutrophils Relative % 67 %   Neutro Abs 4.4 1.7 - 7.7 K/uL   Lymphocytes Relative 21 %   Lymphs Abs 1.3 0.7 - 4.0 K/uL   Monocytes Relative 8 %   Monocytes Absolute 0.5 0.1 - 1.0 K/uL   Eosinophils Relative 3 %   Eosinophils Absolute 0.2 0.0 - 0.5 K/uL   Basophils  Relative 1 %   Basophils Absolute 0.0 0.0 - 0.1 K/uL   Immature Granulocytes 0 %   Abs Immature Granulocytes 0.01 0.00 - 0.07 K/uL    Comment: Performed at [facility], [place] [address], [place], [place] [address]    Blood Alcohol level:  Lab Results  Component Value Date   ETH <10 12/25/2020   ETH <10 75/06/2584    Metabolic Disorder Labs: Lab Results  Component Value Date   HGBA1C 5.2 01/09/2021   MPG 102.54 01/09/2021   MPG 117 06/28/2020   No results found for: PROLACTIN Lab Results  Component Value Date   CHOL 188 11/18/2020   TRIG 124 11/18/2020   HDL 69 11/18/2020   CHOLHDL 2.7 11/18/2020   VLDL 25 11/18/2020   LDLCALC 94 11/18/2020   LDLCALC 128 (H) 06/28/2020    Physical Findings: AIMS: Facial and Oral Movements Muscles of Facial Expression: None, normal Lips and Perioral Area: None, normal Jaw: None, normal Tongue: None, normal,Extremity Movements Upper (arms, wrists, hands, fingers): None, normal Lower (legs, knees, ankles, toes): None, normal, Trunk Movements Neck, shoulders, hips: None, normal, Overall Severity Severity of abnormal movements (highest score from questions above): None, normal Incapacitation due to abnormal movements: None, normal Patient's awareness of abnormal movements (rate only patient's report): No Awareness, Dental Status Current problems with teeth and/or dentures?: No Does patient usually wear dentures?: No  CIWA:    COWS:     Musculoskeletal: Strength & Muscle Tone: within normal limits Gait & Station: normal Patient leans: N/A  Psychiatric Specialty Exam:  Presentation  General Appearance: Appropriate for Environment; Casual; Fairly Groomed  Eye Contact:Fair  Speech:Clear and Coherent; Normal Rate  Speech Volume:Normal  Handedness:Right   Mood and Affect  Mood:Anxious  Affect:Appropriate; Congruent; Restricted  Thought Process  Thought Processes:Coherent; Goal Directed;  Linear  Descriptions of Associations:Intact  Orientation:Full (Time, Place and Person)  Thought Content:Rumination  History of Schizophrenia/Schizoaffective disorder:Yes  Duration of Psychotic Symptoms:Greater than six months  Hallucinations:Hallucinations: Other (comment) (Patient initially states the voices have returned but then clarifies that he is searching to find meaning in sounds in the environment)  Ideas of Reference:Other (comment) (continues to find meaning in environmental noises--yes/no messages)  Suicidal Thoughts:Suicidal Thoughts: Yes, Passive SI Passive Intent and/or Plan: Without Intent; Without Plan  Homicidal Thoughts:Homicidal Thoughts: No   Sensorium  Memory:Immediate Good; Recent Good  Judgment:Fair  Insight:Fair   Executive Functions  Concentration:Good  Attention Span:Good  Recall:Good  Fund of Knowledge:Good  Language:Good   Psychomotor Activity  Psychomotor Activity:Psychomotor Activity: Normal   Assets  Assets:Social Support; Desire for Improvement; Communication Skills   Sleep  Sleep:Sleep: Good Number of Hours of Sleep: 6    Physical Exam: Physical Exam Vitals and nursing note reviewed.  Constitutional:      General: He is not in acute distress. HENT:     Head: Normocephalic.  Pulmonary:     Effort: Pulmonary effort is normal.  Neurological:     General: No focal deficit present.     Mental Status: He is alert and oriented to person, place, and time.    Review of Systems  Constitutional: Negative for diaphoresis and fever.  Respiratory: Negative.   Cardiovascular: Negative.   Gastrointestinal: Negative.  Negative for constipation.  Genitourinary: Negative for dysuria and hematuria.       Chart review indicates some bleeding from penile stump yesterday--pt says this is not present today  Neurological: Positive for dizziness. Negative for tremors and seizures.       Positive for lightheadedness upon standing in  the morning, states this is intense to the point of feeling like fainting and having visual blackouts at times   Psychiatric/Behavioral: Positive for depression. Negative for hallucinations and suicidal ideas. The patient is nervous/anxious. The patient does not have insomnia.    Blood pressure 100/63, pulse (!) 103, temperature 97.9 F (36.6 C), temperature source Oral, resp. rate 18, height 6' (1.829 m), weight 67.6 kg, SpO2 99 %. Body mass index is 20.21 kg/m.   Treatment Plan Summary: Daily contact with patient to assess and evaluate symptoms and progress in treatment and Medication management   Continue IVC status  Continue every 15-minute observation status.  Encouragedparticipation in group therapy and therapeutic milieu  Encouragedmovement around unit, getting out of bed,going to cafeteria for meals,attempting spending time in day room.   Psychosis -Increase clozapine to 260m Q PM. Moved dose time to 8PM per pt request due to AM tiredness. CBC,ANC WNLtoday. Troponin normal 01/25/21.   -Weekly CBC with WBC and ANCwith next draw 02/08/21. -Clozapine level drawn on 02/01/2021 and pending, expect level return in 1 week. -Weekly EKG performed 01/25/2021 revealed normal sinus rhythm, ventricular rate of 97 and QT/QTc of 356/452.  Depression/anxiety -Continue bupropion SR 1048mdaily -Continue Vistaril 25 mg every 8 hours as needed anxiety paren continue monitoring for possible excess anticholinergic side effects while on Clozaril with PRN Vistaril) -Patient would benefit from participation in cognitive behavior therapy and PHP after discharge if he is willing to participate.  His social anxiety and avoidance of social situations has been lifelong.  Bowel regimen -Continuedocusate 10038mnce daily.   -Continue MOM as needed -Continue MiraLAX daily -Push p.o. fluids -Ambulation and out of bed encouraged  Insomnia -Continuemelatonin 31m64mQHSstanding dose  Wound Care -- dry dressing daily  -- patient encouraged to attend to ADLs and shower  --Need to clarify with urology whether patient requires suture removal from surgical wounds in groin area --Social work is attempting to schedule urology consult appointment at [facility] in progress.  Social work was able to arrange follow-up appointment at [facility] for within a week after discharge so patient can continue to receive clozapine and get weekly CBCs ordered.  Social work is investigating ACT team options for patient, outpatient therapy and groups, and reconstructive urology F/U at [facility].  Patient remains on the [facility] transfer waitlist   Maxwell Marion, Medical Student 02/01/2021, 11:23 AM   Case discussed and plan agreed upon as outlined above.  The current plan for the patient is to be discharged on 02/04/2021.  He will be returned to his home with his mother on that date.  He is to be can a intensive program starting on 02/05/2021.  Arrangements have been made at this point for the patient to have his blood work done as well as renewal of his clozapine until a formal arrangement for continuous psychiatric treatment can be put into place.  Agree with increasing clozapine to 200 mg p.o. nightly.  This will improve compliance as well as hopefully stabilize his mood and adjust his psychotic symptoms.

## 2021-02-02 LAB — CLOZAPINE (CLOZARIL)
Clozapine Lvl: 235 ng/mL — ABNORMAL LOW (ref 350–650)
NorClozapine: 91 ng/mL
Total(Cloz+Norcloz): 326 ng/mL

## 2021-02-02 NOTE — Progress Notes (Addendum)
   02/02/21 2000  Psych Admission Type (Psych Patients Only)  Admission Status Involuntary  Psychosocial Assessment  Patient Complaints Anxiety;Depression  Eye Contact Brief  Facial Expression Anxious;Flat;Sad  Affect Anxious;Appropriate to circumstance;Depressed  Speech Logical/coherent;Soft  Interaction Assertive;Minimal  Motor Activity Slow  Appearance/Hygiene Unremarkable  Behavior Characteristics Cooperative;Anxious  Mood Depressed;Anxious  Thought Process  Coherency WDL  Content WDL  Delusions None reported or observed  Perception WDL  Hallucination None reported or observed  Judgment Poor  Confusion None  Danger to Self  Current suicidal ideation? Denies  Danger to Others  Danger to Others None reported or observed   Pt seen in bed. Pt denies SI, HI, AVH and pain. Pt very anxious (9/10) about being discharged Sunday. He talked to his parents and they know he will be staying with them. "I am worried about the future and what changes I will need to make. I will be going to a day program starting Monday at [facility]."  Pt encouraged to take his treatment one day at a time and that it is a good thing he will be around his parents. They can encourage him and give him the space to improve in a familiar environment. Pt has no other complaints.

## 2021-02-02 NOTE — Progress Notes (Signed)
Pt received PRN Vistaril 25 mg PO at 1437 as ordered for anxiety 9/10. Rates his depression 6/10 and hopelessness 8/10. Per pt current stressor is being "scared going home and being less isolative. The doctor said I will go home on Sunday".  Reports poor sleep last night "problem falling asleep with fair appetite. Goal this shift is to "Think positively and let bad thoughts go". Pt's affect remains flat, he's still guarded, isolative to room earlier this shift but does maintained a fair eye contact and forwards on conversations with prompts. Pt did not attend morning groups despite multiple prompts but he was engaged in noon group with CSW and went off unit with peers as well.  Emotional support and encouragement provided to pt this shift. Safety checks maintained at Q 15 minutes intervals without self harm gestures. All medications given with verbal education and effects monitored.  Pt reports relief from anxiety when reassessed at  1535.Tolerates all medications and meals well when offered. Denies concerns at this time. Safety maintained on and off unit.

## 2021-02-02 NOTE — Progress Notes (Signed)
[facility] Group Notes:  (Nursing/MHT/Case Management/Adjunct)  Date:  02/02/2021  Time:  8:52 PM  Type of Therapy:  Group Therapy  Participation Level:  Did Not Attend  Participation Quality:  NA  Affect:  NA  Cognitive: NA  Insight: NA  Engagement in Group: NA  Modes of Intervention:  NA  Summary of Progress/Problems:  Sean [facility] 02/02/2021, 8:52 PM

## 2021-02-02 NOTE — [facility] Group Notes (Signed)
[facility] LCSW Group Therapy Note   Type of Therapy and Topic:  Group Therapy:  Feelings About Hospitalization  Participation Level:  Active   Description of Group This process group involved patients discussing their feelings related to being hospitalized, as well as the benefits they see to being in the hospital.  These feelings and benefits were itemized.  The group then brainstormed specific ways in which they could seek those same benefits when they discharge and return home.  Therapeutic Goals 1. Patient will identify and describe positive and negative feelings related to hospitalization 2. Patient will verbalize benefits of hospitalization to themselves personally 3. Patients will brainstorm together ways they can obtain similar benefits in the outpatient setting, identify barriers to wellness and possible solutions  Summary of Patient Progress:  Sean Shepherd states that today he is worried and nervous about the changes he will have to make when he is discharged.  Sean Shepherd accepted the worksheet that were provided and joined in discussion with his peers.   Therapeutic Modalities Cognitive Behavioral Therapy Motivational Interviewing

## 2021-02-02 NOTE — Progress Notes (Signed)
Pt did not attend goals group. 

## 2021-02-02 NOTE — Progress Notes (Signed)
[facility] MD Progress Note  02/02/2021 6:36 PM Livingston Denner  MRN:  [id number]   Reason for admission: Thadius Reiseris a 45 year old male with prior diagnosesof schizoaffectivedisorder versus schizophrenia, depression and past marijuana use who was transferred from [facility] medical unit after prolonged hospitalization for recovery from multiple significant self-inflicted injuries in response to command auditory hallucinations in a suicide attempt.  Objective:  Medical record reviewed.  Case discussed in detail with members of the treatment team.  I met with and evaluated the patient on the unit today for follow-up.  Patient denies auditory hallucinations or paranoia.  He denies suicidal ideation.  He has occasional thoughts that it would be easier not to be alive but states that these are occurring less frequently.  Patient describes his mood as anxious.  He continues to have intrusive thoughts of past trauma and some anxiety about making changes to his life in the future but states [facility] and knows he will be able to participate there.  Patient was able to state that having a roommate in the hospital has not been as difficult as he worried it would be and he is no longer anxious about it.  He reports fair sleep and good appetite.  He has less lightheadedness in the morning but still feels a bit tired.  He denies constipation.  He denies other medication side effects.  The patient slept 5.25 hours last night.  The patient has been attending some groups.  He has been taking his meals in the dining room with peers.  Principal Problem: Schizoaffective disorder, bipolar type ([place]) Diagnosis: Principal Problem:   Schizoaffective disorder, bipolar type ([place]) Active Problems:   Insomnia   Anxiety disorder, unspecified  Total Time spent with patient: 20 minutes  Past Psychiatric History: See admission H&P  Past Medical History:  Past Medical History:   Diagnosis Date  . Anxiety disorder, unspecified 01/10/2021  . Bipolar disorder ([place])   . Depression   . Schizoaffective disorder [facility])     Past Surgical History:  Procedure Laterality Date  . LAPAROTOMY N/A 12/25/2020   Procedure: EXPLORATORY LAPAROTOMY; REPAIR OF GROIN LACERATIONS X2, RIGHT THIGH LACERATION X 2; REPAIR RIGHT NECK AND LEFT NECK LACERATION AND REPAIR OF LEFT WRIST LACERATION;  Surgeon: Georganna Skeans, MD;  Location: [place];  Service: General;  Laterality: N/A;  . NO PAST SURGERIES     Family History: History reviewed. No pertinent family history. Family Psychiatric  History: See admission H&P Social History:  Social History   Substance and Sexual Activity  Alcohol Use Not Currently     Social History   Substance and Sexual Activity  Drug Use Yes  . Types: Marijuana    Social History   Socioeconomic History  . Marital status: Single    Spouse name: Not on file  . Number of children: 0  . Years of education: Not on file  . Highest education level: Not on file  Occupational History  . Not on file  Tobacco Use  . Smoking status: Former Smoker    Quit date: 07/31/2014    Years since quitting: 6.5  . Smokeless tobacco: Never Used  Vaping Use  . Vaping Use: Every day  . Substances: THC, CBD  Substance and Sexual Activity  . Alcohol use: Not Currently  . Drug use: Yes    Types: Marijuana  . Sexual activity: Never  Other Topics Concern  . Not on file  Social History Narrative  .  Not on file   Social Determinants of Health   Financial Resource Strain: Not on file  Food Insecurity: Not on file  Transportation Needs: Not on file  Physical Activity: Not on file  Stress: Not on file  Social Connections: Not on file   Additional Social History:                         Sleep: Good  Appetite:  Fair  Current Medications: Current Facility-Administered Medications  Medication Dose Route Frequency Provider Last Rate Last Admin  .  acetaminophen (TYLENOL) tablet 650 mg  650 mg Oral Q6H PRN Briant Cedar, MD      . alum & mag hydroxide-simeth (MAALOX/MYLANTA) 200-200-20 MG/5ML suspension 30 mL  30 mL Oral Q4H PRN Briant Cedar, MD      . atropine 1 % ophthalmic solution 1 drop  1 drop Sublingual Q8H PRN Nelda Marseille, Amy E, MD      . buPROPion [facility] SR) 12 hr tablet 100 mg  100 mg Oral Daily Arthor Captain, MD   100 mg at 02/02/21 0906  . cloZAPine (CLOZARIL) tablet 200 mg  200 mg Oral QHS Sharma Covert, MD   200 mg at 02/01/21 2112  . docusate sodium (COLACE) capsule 100 mg  100 mg Oral BID Arthor Captain, MD   100 mg at 02/02/21 1719  . feeding supplement (ENSURE ENLIVE / ENSURE PLUS) liquid 237 mL  237 mL Oral TID BM Briant Cedar, MD   237 mL at 02/02/21 1436  . hydrOXYzine (ATARAX/VISTARIL) tablet 25 mg  25 mg Oral Q8H PRN Arthor Captain, MD   25 mg at 02/02/21 1437  . OLANZapine zydis (ZYPREXA) disintegrating tablet 5 mg  5 mg Oral Q8H PRN Harlow Asa, MD   5 mg at 01/21/21 1728   And  . LORazepam (ATIVAN) tablet 1 mg  1 mg Oral PRN Nelda Marseille, Amy E, MD       And  . ziprasidone (GEODON) injection 20 mg  20 mg Intramuscular PRN Nelda Marseille, Amy E, MD      . magnesium hydroxide (MILK OF MAGNESIA) suspension 30 mL  30 mL Oral Daily PRN Briant Cedar, MD      . melatonin tablet 10 mg  10 mg Oral QHS Arthor Captain, MD   10 mg at 02/01/21 2112  . multivitamin with minerals tablet 1 tablet  1 tablet Oral Daily Briant Cedar, MD   1 tablet at 02/02/21 7001  . mupirocin cream (BACTROBAN) 2 %   Topical TID Sharma Covert, MD   1 application at 74/94/49 [phone]  . neomycin-bacitracin-polymyxin (NEOSPORIN) ointment   Topical BID Harlow Asa, MD   1 application at 16/38/46 1720  . polyethylene glycol (MIRALAX / GLYCOLAX) packet 17 g  17 g Oral Daily Briant Cedar, MD   17 g at 02/02/21 6599    Lab Results:  Results for orders placed or performed during the  hospital encounter of 01/09/21 (from the past 48 hour(s))  CBC with Differential/Platelet     Status: Abnormal   Collection Time: 02/01/21  6:21 AM  Result Value Ref Range   WBC 6.4 4.0 - 10.5 K/uL   RBC 4.16 (L) 4.22 - 5.81 MIL/uL   Hemoglobin 12.3 (L) 13.0 - 17.0 g/dL   HCT 37.4 (L) 39.0 - 52.0 %   MCV 89.9 80.0 - 100.0 fL   MCH 29.6 26.0 -  34.0 pg   MCHC 32.9 30.0 - 36.0 g/dL   RDW 12.8 11.5 - 15.5 %   Platelets 297 150 - 400 K/uL   nRBC 0.0 0.0 - 0.2 %   Neutrophils Relative % 67 %   Neutro Abs 4.4 1.7 - 7.7 K/uL   Lymphocytes Relative 21 %   Lymphs Abs 1.3 0.7 - 4.0 K/uL   Monocytes Relative 8 %   Monocytes Absolute 0.5 0.1 - 1.0 K/uL   Eosinophils Relative 3 %   Eosinophils Absolute 0.2 0.0 - 0.5 K/uL   Basophils Relative 1 %   Basophils Absolute 0.0 0.0 - 0.1 K/uL   Immature Granulocytes 0 %   Abs Immature Granulocytes 0.01 0.00 - 0.07 K/uL    Comment: Performed at [facility] [place] [address], [place], [place] [address]    Blood Alcohol level:  Lab Results  Component Value Date   ETH <10 12/25/2020   ETH <10 73/71/0626    Metabolic Disorder Labs: Lab Results  Component Value Date   HGBA1C 5.2 01/09/2021   MPG 102.54 01/09/2021   MPG 117 06/28/2020   No results found for: PROLACTIN Lab Results  Component Value Date   CHOL 188 11/18/2020   TRIG 124 11/18/2020   HDL 69 11/18/2020   CHOLHDL 2.7 11/18/2020   VLDL 25 11/18/2020   LDLCALC 94 11/18/2020   LDLCALC 128 (H) 06/28/2020    Physical Findings: AIMS: Facial and Oral Movements Muscles of Facial Expression: None, normal Lips and Perioral Area: None, normal Jaw: None, normal Tongue: None, normal,Extremity Movements Upper (arms, wrists, hands, fingers): None, normal Lower (legs, knees, ankles, toes): None, normal, Trunk Movements Neck, shoulders, hips: None, normal, Overall Severity Severity of abnormal movements (highest score from questions above): None, normal Incapacitation  due to abnormal movements: None, normal Patient's awareness of abnormal movements (rate only patient's report): No Awareness, Dental Status Current problems with teeth and/or dentures?: No Does patient usually wear dentures?: No  CIWA:    COWS:     Musculoskeletal: Strength & Muscle Tone: within normal limits Gait & Station: normal Patient leans: N/A  Psychiatric Specialty Exam:  Presentation  General Appearance: Appropriate for Environment; Fairly Groomed  Eye Contact:Good  Speech:Clear and Coherent; Normal Rate  Speech Volume:Normal  Handedness:Right   Mood and Affect  Mood:Anxious  Affect:Congruent   Thought Process  Thought Processes:Coherent; Goal Directed; Linear  Descriptions of Associations:Intact  Orientation:Full (Time, Place and Person)  Thought Content:Rumination  History of Schizophrenia/Schizoaffective disorder:Yes  Duration of Psychotic Symptoms:Greater than six months  Hallucinations:Hallucinations: None  Ideas of Reference:None  Suicidal Thoughts:Suicidal Thoughts: Yes, Passive (Reports these are less frequent) SI Passive Intent and/or Plan: Without Intent; Without Plan  Homicidal Thoughts:Homicidal Thoughts: No   Sensorium  Memory:Immediate Good; Recent Good  Judgment:Fair  Insight:Fair   Executive Functions  Concentration:Good  Attention Span:Good  Recall:Good  Fund of Knowledge:Good  Language:Good   Psychomotor Activity  Psychomotor Activity:Psychomotor Activity: Normal   Assets  Assets:Communication Skills; Desire for Improvement; Resilience; Housing; Social Support   Sleep  Sleep:Sleep: Good Number of Hours of Sleep: 5.25    Physical Exam: Physical Exam Vitals and nursing note reviewed.  Constitutional:      General: He is not in acute distress. HENT:     Head: Normocephalic.  Pulmonary:     Effort: Pulmonary effort is normal.  Neurological:     General: No focal deficit present.     Mental  Status: He is alert and oriented to  person, place, and time.    Review of Systems  Constitutional: Negative for diaphoresis and fever.  Respiratory: Negative.   Cardiovascular: Negative.   Gastrointestinal: Negative.  Negative for constipation.  Neurological: Negative for tremors and seizures.       Positive for lightheadedness upon standing in the morning  Psychiatric/Behavioral: Positive for depression. Negative for hallucinations and suicidal ideas. The patient is nervous/anxious. The patient does not have insomnia.    Blood pressure 102/73, pulse 99, temperature 97.9 F (36.6 C), temperature source Oral, resp. rate 18, height 6' (1.829 m), weight 68 kg, SpO2 99 %. Body mass index is 20.34 kg/m.   Treatment Plan Summary: Daily contact with patient to assess and evaluate symptoms and progress in treatment and Medication management   Continue IVC status  Continue every 15-minute observation status.  Encouragedparticipation in group therapy and therapeutic milieu  Encouragedmovement around unit, getting out of bed,going to cafeteria for meals,attempting spending time in day room.   Psychosis -Continue clozapine 274m Q PM. CBC,ANCwere WNL on 02/01/21. -Weekly CBC with WBC and ANC.  Ordered CBC with differential to be drawn on the morning of 02/04/2021 in preparation for discharge so patient may be dispensed maximum clozapine supply. -Clozapine level was drawn on 02/01/2021 and results are pending.   Depression/anxiety -Continue bupropion SR 1053mdaily -Continue Vistaril 25 mg every 8 hours as needed anxiety paren continue monitoring for possible excess anticholinergic side effects while on Clozaril with PRN Vistaril) -Patient would benefit from participation in cognitive behavior therapy and PHP after discharge if he is willing to participate.  His social anxiety and avoidance of social situations has been lifelong.  Bowel regimen -Continuedocusate 10035mnce  daily.   -Continue MOM as needed -Continue MiraLAX daily -Push p.o. fluids -Ambulation and out of bed encouraged  Insomnia -Continuemelatonin 47m43mQHSstanding dose   Wound Care -- dry dressing daily  -- patient encouraged to attend to ADLs and shower  --Need to clarify with urology whether patient requires suture removal from surgical wounds in groin area --[facility]. [facility] would benefit from participation in PHP [facility] outpatient cognitive behavior therapy after discharge if he is willing to participate.  Social [facility] so patient can continue to receive clozapine and get weekly CBCs ordered.  Social work is investigating ACT team options for patient.  [facility]    MartArthor Captain 02/02/2021, 6:36 PM

## 2021-02-02 NOTE — B[facility] Counselor (Signed)
CSW spoke with Sean Shepherd who states that she is prepared for Sean to come home.  She states that his sister and brother-in-law will be staying in his house for a couple months to help with watching his behaviors and Sean Shepherd states that the will be staying with her for at least 2 weeks.  Sean Shepherd states that she is looking to filing disability for him and CSW explained all the information that she provided in a packet to [place] today about various types of insurance including disability, medicaid, and the [employer].  CSW also included the schedules for the [employer] groups and information about guardianship and other options that might be helpful.  Sean Shepherd states that she will be putting away all of the sharp knifes and will be taking them to her bedroom to be locked up at night.  She states that there are no firearms in the home.  Sean Shepherd states that she will be taking Sean Shepherd to [facility] on Monday and will make sure that she has all of his doctors appointments on the calender.  Sean Shepherd states that she is nervous but ready to see her son and have him home.

## 2021-02-02 NOTE — Progress Notes (Signed)
Recreation Therapy Notes  Date:  6.3.22 Time: 0930 Location: [address] Dayroom  Group Topic: Stress Management  Goal Area(s) Addresses:  Patient will identify positive stress management techniques. Patient will identify benefits of using stress management post d/c.  Intervention: Stress Management   Activity: Meditation.  LRT played a meditation that focused on looking at each day as a new opportunity to start your day on a good note.    Education:  Stress Management, Discharge Planning.   Education Outcome: Acknowledges Education  Clinical Observations/Feedback: Pt did not attend group session.    Caroll Rancher, LRT/CTRS         Caroll Rancher A 02/02/2021 12:23 PM

## 2021-02-02 NOTE — Progress Notes (Signed)
   02/01/21 2112  Psych Admission Type (Psych Patients Only)  Admission Status Involuntary  Psychosocial Assessment  Patient Complaints Anxiety;Depression  Eye Contact Fair  Facial Expression Anxious;Flat;Sad  Affect Anxious;Appropriate to circumstance;Depressed  Speech Logical/coherent;Soft  Interaction Assertive  Motor Activity Slow  Appearance/Hygiene Unremarkable  Behavior Characteristics Cooperative;Appropriate to situation;Anxious  Mood Depressed;Anxious;Sad;Pleasant  Thought Process  Coherency WDL  Content WDL  Delusions None reported or observed  Perception Hallucinations  Hallucination None reported or observed ("sometimes, not as much")  Judgment Poor  Confusion None  Danger to Self  Current suicidal ideation? Denies  Danger to Others  Danger to Others None reported or observed  Danger to Others Abnormal  Harmful Behavior to others No threats or harm toward other people  Destructive Behavior No threats or harm toward property

## 2021-02-03 NOTE — Progress Notes (Signed)
   02/03/21 0618  Vital Signs  Temp 98.1 F (36.7 C)  Temp Source Oral  Pulse Rate (!) 102  BP 93/69  BP Location Left Arm  BP Method Automatic  Patient Position (if appropriate) Standing  Oxygen Therapy  SpO2 96 %   D: Patient  Denies SI/HI/ AVH. Patient rated anxiety 9/10 and depression 7/10, but did not want any PRN medicine. Patient did go outside for group and went to the cafeteria for dinner.  A:  Patient took scheduled medicine.  Support and encouragement provided Routine safety checks conducted every 15 minutes. Patient  Informed to notify staff with any concerns.   R:  Safety maintained.

## 2021-02-03 NOTE — Progress Notes (Signed)
   02/03/21 2000  Psych Admission Type (Psych Patients Only)  Admission Status Involuntary  Psychosocial Assessment  Patient Complaints Anxiety;Depression  Eye Contact Brief  Facial Expression Anxious  Affect Anxious;Appropriate to circumstance;Depressed  Speech Logical/coherent;Soft  Interaction Assertive;Minimal  Motor Activity Slow  Appearance/Hygiene Unremarkable  Behavior Characteristics Cooperative;Anxious  Mood Depressed;Anxious  Thought Process  Coherency WDL  Content WDL  Delusions None reported or observed  Perception Hallucinations  Hallucination Auditory (chatter)  Judgment Poor  Confusion None  Danger to Self  Current suicidal ideation? Denies  Danger to Others  Danger to Others None reported or observed   Pt seen in bed but easy to arouse. Pt denies SI, HI, VH and pain. Pt endorses AH as "chatter." Pt rates anxiety 9/10 and depression 6-7/10 r/t leaving tomorrow. Pt still ruminating on the future and staying with his parents. Still social anxiety and needs encouragement to take in more fluids and to engage with others on the unit. Takes meds as prescribed.

## 2021-02-03 NOTE — Progress Notes (Signed)
[facility]  02/03/2021 2:55 PM Harley HallmarkChanning Bromwell  MRN:  [id number]   Reason for admission: Sean RadChanning Reiseris a 45 year old male with prior diagnosesof schizoaffectivedisorder versus schizophrenia, depression and past marijuana use who [facility] after prolonged hospitalization for recovery from multiple significant self-inflicted injuries in response to command auditory hallucinations in a suicide attempt.  Objective:  Nursing report obtained, chart reviewed and care plan reviewed with members of our interdisciplinary team.  Patient was evaluated this afternoon and he reported feeling anxious and his stressor is the fact that he is being discharged tomorrow.  He is afraid of what the future holds for him.  He stated that he has been sleeping very well until the last two nights he could not sleep due to feeling anxious about his discharge tomorrow.  Patient [facility].  He reported fairly ok appetite.  He is mostly in his room unless out for his medications and meals.  Provider reassured patient that we will offer him anxiolytic medication-Hydroxyzine to helP with his anxiety.  He was also to think about going home as positive and improvement.  Writer also reminded him that he will be staying with his parents.  He denied SI/HI/AVH and no mention of paranoia.  Clozapine lab results with other labs reviewed. Principal Problem: Schizoaffective disorder, bipolar type (HCC) Diagnosis: Principal Problem:   Schizoaffective disorder, bipolar type (HCC) Active Problems:   Insomnia   Anxiety disorder, unspecified   Total Time spent with patient: 20 minutes  Past Psychiatric History: See admission H&P  Past Medical History:  Past Medical History:  Diagnosis Date  . Anxiety disorder, unspecified 01/10/2021  . Bipolar disorder (HCC)   . Depression   . Schizoaffective disorder [facility](HCC)     Past  Surgical History:  Procedure Laterality Date  . LAPAROTOMY N/A 12/25/2020   Procedure: EXPLORATORY LAPAROTOMY; REPAIR OF GROIN LACERATIONS X2, RIGHT THIGH LACERATION X 2; REPAIR RIGHT NECK AND LEFT NECK LACERATION AND REPAIR OF LEFT WRIST LACERATION;  Surgeon: Violeta Gelinashompson, Burke, MD;  [facility];  Service: General;  Laterality: N/A;  . NO PAST SURGERIES     Family History: History reviewed. No pertinent family history. Family Psychiatric  History: See admission H&P Social History:  Social History   Substance and Sexual Activity  Alcohol Use Not Currently     Social History   Substance and Sexual Activity  Drug Use Yes  . Types: Marijuana    Social History   Socioeconomic History  . Marital status: Single    Spouse name: Not on file  . Number of children: 0  . Years of education: Not on file  . Highest education level: Not on file  Occupational History  . Not on file  Tobacco Use  . Smoking status: Former Smoker    Quit date: 07/31/2014    Years since quitting: 6.5  . Smokeless tobacco: Never Used  Vaping Use  . Vaping Use: Every day  . Substances: THC, CBD  Substance and Sexual Activity  . Alcohol use: Not Currently  . Drug use: Yes    Types: Marijuana  . Sexual activity: Never  Other Topics Concern  . Not on file  Social History Narrative  . Not on file   Social Determinants of Health   Financial Resource Strain: Not on file  Food Insecurity: Not on file  Transportation Needs: Not on file  Physical Activity: Not on file  Stress: Not on file  Social Connections: Not on file   Additional Social History:                         Sleep: Good  Appetite:  Fair  Current Medications: Current Facility-Administered Medications  Medication Dose Route Frequency Provider Last Rate Last Admin  . acetaminophen (TYLENOL) tablet 650 mg  650 mg Oral Q6H PRN Lauro Franklin, MD      . alum & mag hydroxide-simeth (MAALOX/MYLANTA) 200-200-20 MG/5ML  suspension 30 mL  30 mL Oral Q4H PRN Lauro Franklin, MD      . atropine 1 % ophthalmic solution 1 drop  1 drop Sublingual Q8H PRN Mason Jim, Amy E, MD      . buPROPion [facility] SR) 12 hr tablet 100 mg  100 mg Oral Daily Claudie Revering, MD   100 mg at 02/03/21 0841  . cloZAPine (CLOZARIL) tablet 200 mg  200 mg Oral QHS Antonieta Pert, MD   200 mg at 02/02/21 2034  . docusate sodium (COLACE) capsule 100 mg  100 mg Oral BID Claudie Revering, MD   100 mg at 02/03/21 3557  . feeding supplement (ENSURE ENLIVE / ENSURE PLUS) liquid 237 mL  237 mL Oral TID BM Lauro Franklin, MD   237 mL at 02/03/21 1244  . hydrOXYzine (ATARAX/VISTARIL) tablet 25 mg  25 mg Oral Q8H PRN Claudie Revering, MD   25 mg at 02/02/21 1437  . OLANZapine zydis (ZYPREXA) disintegrating tablet 5 mg  5 mg Oral Q8H PRN Comer Locket, MD   5 mg at 01/21/21 1728   And  . LORazepam (ATIVAN) tablet 1 mg  1 mg Oral PRN Mason Jim, Amy E, MD       And  . ziprasidone (GEODON) injection 20 mg  20 mg Intramuscular PRN Mason Jim, Amy E, MD      . magnesium hydroxide (MILK OF MAGNESIA) suspension 30 mL  30 mL Oral Daily PRN Lauro Franklin, MD      . melatonin tablet 10 mg  10 mg Oral QHS Claudie Revering, MD   10 mg at 02/02/21 2033  . multivitamin with minerals tablet 1 tablet  1 tablet Oral Daily Lauro Franklin, MD   1 tablet at 02/03/21 [phone]  . mupirocin cream (BACTROBAN) 2 %   Topical TID Antonieta Pert, MD   1 application at 02/02/21 [phone]  . neomycin-bacitracin-polymyxin (NEOSPORIN) ointment   Topical BID Comer Locket, MD   1 application at 02/02/21 1720  . polyethylene glycol (MIRALAX / GLYCOLAX) packet 17 g  17 g Oral Daily Lauro Franklin, MD   17 g at 02/02/21 7062    Lab Results:  No results found for this or any previous visit (from the past 48 hour(s)).  Blood Alcohol level:  Lab Results  Component Value Date   ETH <10 12/25/2020   ETH <10 11/18/2020    Metabolic Disorder  Labs: Lab Results  Component Value Date   HGBA1C 5.2 01/09/2021   MPG 102.54 01/09/2021   MPG 117 06/28/2020   No results found for: PROLACTIN Lab Results  Component Value Date   CHOL 188 11/18/2020   TRIG 124 11/18/2020   HDL 69 11/18/2020   CHOLHDL 2.7 11/18/2020   VLDL 25 11/18/2020   LDLCALC 94 11/18/2020   LDLCALC 128 (H) 06/28/2020    Physical Findings: AIMS: Facial and Oral Movements Muscles of Facial Expression:  None, normal Lips and Perioral Area: None, normal Jaw: None, normal Tongue: None, normal,Extremity Movements Upper (arms, wrists, hands, fingers): None, normal Lower (legs, knees, ankles, toes): None, normal, Trunk Movements Neck, shoulders, hips: None, normal, Overall Severity Severity of abnormal movements (highest score from questions above): None, normal Incapacitation due to abnormal movements: None, normal Patient's awareness of abnormal movements (rate only patient's report): No Awareness, Dental Status Current problems with teeth and/or dentures?: No Does patient usually wear dentures?: No  CIWA:    COWS:     Musculoskeletal: Strength & Muscle Tone: within normal limits Gait & Station: normal Patient leans: N/A  Psychiatric Specialty Exam:  Presentation  General Appearance: Appropriate for Environment; Fairly Groomed  Eye Contact:Good  Speech:Clear and Coherent; Normal Rate  Speech Volume:Normal  Handedness:Right   Mood and Affect  Mood:Anxious  Affect:Congruent   Thought Process  Thought Processes:Coherent; Goal Directed; Linear  Descriptions of Associations:Intact  Orientation:Full (Time, Place and Person)  Thought Content:Rumination  History of Schizophrenia/Schizoaffective disorder:Yes  Duration of Psychotic Symptoms:Greater than six months  Hallucinations:Hallucinations: None  Ideas of Reference:None  Suicidal Thoughts:Suicidal Thoughts: Yes, Passive (Reports these are less frequent) SI Passive Intent and/or  Plan: Without Intent; Without Plan  Homicidal Thoughts:Homicidal Thoughts: No   Sensorium  Memory:Immediate Good; Recent Good  Judgment:Fair  Insight:Fair   Executive Functions  Concentration:Good  Attention Span:Good  Recall:Good  Fund of Knowledge:Good  Language:Good   Psychomotor Activity  Psychomotor Activity:Psychomotor Activity: Normal   Assets  Assets:Communication Skills; Desire for Improvement; Resilience; Housing; Social Support   Sleep  Sleep:Sleep: Good Number of Hours of Sleep: 5.25    Physical Exam: Physical Exam Vitals and nursing note reviewed.  Constitutional:      General: He is not in acute distress. HENT:     Head: Normocephalic.  Pulmonary:     Effort: Pulmonary effort is normal.  Neurological:     General: No focal deficit present.     Mental Status: He is alert and oriented to person, place, and time.    Review of Systems  Constitutional: Negative for diaphoresis and fever.  Respiratory: Negative.   Cardiovascular: Negative.   Gastrointestinal: Negative.  Negative for constipation.  Neurological: Negative for tremors and seizures.       Positive for lightheadedness upon standing in the morning  Psychiatric/Behavioral: Positive for depression. Negative for hallucinations and suicidal ideas. The patient is nervous/anxious. The patient does not have insomnia.    Blood pressure 93/69, pulse (!) 102, temperature 98.1 F (36.7 C), temperature source Oral, resp. rate 18, height 6' (1.829 m), weight 67.4 kg, SpO2 96 %. Body mass index is 20.14 kg/m.   Treatment Plan Summary: Daily contact with patient to assess and evaluate symptoms and progress in treatment and Medication management   Continue IVC status  Continue every 15-minute observation status.  Encouragedparticipation in group therapy and therapeutic milieu  Encouragedmovement around unit, getting out of bed,going to cafeteria for meals,attempting spending time  in day room.   Psychosis -Continue clozapine 200mg  Q PM. CBC,ANCwere WNL on 02/01/21. -Weekly CBC with WBC and ANC.  Ordered CBC with differential to be drawn on the morning of 02/04/2021 in preparation for discharge so patient may be dispensed maximum clozapine supply. -Clozapine level was drawn on 02/01/2021 and results are Clozapine level-235, Nor Clozapine 91 and Total Clozapine +Norcloz 326   Depression/anxiety -Continue bupropion SR 100mg  daily -Continue Vistaril 25 mg every 8 hours as needed anxiety paren continue monitoring for possible excess anticholinergic side effects  while on Clozaril with PRN Vistaril) -Patient would benefit from participation in cognitive behavior therapy and PHP after discharge if he is willing to participate.  His social anxiety and avoidance of social situations has been lifelong.  Bowel regimen -Continuedocusate 100mg  once daily.   -Continue MOM as needed -Continue MiraLAX daily -Push p.o. fluids -Ambulation and out of bed encouraged  Insomnia -Continuemelatonin 10mg  poQHSstanding dose   Wound Care -- dry dressing daily  -- patient encouraged to attend to ADLs and shower  --Need to clarify with urology whether patient requires suture removal from surgical wounds in groin area --Social work is attempting to schedule [facility]  Disposition planning in progress. Patient would benefit from participation in [facility] and outpatient cognitive behavior therapy after discharge if he is willing to participate.  Social work is attempting to arrange [facility] for within a week after discharge so patient can continue to receive clozapine and get weekly CBCs ordered.  Social work is investigating ACT team options for patient.  Patient remains on the [facility] transfer waitlist    [facility], NP-PMHNP-BC 02/03/2021, 2:55 PM

## 2021-02-03 NOTE — BHH Group Notes (Signed)
LCSW Group Therapy Note  02/03/2021    10:00-11:00am   Type of Therapy and Topic:  Group Therapy: Early Messages Received About Anger  Participation Level:  Did Not Attend   Description of Group:   In this group, patients shared and discussed the early messages received in their lives about anger through parental or other adult modeling, teaching, repression, punishment, violence, and more.  Participants identified how those childhood lessons influence even now how they usually or often react when angered.  The group discussed that anger is a secondary emotion and what may be the underlying emotional themes that come out through anger outbursts or that are ignored through anger suppression.    Therapeutic Goals: Patients will identify one or more childhood message about anger that they received and how it was taught to them. Patients will discuss how these childhood experiences have influenced and continue to influence their own expression or repression of anger even today. Patients will explore possible primary emotions that tend to fuel their secondary emotion of anger. Patients will learn that anger itself is normal and cannot be eliminated, and that healthier coping skills can assist with resolving conflict rather than worsening situations.  Summary of Patient Progress:  Did not attend  Therapeutic Modalities:   Cognitive Behavioral Therapy Motivation Interviewing  Brandis Wixted J Grossman-Orr  .  

## 2021-02-04 MED ORDER — POLYETHYLENE GLYCOL 3350 17 G PO PACK
17.0000 g | PACK | Freq: Every day | ORAL | 0 refills | Status: AC | PRN
  Filled 2021-02-04: qty 14, 14d supply, fill #0

## 2021-02-04 MED ORDER — MELATONIN 10 MG PO TABS
10.0000 mg | ORAL_TABLET | Freq: Every day | ORAL | 0 refills | Status: DC
  Filled 2021-02-04: qty 60, 30d supply, fill #0

## 2021-02-04 MED ORDER — HYDROXYZINE HCL 25 MG PO TABS
25.0000 mg | ORAL_TABLET | Freq: Three times a day (TID) | ORAL | 0 refills | Status: AC | PRN
  Filled 2021-02-04: qty 30, 10d supply, fill #0

## 2021-02-04 MED ORDER — BACITRACIN-NEOMYCIN-POLYMYXIN OINTMENT TUBE
1.0000 "application " | TOPICAL_OINTMENT | Freq: Two times a day (BID) | CUTANEOUS | 0 refills | Status: AC
  Filled 2021-02-04: qty 14, 7d supply, fill #0

## 2021-02-04 MED ORDER — ATROPINE SULFATE 1 % OP SOLN
1.0000 [drp] | Freq: Three times a day (TID) | OPHTHALMIC | 0 refills | Status: AC | PRN
  Filled 2021-02-04: qty 4.5, 30d supply, fill #0

## 2021-02-04 MED ORDER — CLOZAPINE 200 MG PO TABS
200.0000 mg | ORAL_TABLET | Freq: Every day | ORAL | 0 refills | Status: DC
  Filled 2021-02-04: qty 30, 30d supply, fill #0

## 2021-02-04 MED ORDER — BUPROPION HCL ER (SR) 100 MG PO TB12
100.0000 mg | ORAL_TABLET | Freq: Every day | ORAL | 0 refills | Status: DC
  Filled 2021-02-04: qty 30, 30d supply, fill #0

## 2021-02-04 NOTE — Progress Notes (Signed)
D:  Patient's self inventory sheet, patient has fair sleep, no sleep medication given.  Fair appetite, low energy level, poor concentration.  Denied SI.  Physical problems.  Rated depression 7, hopeless 8, anxiety 9.  Denied withdrawals.  Physical problems, lightheaded, dizzy.  Physical pain, stomach, #2.  Goal discharge.  Plans to shower, gather belongings.  Does have discharge plans. A:  Medications administered per MD orders.  Emotional support and encouragement given patient. R:  Denied SI and HI, contracts  for safety.  Denied A/V hallucinations.  Safety maintained with 15 minute checks.

## 2021-02-04 NOTE — Progress Notes (Signed)
  [facility] Adult Case Management Discharge Plan :  Will you be returning to the same living situation after discharge:  Yes,  home with mother staying with him At discharge, do you have transportation home?: Yes,  mother Do you have the ability to pay for your medications: No.  Release of information consent forms completed and in the chart;  Patient's signature needed at discharge.  Patient to Follow up at:  Follow-up Information    [facility]. Call.   Why: Please call to do a screening to establish Peer Support Services. Also continue to attend the [facility] Group at 5:00pm wach Wednesday evening.  Contact information: [address] [place], [place] [address]  Phone: [phone]       [facility] Follow up on 02/06/2021.   Why: A referral has been placed for you to attend virtual groups.  Please check your email for the link and a paper version of dates and times has been provided to you at discharge.  Contact information: Address: [facility], [place], [place] [address]   Phone: [phone] Email Address: [email or web address] .Marnette Burgess [facility] Follow up on 02/09/2021.   Specialty: Behavioral Health Why: You have an appointment for medication management on 02/09/21 at 9:30am.  It is important that you keep this appointment to maintain your current medications. This appointment will be held in person.  Contact information: [address] [place] [address] [phone]       [facility] Follow up on 02/08/2021.   Specialty: Professional Counselor Why: Please go to this agency on 02/08/2021 at 8:30am to begin intake for therapy services.  These are walk-in appointments and are first-come and first-served. Contact information: [facility] [place] [address] [place] [address] [phone]        Turner Daniels, MD. Call.   Specialty: Urology Why: A  referrral has on your behalf for reconstructive services.  Please call this provider to schedule an appointment.  Contact information: [address] [place] [address] [phone]        [facility] Follow up on 02/05/2021.   Why: You are scheduled to start the PSR Day Program on 02/05/21 at 8:00am.  This program is from 8:00am to 3:00pm everyday and is in person.  Contact information: Address: [address] [place], [place], [place] [address]  Phone: [phone] Fax: [phone]        [facility] Call.   Why: A referral for ACTT Services has been on your behalf.  Please contact this agency to schedule an intake for [employer].  Contact information: [address] [place] [address] [phone]        [facility] Follow up on 03/15/2021.   Why: You have a primary care appointment on 03/15/21 at 2:30pm.  This appointment is in person. Contact information: [address] [place] [address] [phone]              Next level of care provider has access to [facility] Link:no  Safety Planning and Suicide Prevention discussed: Yes,  mother     Has patient been referred to the Quitline?: N/A patient is not a smoker  Patient has been referred for addiction treatment: N/A  Lynnell Chad, LCSW 02/04/2021, 12:43 PM

## 2021-02-04 NOTE — BHH Group Notes (Signed)
Patient did not attend the goals group. 

## 2021-02-04 NOTE — Plan of Care (Signed)
Nurse discussed anxiety, depression and coping skills with patient.  

## 2021-02-04 NOTE — BHH Suicide Risk Assessment (Signed)
[facility] Discharge Suicide Risk Assessment   Principal Problem: Schizoaffective disorder, bipolar type [facility]) Discharge Diagnoses: Principal Problem:   Schizoaffective disorder, bipolar type (HCC) Active Problems:   Insomnia   Anxiety disorder, unspecified   Total Time spent with patient: 20 minutes  Musculoskeletal: Strength & Muscle Tone: within normal limits Gait & Station: normal Patient leans: N/A  Psychiatric Specialty Exam  Presentation  General Appearance: Appropriate for Environment  Eye Contact:Good  Speech:Normal Rate  Speech Volume:Decreased  Handedness:Right   Mood and Affect  Mood:Anxious  Duration of Depression Symptoms: Greater than two weeks  Affect:Appropriate   Thought Process  Thought Processes:Goal Directed  Descriptions of Associations:Intact  Orientation:Full (Time, Place and Person)  Thought Content:Logical  History of Schizophrenia/Schizoaffective disorder:Yes  Duration of Psychotic Symptoms:Greater than six months  Hallucinations:Hallucinations: Auditory  Ideas of Reference:None  Suicidal Thoughts:Suicidal Thoughts: No  Homicidal Thoughts:Homicidal Thoughts: No   Sensorium  Memory:Immediate Good; Recent Good; Remote Good  Judgment:Fair  Insight:Fair   Executive Functions  Concentration:Good  Attention Span:Good  Recall:Good  Fund of Knowledge:Good  Language:Good   Psychomotor Activity  Psychomotor Activity:Psychomotor Activity: Decreased   Assets  Assets:Desire for Improvement; Housing; Resilience; Social Support   Sleep  Sleep:Sleep: Good Number of Hours of Sleep: 6.75   Physical Exam: Physical Exam Vitals and nursing note reviewed.  Constitutional:      Appearance: Normal appearance.  HENT:     Head: Normocephalic and atraumatic.  Pulmonary:     Effort: Pulmonary effort is normal.  Neurological:     General: No focal deficit present.     Mental Status: He is alert and oriented to person,  place, and time.    Review of Systems  All other systems reviewed and are negative.  Blood pressure 95/68, pulse (!) 108, temperature (!) 97.4 F (36.3 C), temperature source Oral, resp. rate 18, height 6' (1.829 m), weight 67.6 kg, SpO2 96 %. Body mass index is 20.21 kg/m.  Mental Status Per Nursing Assessment::   On Admission:  Self-harm thoughts  Demographic Factors:  Male, Caucasian, Low socioeconomic status and Unemployed  Loss Factors: Decrease in vocational status and Financial problems/change in socioeconomic status  Historical Factors: Impulsivity  Risk Reduction Factors:   Living with another person, especially a relative and Positive social support  Continued Clinical Symptoms:  Depression:   Impulsivity Schizophrenia:   Paranoid or undifferentiated type  Cognitive Features That Contribute To Risk:  Thought constriction (tunnel vision)    Suicide Risk:  Minimal: No identifiable suicidal ideation.  Patients presenting with no risk factors but with morbid ruminations; may be classified as minimal risk based on the severity of the depressive symptoms   Follow-up Information    [facility]. Call.   Why: Please call to do a screening to establish Peer Support Services. Also continue to attend the [facility] Group at 5:00pm wach Wednesday evening.  Contact information: [address] [place], [place] [address]  Phone: [phone]       [facility] Follow up on 02/06/2021.   Why: A referral has been placed for you to attend virtual groups.  Please check your email for the link and a paper version of dates and times has been provided to you at discharge.  Contact information: Address: [facility], [place], [place] [address]   Phone: [phone] Email Address: [email or web address] .Marnette Burgess [facility] Follow up on 02/09/2021.   Specialty: Behavioral Health Why: You have an  appointment for medication  management on 02/09/21 at 9:30am.  It is important that you keep this appointment to maintain your current medications. This appointment will be held in person.  Contact information: [address] [place] [address] [phone]       [facility] Follow up on 02/08/2021.   Specialty: Professional Counselor Why: Please go to this agency on 02/08/2021 at 8:30am to begin intake for therapy services.  These are walk-in appointments and are first-come and first-served. Contact information: [facility] [place] [address] [place] [address] [phone]        Turner Daniels, MD. Call.   Specialty: Urology Why: A referrral has on your behalf for reconstructive services.  Please call this provider to schedule an appointment.  Contact information: [address] [place] [address] [phone]        [facility] Follow up on 02/05/2021.   Why: You are scheduled to start the PSR Day Program on 02/05/21 at 8:00am.  This program is from 8:00am to 3:00pm everyday and is in person.  Contact information: Address: [address] [place], [place], [place] [address]  Phone: [phone] Fax: [phone]        [facility] Call.   Why: A referral for ACTT Services has been on your behalf.  Please contact this agency to schedule an intake for [employer].  Contact information: [address] [place] [address] [phone]        [facility] Follow up on 03/15/2021.   Why: You have a primary care appointment on 03/15/21 at 2:30pm.  This appointment is in person. Contact information: [address] [place] [address] [phone]              Plan Of Care/Follow-up recommendations:  Activity:  ad lib  Antonieta Pert, MD 02/04/2021, 9:16 AM
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
## 2021-02-04 NOTE — Progress Notes (Signed)
Discharge Note:  Patient discharged home with wife.  Suicide prevention information given and discussed with patient who stated he understood and had no questions.  Patient denied SI and HI.  Denied A/V hallucinations.  Patient stated he received all his belongings, clothing, toiletries, misc items, etc.  Patient stated he appreciated all assistance received from [facility] staff.  All required discharge information given.

## 2021-02-04 NOTE — [facility] Group Notes (Signed)
[facility] LCSW Group Therapy Note  02/04/2021    Type of Therapy and Topic:  Group Therapy:  A Hero Worthy of Support  Participation Level:  Did Not Attend   Description of Group:  Patients in this group were introduced to the concept that additional supports including self-support are an essential part of recovery.  Matching needs with supports to help fulfill those needs was explained.  Establishing boundaries that can gradually be increased or decreased was described, with patients giving their own examples of establishing appropriate boundaries in their lives.  A song entitled "My Own Hero" was played and a group discussion ensued in which patients stated it inspired them to help themselves in order to succeed, because other people cannot achieve their goals such as sobriety or stability for them.  A song was played called "I Am Enough" which led to a discussion about being willing to believe we are worth the effort of being a self-support.   Therapeutic Goals: 1)  demonstrate the importance of being a key part of one's own support system 2)  discuss various available supports 3)  encourage patient to use music as part of their self-support and focus on goals 4)  elicit ideas from patients about supports that need to be added   Summary of Patient Progress:  The patient was invited to group by MHT and overhead announcement, did not attend.  Therapeutic Modalities:   Motivational Interviewing Activity  Tyeshia Cornforth J Grossman-Orr       

## 2021-02-05 ENCOUNTER — Other Ambulatory Visit: Payer: Self-pay

## 2021-02-06 ENCOUNTER — Other Ambulatory Visit: Payer: Self-pay

## 2021-02-09 ENCOUNTER — Encounter (HOSPITAL_COMMUNITY): Payer: Self-pay | Admitting: Physician Assistant

## 2021-02-09 ENCOUNTER — Other Ambulatory Visit: Payer: Self-pay

## 2021-02-09 ENCOUNTER — Telehealth (HOSPITAL_COMMUNITY): Payer: Self-pay | Admitting: *Deleted

## 2021-02-09 ENCOUNTER — Ambulatory Visit (INDEPENDENT_AMBULATORY_CARE_PROVIDER_SITE_OTHER): Payer: No Payment, Other | Admitting: Physician Assistant

## 2021-02-09 VITALS — BP 130/87 | HR 108 | Ht 72.0 in | Wt 152.4 lb

## 2021-02-09 DIAGNOSIS — G4700 Insomnia, unspecified: Secondary | ICD-10-CM

## 2021-02-09 DIAGNOSIS — F250 Schizoaffective disorder, bipolar type: Secondary | ICD-10-CM | POA: Diagnosis not present

## 2021-02-09 DIAGNOSIS — F329 Major depressive disorder, single episode, unspecified: Secondary | ICD-10-CM | POA: Diagnosis not present

## 2021-02-09 DIAGNOSIS — F411 Generalized anxiety disorder: Secondary | ICD-10-CM | POA: Diagnosis not present

## 2021-02-09 MED ORDER — BUPROPION HCL ER (SR) 100 MG PO TB12: 100.0000 mg | ORAL_TABLET | Freq: Every day | ORAL | 1 refills | Status: DC

## 2021-02-09 MED ORDER — MELATONIN 10 MG PO TABS: 10.0000 mg | ORAL_TABLET | Freq: Every day | ORAL | 1 refills | Status: AC

## 2021-02-09 NOTE — Telephone Encounter (Signed)
Patient discharged from [facility] on Sunday and is now on Clozaril. He is requiring weekly lab draws for a CBC. Lab drawn without issue in patients right arm and sent to [facility] for processing. He is to return weekly for lab for now.
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
## 2021-02-09 NOTE — Telephone Encounter (Signed)
Completed patient assistance form for him to get clozaril thru the pharmaceutical company as he is unisured. He is to pick up his meds Monday the 13 th from [place], he is on weekly blood draws for now as he is a new clozaril start. His medicine weekly at [place] is 12.45 and his mom is able to pay that for now until can get him on PAP.

## 2021-02-10 LAB — CBC WITH DIFFERENTIAL/PLATELET
Basophils Absolute: 0 10*3/uL (ref 0.0–0.2)
Basos: 1 %
EOS (ABSOLUTE): 0.2 10*3/uL (ref 0.0–0.4)
Eos: 3 %
Hematocrit: 37.7 % (ref 37.5–51.0)
Hemoglobin: 11.8 g/dL — ABNORMAL LOW (ref 13.0–17.7)
Immature Grans (Abs): 0 10*3/uL (ref 0.0–0.1)
Immature Granulocytes: 0 %
Lymphocytes Absolute: 0.9 10*3/uL (ref 0.7–3.1)
Lymphs: 15 %
MCH: 28.6 pg (ref 26.6–33.0)
MCHC: 31.3 g/dL — ABNORMAL LOW (ref 31.5–35.7)
MCV: 92 fL (ref 79–97)
Monocytes Absolute: 0.3 10*3/uL (ref 0.1–0.9)
Monocytes: 5 %
Neutrophils Absolute: 4.4 10*3/uL (ref 1.4–7.0)
Neutrophils: 76 %
Platelets: 287 10*3/uL (ref 150–450)
RBC: 4.12 x10E6/uL — ABNORMAL LOW (ref 4.14–5.80)
RDW: 12.9 % (ref 11.6–15.4)
WBC: 5.8 10*3/uL (ref 3.4–10.8)

## 2021-02-12 ENCOUNTER — Other Ambulatory Visit: Payer: Self-pay

## 2021-02-13 ENCOUNTER — Encounter (HOSPITAL_COMMUNITY): Payer: Federal, State, Local not specified - Other | Admitting: Physician Assistant

## 2021-02-13 ENCOUNTER — Other Ambulatory Visit: Payer: Self-pay

## 2021-02-16 ENCOUNTER — Other Ambulatory Visit: Payer: Self-pay

## 2021-02-16 ENCOUNTER — Ambulatory Visit (HOSPITAL_COMMUNITY): Payer: No Payment, Other | Admitting: *Deleted

## 2021-02-16 DIAGNOSIS — F250 Schizoaffective disorder, bipolar type: Secondary | ICD-10-CM

## 2021-02-16 NOTE — Progress Notes (Signed)
CBC  drawn this am without difficulty in patients R arm. He is a weekly CBC for now. Mom reports she has seen improvement in his focus and concentration. His only complaint this am is he is tired all the time. He is sleeping better, nearly 12 hours a night but still feels tired on awakening. He attends [employer] daily till 3 p. Discussed with him and mom applying for disability, he was recently denied but someone at [facility] is helping him to reapply.

## 2021-02-19 ENCOUNTER — Other Ambulatory Visit (HOSPITAL_COMMUNITY): Payer: Self-pay | Admitting: *Deleted

## 2021-02-19 DIAGNOSIS — F250 Schizoaffective disorder, bipolar type: Secondary | ICD-10-CM

## 2021-02-22 ENCOUNTER — Telehealth (HOSPITAL_COMMUNITY): Payer: Self-pay | Admitting: Physician Assistant

## 2021-02-22 MED ORDER — CLOZAPINE 200 MG PO TABS: 200.0000 mg | ORAL_TABLET | Freq: Every day | ORAL | 0 refills | Status: DC

## 2021-02-22 NOTE — Telephone Encounter (Signed)
Printed off prescription for patient assistance program.

## 2021-02-23 ENCOUNTER — Other Ambulatory Visit: Payer: Self-pay

## 2021-02-23 ENCOUNTER — Ambulatory Visit (HOSPITAL_COMMUNITY): Payer: No Payment, Other

## 2021-02-23 ENCOUNTER — Telehealth (HOSPITAL_COMMUNITY): Payer: Self-pay | Admitting: *Deleted

## 2021-02-23 ENCOUNTER — Encounter (HOSPITAL_COMMUNITY): Payer: Federal, State, Local not specified - Other | Admitting: Physician Assistant

## 2021-02-23 DIAGNOSIS — F250 Schizoaffective disorder, bipolar type: Secondary | ICD-10-CM

## 2021-02-23 NOTE — Telephone Encounter (Signed)
Patient in as scheduled for his weekly blood draw for CBC related to his taking Clozaril. Drew one tube from R arm without incident. Confirmation number for this draw [phone].

## 2021-02-24 LAB — CBC WITH DIFFERENTIAL/PLATELET
Basophils Absolute: 0.1 10*3/uL (ref 0.0–0.2)
Basos: 1 %
EOS (ABSOLUTE): 0.2 10*3/uL (ref 0.0–0.4)
Eos: 3 %
Hematocrit: 37.8 % (ref 37.5–51.0)
Hemoglobin: 12 g/dL — ABNORMAL LOW (ref 13.0–17.7)
Immature Grans (Abs): 0 10*3/uL (ref 0.0–0.1)
Immature Granulocytes: 0 %
Lymphocytes Absolute: 0.9 10*3/uL (ref 0.7–3.1)
Lymphs: 12 %
MCH: 28.2 pg (ref 26.6–33.0)
MCHC: 31.7 g/dL (ref 31.5–35.7)
MCV: 89 fL (ref 79–97)
Monocytes Absolute: 0.4 10*3/uL (ref 0.1–0.9)
Monocytes: 5 %
Neutrophils Absolute: 5.9 10*3/uL (ref 1.4–7.0)
Neutrophils: 79 %
Platelets: 238 10*3/uL (ref 150–450)
RBC: 4.25 x10E6/uL (ref 4.14–5.80)
RDW: 12.4 % (ref 11.6–15.4)
WBC: 7.5 10*3/uL (ref 3.4–10.8)

## 2021-02-27 ENCOUNTER — Telehealth (HOSPITAL_COMMUNITY): Payer: Self-pay | Admitting: *Deleted

## 2021-02-27 NOTE — Telephone Encounter (Signed)
Call last week from [facility] who will be providing patients Clozaril to ask for a current CBC in order to plan for his shipment to be sent out.  Expecting his first delivery of Clozaril on the 29th of June

## 2021-03-02 ENCOUNTER — Other Ambulatory Visit: Payer: Self-pay

## 2021-03-02 ENCOUNTER — Ambulatory Visit (HOSPITAL_COMMUNITY): Payer: No Payment, Other | Admitting: *Deleted

## 2021-03-02 DIAGNOSIS — F250 Schizoaffective disorder, bipolar type: Secondary | ICD-10-CM

## 2021-03-02 NOTE — Progress Notes (Signed)
In as planned for his weekly blood draw for his CBC related to him taking Clozaril. BLood obtained without difficulty from his R arm and sent to [facility] for processing. Also, informed patient and his mom I have received his Clozaril from the drug company that [facility] it so going forward he will come here on mondays, this week tues due to the holiday and pick his medicine up here.

## 2021-03-03 LAB — CBC WITH DIFFERENTIAL/PLATELET
Basophils Absolute: 0 10*3/uL (ref 0.0–0.2)
Basos: 1 %
EOS (ABSOLUTE): 0.3 10*3/uL (ref 0.0–0.4)
Eos: 6 %
Hematocrit: 37.4 % — ABNORMAL LOW (ref 37.5–51.0)
Hemoglobin: 12.2 g/dL — ABNORMAL LOW (ref 13.0–17.7)
Immature Grans (Abs): 0 10*3/uL (ref 0.0–0.1)
Immature Granulocytes: 0 %
Lymphocytes Absolute: 1.1 10*3/uL (ref 0.7–3.1)
Lymphs: 22 %
MCH: 28.4 pg (ref 26.6–33.0)
MCHC: 32.6 g/dL (ref 31.5–35.7)
MCV: 87 fL (ref 79–97)
Monocytes Absolute: 0.4 10*3/uL (ref 0.1–0.9)
Monocytes: 7 %
Neutrophils Absolute: 3.2 10*3/uL (ref 1.4–7.0)
Neutrophils: 64 %
Platelets: 245 10*3/uL (ref 150–450)
RBC: 4.29 x10E6/uL (ref 4.14–5.80)
RDW: 12.9 % (ref 11.6–15.4)
WBC: 4.9 10*3/uL (ref 3.4–10.8)

## 2021-03-07 ENCOUNTER — Encounter: Payer: Self-pay | Admitting: Endocrinology

## 2021-03-09 ENCOUNTER — Ambulatory Visit (HOSPITAL_COMMUNITY): Payer: No Payment, Other | Admitting: *Deleted

## 2021-03-09 ENCOUNTER — Other Ambulatory Visit: Payer: Self-pay

## 2021-03-09 ENCOUNTER — Telehealth (HOSPITAL_COMMUNITY): Payer: Self-pay | Admitting: *Deleted

## 2021-03-09 ENCOUNTER — Other Ambulatory Visit (HOSPITAL_COMMUNITY): Payer: Self-pay | Admitting: Physician Assistant

## 2021-03-09 DIAGNOSIS — F250 Schizoaffective disorder, bipolar type: Secondary | ICD-10-CM

## 2021-03-09 NOTE — Telephone Encounter (Signed)
Called patients mom this am to change time for lab draw today due to a conflict with writers schedule. To come today at 930

## 2021-03-09 NOTE — Progress Notes (Signed)
In as planned for his weekly lab draw for a CBC related to him taking Clozaril. Specimen obtained from his R arm without difficulty. He is to return on Monday to pick up his Clozaril pills which are provided by the manufacturer.

## 2021-03-12 LAB — CBC WITH DIFFERENTIAL/PLATELET
Basophils Absolute: 0 10*3/uL (ref 0.0–0.2)
Basos: 1 %
EOS (ABSOLUTE): 0.2 10*3/uL (ref 0.0–0.4)
Eos: 4 %
Hematocrit: 36.2 % — ABNORMAL LOW (ref 37.5–51.0)
Hemoglobin: 11.7 g/dL — ABNORMAL LOW (ref 13.0–17.7)
Immature Grans (Abs): 0 10*3/uL (ref 0.0–0.1)
Immature Granulocytes: 0 %
Lymphocytes Absolute: 0.8 10*3/uL (ref 0.7–3.1)
Lymphs: 13 %
MCH: 28.1 pg (ref 26.6–33.0)
MCHC: 32.3 g/dL (ref 31.5–35.7)
MCV: 87 fL (ref 79–97)
Monocytes Absolute: 0.4 10*3/uL (ref 0.1–0.9)
Monocytes: 6 %
Neutrophils Absolute: 4.9 10*3/uL (ref 1.4–7.0)
Neutrophils: 76 %
Platelets: 282 10*3/uL (ref 150–450)
RBC: 4.17 x10E6/uL (ref 4.14–5.80)
RDW: 12.9 % (ref 11.6–15.4)
WBC: 6.4 10*3/uL (ref 3.4–10.8)

## 2021-03-12 LAB — SPECIMEN STATUS REPORT

## 2021-03-14 DIAGNOSIS — S38221A Complete traumatic amputation of penis, initial encounter: Secondary | ICD-10-CM | POA: Insufficient documentation

## 2021-03-15 ENCOUNTER — Other Ambulatory Visit: Payer: Self-pay

## 2021-03-15 ENCOUNTER — Ambulatory Visit: Payer: Self-pay | Attending: Internal Medicine | Admitting: Internal Medicine

## 2021-03-15 ENCOUNTER — Telehealth (HOSPITAL_COMMUNITY): Payer: Self-pay | Admitting: *Deleted

## 2021-03-15 VITALS — BP 125/91 | HR 78 | Resp 16 | Ht 72.0 in | Wt 160.0 lb

## 2021-03-15 DIAGNOSIS — F322 Major depressive disorder, single episode, severe without psychotic features: Secondary | ICD-10-CM

## 2021-03-15 DIAGNOSIS — Z9079 Acquired absence of other genital organ(s): Secondary | ICD-10-CM

## 2021-03-15 DIAGNOSIS — R030 Elevated blood-pressure reading, without diagnosis of hypertension: Secondary | ICD-10-CM

## 2021-03-15 DIAGNOSIS — D649 Anemia, unspecified: Secondary | ICD-10-CM

## 2021-03-15 DIAGNOSIS — Z7689 Persons encountering health services in other specified circumstances: Secondary | ICD-10-CM

## 2021-03-15 DIAGNOSIS — R1312 Dysphagia, oropharyngeal phase: Secondary | ICD-10-CM

## 2021-03-15 DIAGNOSIS — F250 Schizoaffective disorder, bipolar type: Secondary | ICD-10-CM

## 2021-03-15 DIAGNOSIS — F411 Generalized anxiety disorder: Secondary | ICD-10-CM

## 2021-03-15 DIAGNOSIS — Z936 Other artificial openings of urinary tract status: Secondary | ICD-10-CM
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
## 2021-03-15 NOTE — Telephone Encounter (Signed)
Fax from [facility] requesting a new prescription, either electronically or call the program line and speak with a pharmacist at [phone] as patient is out of refills. I spoke with the golks at [facility] and its up to you how many refills you provide. Fax number is [phone]

## 2021-03-16 ENCOUNTER — Ambulatory Visit (HOSPITAL_COMMUNITY): Payer: No Payment, Other | Admitting: *Deleted

## 2021-03-16 ENCOUNTER — Other Ambulatory Visit (HOSPITAL_COMMUNITY): Payer: Self-pay | Admitting: Physician Assistant

## 2021-03-16 DIAGNOSIS — Z936 Other artificial openings of urinary tract status: Secondary | ICD-10-CM | POA: Insufficient documentation

## 2021-03-16 DIAGNOSIS — D649 Anemia, unspecified: Secondary | ICD-10-CM

## 2021-03-16 DIAGNOSIS — Z9079 Acquired absence of other genital organ(s): Secondary | ICD-10-CM | POA: Insufficient documentation

## 2021-03-16 DIAGNOSIS — F250 Schizoaffective disorder, bipolar type: Secondary | ICD-10-CM

## 2021-03-16 NOTE — Progress Notes (Signed)
PATIENT ARRIVED FOR LAB DRAW TODAY.LABS DRAWN OUT OF RIGHT-ARM WITHOUT DIFFICULTY. PATIENT PLEASANT & TALKATIVE SHRED HE WAS HEADING TO THE SANCTUARY HOUSE THIS AM. PATIENT WILL RETURN TO [facility] ON Tuesday DEPENDING ON LAB VALUES.

## 2021-03-17 LAB — CBC WITH DIFFERENTIAL/PLATELET
Basophils Absolute: 0 10*3/uL (ref 0.0–0.2)
Basos: 1 %
EOS (ABSOLUTE): 0.3 10*3/uL (ref 0.0–0.4)
Eos: 5 %
Hematocrit: 36.7 % — ABNORMAL LOW (ref 37.5–51.0)
Hemoglobin: 11.8 g/dL — ABNORMAL LOW (ref 13.0–17.7)
Immature Grans (Abs): 0 10*3/uL (ref 0.0–0.1)
Immature Granulocytes: 0 %
Lymphocytes Absolute: 0.9 10*3/uL (ref 0.7–3.1)
Lymphs: 17 %
MCH: 27.5 pg (ref 26.6–33.0)
MCHC: 32.2 g/dL (ref 31.5–35.7)
MCV: 86 fL (ref 79–97)
Monocytes Absolute: 0.3 10*3/uL (ref 0.1–0.9)
Monocytes: 6 %
Neutrophils Absolute: 3.5 10*3/uL (ref 1.4–7.0)
Neutrophils: 71 %
Platelets: 293 10*3/uL (ref 150–450)
RBC: 4.29 x10E6/uL (ref 4.14–5.80)
RDW: 12.6 % (ref 11.6–15.4)
WBC: 5 10*3/uL (ref 3.4–10.8)

## 2021-03-17 LAB — IRON,TIBC AND FERRITIN PANEL
Ferritin: 28 ng/mL — ABNORMAL LOW (ref 30–400)
Iron Saturation: 16 % (ref 15–55)
Iron: 45 ug/dL (ref 38–169)
Total Iron Binding Capacity: 289 ug/dL (ref 250–450)
UIBC: 244 ug/dL (ref 111–343)

## 2021-03-17 NOTE — Progress Notes (Signed)
Let patient know that his iron level and iron stores are low.  I recommend taking iron supplement daily for about a month.  This can be purchased over-the-counter as Ferrous Sulfate 325 mg.  He should take 1 tablet daily for one month.

## 2021-03-21 ENCOUNTER — Inpatient Hospital Stay (HOSPITAL_COMMUNITY): Admission: RE | Admit: 2021-03-21 | Payer: Self-pay | Source: Ambulatory Visit

## 2021-03-23 ENCOUNTER — Ambulatory Visit (HOSPITAL_COMMUNITY): Payer: No Payment, Other | Admitting: *Deleted

## 2021-03-23 ENCOUNTER — Telehealth: Payer: Self-pay

## 2021-03-23 ENCOUNTER — Other Ambulatory Visit: Payer: Self-pay

## 2021-03-23 ENCOUNTER — Other Ambulatory Visit (HOSPITAL_COMMUNITY): Payer: Self-pay | Admitting: Physician Assistant

## 2021-03-23 DIAGNOSIS — F250 Schizoaffective disorder, bipolar type: Secondary | ICD-10-CM

## 2021-03-23 NOTE — Telephone Encounter (Signed)
Contacted pt to go over lab results pt is aware and doesn't have any questions or concerns 

## 2021-03-23 NOTE — Progress Notes (Signed)
Patient arrived for weekly lab draw cbc w/ diff for clozapine. Patient pleasant as always.

## 2021-03-27 LAB — CBC WITH DIFFERENTIAL/PLATELET
Basophils Absolute: 0 10*3/uL (ref 0.0–0.2)
Basos: 1 %
EOS (ABSOLUTE): 0.2 10*3/uL (ref 0.0–0.4)
Eos: 4 %
Hematocrit: 37.1 % — ABNORMAL LOW (ref 37.5–51.0)
Hemoglobin: 12 g/dL — ABNORMAL LOW (ref 13.0–17.7)
Immature Grans (Abs): 0 10*3/uL (ref 0.0–0.1)
Immature Granulocytes: 0 %
Lymphocytes Absolute: 1 10*3/uL (ref 0.7–3.1)
Lymphs: 20 %
MCH: 27.8 pg (ref 26.6–33.0)
MCHC: 32.3 g/dL (ref 31.5–35.7)
MCV: 86 fL (ref 79–97)
Monocytes Absolute: 0.4 10*3/uL (ref 0.1–0.9)
Monocytes: 7 %
Neutrophils Absolute: 3.4 10*3/uL (ref 1.4–7.0)
Neutrophils: 68 %
Platelets: 267 10*3/uL (ref 150–450)
RBC: 4.31 x10E6/uL (ref 4.14–5.80)
RDW: 12.8 % (ref 11.6–15.4)
WBC: 5 10*3/uL (ref 3.4–10.8)

## 2021-03-27 LAB — SPECIMEN STATUS REPORT

## 2021-03-28 ENCOUNTER — Other Ambulatory Visit: Payer: Self-pay

## 2021-03-28 ENCOUNTER — Encounter (HOSPITAL_COMMUNITY): Payer: Self-pay | Admitting: Physician Assistant

## 2021-03-28 ENCOUNTER — Ambulatory Visit (INDEPENDENT_AMBULATORY_CARE_PROVIDER_SITE_OTHER): Payer: No Payment, Other | Admitting: Physician Assistant

## 2021-03-28 VITALS — BP 115/78 | HR 89 | Ht 72.0 in | Wt 162.0 lb

## 2021-03-28 DIAGNOSIS — F250 Schizoaffective disorder, bipolar type: Secondary | ICD-10-CM

## 2021-03-28 DIAGNOSIS — G4700 Insomnia, unspecified: Secondary | ICD-10-CM

## 2021-03-28 DIAGNOSIS — F332 Major depressive disorder, recurrent severe without psychotic features: Secondary | ICD-10-CM | POA: Diagnosis not present

## 2021-03-28 MED ORDER — BUPROPION HCL ER (SR) 150 MG PO TB12
150.0000 mg | ORAL_TABLET | Freq: Two times a day (BID) | ORAL | 1 refills | Status: DC
  Filled 2021-03-28: qty 60, 30d supply, fill #0

## 2021-03-28 MED ORDER — MELATONIN 10 MG PO CAPS
10.0000 mg | ORAL_CAPSULE | Freq: Every day | ORAL | 1 refills | Status: DC
  Filled 2021-03-28: qty 30, fill #0
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
## 2021-03-30 ENCOUNTER — Other Ambulatory Visit: Payer: Self-pay

## 2021-03-30 ENCOUNTER — Ambulatory Visit (HOSPITAL_COMMUNITY): Payer: No Payment, Other | Admitting: *Deleted

## 2021-03-30 DIAGNOSIS — F250 Schizoaffective disorder, bipolar type: Secondary | ICD-10-CM

## 2021-03-30 NOTE — Progress Notes (Signed)
Patient arrived for lab draw . Patient talkative shared that he may also volunteer time @ the [facility] as well.

## 2021-03-31 LAB — CBC WITH DIFFERENTIAL/PLATELET
Basophils Absolute: 0 10*3/uL (ref 0.0–0.2)
Basos: 1 %
EOS (ABSOLUTE): 0.1 10*3/uL (ref 0.0–0.4)
Eos: 4 %
Hematocrit: 39.5 % (ref 37.5–51.0)
Hemoglobin: 12.4 g/dL — ABNORMAL LOW (ref 13.0–17.7)
Immature Grans (Abs): 0 10*3/uL (ref 0.0–0.1)
Immature Granulocytes: 0 %
Lymphocytes Absolute: 0.9 10*3/uL (ref 0.7–3.1)
Lymphs: 26 %
MCH: 27.4 pg (ref 26.6–33.0)
MCHC: 31.4 g/dL — ABNORMAL LOW (ref 31.5–35.7)
MCV: 87 fL (ref 79–97)
Monocytes Absolute: 0.3 10*3/uL (ref 0.1–0.9)
Monocytes: 8 %
Neutrophils Absolute: 2.3 10*3/uL (ref 1.4–7.0)
Neutrophils: 61 %
Platelets: 255 10*3/uL (ref 150–450)
RBC: 4.52 x10E6/uL (ref 4.14–5.80)
RDW: 13.2 % (ref 11.6–15.4)
WBC: 3.7 10*3/uL (ref 3.4–10.8)

## 2021-04-06 ENCOUNTER — Ambulatory Visit (HOSPITAL_COMMUNITY): Payer: No Payment, Other | Admitting: *Deleted

## 2021-04-06 ENCOUNTER — Other Ambulatory Visit: Payer: Self-pay

## 2021-04-06 DIAGNOSIS — F251 Schizoaffective disorder, depressive type: Secondary | ICD-10-CM

## 2021-04-06 NOTE — Progress Notes (Signed)
PATIENT FOR INJECTION & STATED THAT HE WAS DOING  WELL.

## 2021-04-07 LAB — CBC WITH DIFFERENTIAL/PLATELET
Basophils Absolute: 0 10*3/uL (ref 0.0–0.2)
Basos: 1 %
EOS (ABSOLUTE): 0.2 10*3/uL (ref 0.0–0.4)
Eos: 4 %
Hematocrit: 36.8 % — ABNORMAL LOW (ref 37.5–51.0)
Hemoglobin: 12 g/dL — ABNORMAL LOW (ref 13.0–17.7)
Immature Grans (Abs): 0 10*3/uL (ref 0.0–0.1)
Immature Granulocytes: 0 %
Lymphocytes Absolute: 1 10*3/uL (ref 0.7–3.1)
Lymphs: 26 %
MCH: 27.7 pg (ref 26.6–33.0)
MCHC: 32.6 g/dL (ref 31.5–35.7)
MCV: 85 fL (ref 79–97)
Monocytes Absolute: 0.4 10*3/uL (ref 0.1–0.9)
Monocytes: 9 %
Neutrophils Absolute: 2.3 10*3/uL (ref 1.4–7.0)
Neutrophils: 60 %
Platelets: 235 10*3/uL (ref 150–450)
RBC: 4.33 x10E6/uL (ref 4.14–5.80)
RDW: 12.8 % (ref 11.6–15.4)
WBC: 3.9 10*3/uL (ref 3.4–10.8)

## 2021-04-13 ENCOUNTER — Other Ambulatory Visit (HOSPITAL_COMMUNITY): Payer: Self-pay | Admitting: *Deleted

## 2021-04-13 ENCOUNTER — Telehealth (HOSPITAL_COMMUNITY): Payer: Self-pay | Admitting: *Deleted

## 2021-04-13 ENCOUNTER — Ambulatory Visit (HOSPITAL_COMMUNITY): Payer: No Payment, Other | Admitting: *Deleted

## 2021-04-13 ENCOUNTER — Other Ambulatory Visit: Payer: Self-pay

## 2021-04-13 ENCOUNTER — Other Ambulatory Visit (HOSPITAL_COMMUNITY): Payer: Self-pay | Admitting: Physician Assistant

## 2021-04-13 DIAGNOSIS — R1312 Dysphagia, oropharyngeal phase: Secondary | ICD-10-CM

## 2021-04-13 DIAGNOSIS — F251 Schizoaffective disorder, depressive type: Secondary | ICD-10-CM

## 2021-04-13 DIAGNOSIS — F332 Major depressive disorder, recurrent severe without psychotic features: Secondary | ICD-10-CM

## 2021-04-13 DIAGNOSIS — G4700 Insomnia, unspecified: Secondary | ICD-10-CM

## 2021-04-13 MED ORDER — BUPROPION HCL ER (SR) 150 MG PO TB12
150.0000 mg | ORAL_TABLET | Freq: Two times a day (BID) | ORAL | 1 refills | Status: DC
  Filled 2021-04-13: qty 60, 30d supply, fill #0

## 2021-04-13 MED ORDER — BUPROPION HCL ER (SR) 150 MG PO TB12: 150.0000 mg | ORAL_TABLET | Freq: Two times a day (BID) | ORAL | 1 refills | Status: DC

## 2021-04-13 MED ORDER — MELATONIN 10 MG PO CAPS: 10.0000 mg | ORAL_CAPSULE | Freq: Every day | ORAL | 1 refills | Status: DC

## 2021-04-13 NOTE — Progress Notes (Signed)
Moved Melatonin Rx to [place] at patients request, stated [facility] did not have the Melatonin available.

## 2021-04-13 NOTE — Telephone Encounter (Signed)
When Sean Shepherd was in the am for his lab draw asked me to move his Melatonin to [place] as [facility] doesn't have stock of it now. I erroneously sent the Wellbturin to [place] not the Melatonin, made the correction.

## 2021-04-13 NOTE — Telephone Encounter (Signed)
Weekly lab draw this am for his CBC related to his taking of Clozapine. Lab sample obtained from his R arm with a straight stick, no difficulty. Patient reports the last two bottles of his Clozapine have only had 13 pills in it instead of 14. Will follow up with the company.

## 2021-04-16 LAB — CBC WITH DIFFERENTIAL/PLATELET
Basophils Absolute: 0 10*3/uL (ref 0.0–0.2)
Basos: 1 %
EOS (ABSOLUTE): 0.2 10*3/uL (ref 0.0–0.4)
Eos: 3 %
Hematocrit: 38.6 % (ref 37.5–51.0)
Hemoglobin: 12.5 g/dL — ABNORMAL LOW (ref 13.0–17.7)
Immature Grans (Abs): 0 10*3/uL (ref 0.0–0.1)
Immature Granulocytes: 0 %
Lymphocytes Absolute: 1 10*3/uL (ref 0.7–3.1)
Lymphs: 22 %
MCH: 27.8 pg (ref 26.6–33.0)
MCHC: 32.4 g/dL (ref 31.5–35.7)
MCV: 86 fL (ref 79–97)
Monocytes Absolute: 0.3 10*3/uL (ref 0.1–0.9)
Monocytes: 7 %
Neutrophils Absolute: 3 10*3/uL (ref 1.4–7.0)
Neutrophils: 67 %
Platelets: 232 10*3/uL (ref 150–450)
RBC: 4.5 x10E6/uL (ref 4.14–5.80)
RDW: 13.1 % (ref 11.6–15.4)
WBC: 4.4 10*3/uL (ref 3.4–10.8)

## 2021-04-20 ENCOUNTER — Other Ambulatory Visit: Payer: Self-pay

## 2021-04-20 ENCOUNTER — Ambulatory Visit (HOSPITAL_COMMUNITY): Payer: No Payment, Other | Admitting: *Deleted

## 2021-04-20 ENCOUNTER — Other Ambulatory Visit (HOSPITAL_COMMUNITY): Payer: Self-pay | Admitting: Physician Assistant

## 2021-04-20 DIAGNOSIS — F251 Schizoaffective disorder, depressive type: Secondary | ICD-10-CM

## 2021-04-20 NOTE — Progress Notes (Signed)
Patient arrived for weekly lab draw.Pleasant as always.

## 2021-04-23 LAB — CBC WITH DIFFERENTIAL/PLATELET
Basophils Absolute: 0 10*3/uL (ref 0.0–0.2)
Basos: 1 %
EOS (ABSOLUTE): 0.1 10*3/uL (ref 0.0–0.4)
Eos: 3 %
Hematocrit: 37.7 % (ref 37.5–51.0)
Hemoglobin: 12.1 g/dL — ABNORMAL LOW (ref 13.0–17.7)
Immature Grans (Abs): 0 10*3/uL (ref 0.0–0.1)
Immature Granulocytes: 0 %
Lymphocytes Absolute: 0.8 10*3/uL (ref 0.7–3.1)
Lymphs: 19 %
MCH: 27.6 pg (ref 26.6–33.0)
MCHC: 32.1 g/dL (ref 31.5–35.7)
MCV: 86 fL (ref 79–97)
Monocytes Absolute: 0.4 10*3/uL (ref 0.1–0.9)
Monocytes: 8 %
Neutrophils Absolute: 2.9 10*3/uL (ref 1.4–7.0)
Neutrophils: 69 %
Platelets: 243 10*3/uL (ref 150–450)
RBC: 4.39 x10E6/uL (ref 4.14–5.80)
RDW: 13.3 % (ref 11.6–15.4)
WBC: 4.3 10*3/uL (ref 3.4–10.8)

## 2021-04-23 LAB — SPECIMEN STATUS REPORT

## 2021-04-27 ENCOUNTER — Other Ambulatory Visit (HOSPITAL_COMMUNITY): Payer: Self-pay | Admitting: Physician Assistant

## 2021-04-27 ENCOUNTER — Other Ambulatory Visit (HOSPITAL_COMMUNITY): Payer: Self-pay | Admitting: *Deleted

## 2021-04-27 ENCOUNTER — Ambulatory Visit: Payer: Self-pay | Attending: Internal Medicine | Admitting: Internal Medicine

## 2021-04-27 ENCOUNTER — Other Ambulatory Visit: Payer: Self-pay

## 2021-04-27 ENCOUNTER — Ambulatory Visit (HOSPITAL_COMMUNITY): Payer: No Payment, Other | Admitting: *Deleted

## 2021-04-27 VITALS — BP 112/76 | HR 88 | Resp 16 | Wt 164.2 lb

## 2021-04-27 DIAGNOSIS — Z2821 Immunization not carried out because of patient refusal: Secondary | ICD-10-CM

## 2021-04-27 DIAGNOSIS — F419 Anxiety disorder, unspecified: Secondary | ICD-10-CM

## 2021-04-27 DIAGNOSIS — Z9079 Acquired absence of other genital organ(s): Secondary | ICD-10-CM

## 2021-04-27 DIAGNOSIS — D649 Anemia, unspecified: Secondary | ICD-10-CM

## 2021-04-27 DIAGNOSIS — R1312 Dysphagia, oropharyngeal phase: Secondary | ICD-10-CM

## 2021-04-27 DIAGNOSIS — F32A Depression, unspecified: Secondary | ICD-10-CM

## 2021-04-27 DIAGNOSIS — F203 Undifferentiated schizophrenia: Secondary | ICD-10-CM

## 2021-04-27 NOTE — Progress Notes (Signed)
Patient arrived for labs @ 8:30 & was pleasant as always. Right arm lab stick tolerated well
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
## 2021-04-30 LAB — CBC WITH DIFFERENTIAL
Basophils Absolute: 0 10*3/uL (ref 0.0–0.2)
Basos: 1 %
EOS (ABSOLUTE): 0.1 10*3/uL (ref 0.0–0.4)
Eos: 2 %
Hematocrit: 39.1 % (ref 37.5–51.0)
Hemoglobin: 12.3 g/dL — ABNORMAL LOW (ref 13.0–17.7)
Immature Grans (Abs): 0 10*3/uL (ref 0.0–0.1)
Immature Granulocytes: 0 %
Lymphocytes Absolute: 1 10*3/uL (ref 0.7–3.1)
Lymphs: 18 %
MCH: 27 pg (ref 26.6–33.0)
MCHC: 31.5 g/dL (ref 31.5–35.7)
MCV: 86 fL (ref 79–97)
Monocytes Absolute: 0.3 10*3/uL (ref 0.1–0.9)
Monocytes: 6 %
Neutrophils Absolute: 4 10*3/uL (ref 1.4–7.0)
Neutrophils: 73 %
RBC: 4.56 x10E6/uL (ref 4.14–5.80)
RDW: 13.8 % (ref 11.6–15.4)
WBC: 5.4 10*3/uL (ref 3.4–10.8)

## 2021-04-30 LAB — SPECIMEN STATUS REPORT

## 2021-05-04 ENCOUNTER — Other Ambulatory Visit: Payer: Self-pay

## 2021-05-04 ENCOUNTER — Ambulatory Visit (HOSPITAL_COMMUNITY): Payer: No Payment, Other | Admitting: *Deleted

## 2021-05-04 DIAGNOSIS — F203 Undifferentiated schizophrenia: Secondary | ICD-10-CM

## 2021-05-04 NOTE — Progress Notes (Signed)
Patient arrived for weekly lab draw. Pleasant as always , R-arm Draw tolerated well.

## 2021-05-05 LAB — CBC WITH DIFFERENTIAL/PLATELET
Basophils Absolute: 0 10*3/uL (ref 0.0–0.2)
Basos: 1 %
EOS (ABSOLUTE): 0.1 10*3/uL (ref 0.0–0.4)
Eos: 2 %
Hematocrit: 37.6 % (ref 37.5–51.0)
Hemoglobin: 12.2 g/dL — ABNORMAL LOW (ref 13.0–17.7)
Immature Grans (Abs): 0 10*3/uL (ref 0.0–0.1)
Immature Granulocytes: 0 %
Lymphocytes Absolute: 0.9 10*3/uL (ref 0.7–3.1)
Lymphs: 15 %
MCH: 27.5 pg (ref 26.6–33.0)
MCHC: 32.4 g/dL (ref 31.5–35.7)
MCV: 85 fL (ref 79–97)
Monocytes Absolute: 0.4 10*3/uL (ref 0.1–0.9)
Monocytes: 6 %
Neutrophils Absolute: 4.5 10*3/uL (ref 1.4–7.0)
Neutrophils: 76 %
Platelets: 214 10*3/uL (ref 150–450)
RBC: 4.44 x10E6/uL (ref 4.14–5.80)
RDW: 14.1 % (ref 11.6–15.4)
WBC: 5.9 10*3/uL (ref 3.4–10.8)

## 2021-05-11 ENCOUNTER — Other Ambulatory Visit: Payer: Self-pay

## 2021-05-11 ENCOUNTER — Ambulatory Visit: Payer: Self-pay | Admitting: Internal Medicine

## 2021-05-11 ENCOUNTER — Ambulatory Visit (HOSPITAL_COMMUNITY): Payer: No Payment, Other | Admitting: *Deleted

## 2021-05-11 DIAGNOSIS — F251 Schizoaffective disorder, depressive type: Secondary | ICD-10-CM

## 2021-05-11 NOTE — Progress Notes (Signed)
In as scheduled for his weekly CBC with diff blood draw. Taken sample from R arm and tolerated well. He is his pleasant self today and offers no complaints. Blood sample sent to [facility].

## 2021-05-12 LAB — CBC WITH DIFFERENTIAL
Basophils Absolute: 0 10*3/uL (ref 0.0–0.2)
Basos: 1 %
EOS (ABSOLUTE): 0.1 10*3/uL (ref 0.0–0.4)
Eos: 2 %
Hematocrit: 37.5 % (ref 37.5–51.0)
Hemoglobin: 11.9 g/dL — ABNORMAL LOW (ref 13.0–17.7)
Immature Grans (Abs): 0 10*3/uL (ref 0.0–0.1)
Immature Granulocytes: 0 %
Lymphocytes Absolute: 0.9 10*3/uL (ref 0.7–3.1)
Lymphs: 22 %
MCH: 27.5 pg (ref 26.6–33.0)
MCHC: 31.7 g/dL (ref 31.5–35.7)
MCV: 87 fL (ref 79–97)
Monocytes Absolute: 0.3 10*3/uL (ref 0.1–0.9)
Monocytes: 7 %
Neutrophils Absolute: 2.8 10*3/uL (ref 1.4–7.0)
Neutrophils: 68 %
RBC: 4.33 x10E6/uL (ref 4.14–5.80)
RDW: 14 % (ref 11.6–15.4)
WBC: 4.1 10*3/uL (ref 3.4–10.8)

## 2021-05-14 ENCOUNTER — Other Ambulatory Visit: Payer: Self-pay

## 2021-05-14 ENCOUNTER — Encounter (HOSPITAL_COMMUNITY): Payer: Self-pay | Admitting: Physician Assistant

## 2021-05-14 ENCOUNTER — Ambulatory Visit (INDEPENDENT_AMBULATORY_CARE_PROVIDER_SITE_OTHER): Payer: No Payment, Other | Admitting: Physician Assistant

## 2021-05-14 VITALS — BP 120/74 | HR 83 | Ht 72.0 in | Wt 164.0 lb

## 2021-05-14 DIAGNOSIS — G4700 Insomnia, unspecified: Secondary | ICD-10-CM

## 2021-05-14 DIAGNOSIS — F411 Generalized anxiety disorder: Secondary | ICD-10-CM | POA: Diagnosis not present

## 2021-05-14 DIAGNOSIS — F250 Schizoaffective disorder, bipolar type: Secondary | ICD-10-CM | POA: Diagnosis not present

## 2021-05-14 DIAGNOSIS — F332 Major depressive disorder, recurrent severe without psychotic features: Secondary | ICD-10-CM | POA: Diagnosis not present

## 2021-05-14 MED ORDER — BUPROPION HCL ER (SR) 150 MG PO TB12
150.0000 mg | ORAL_TABLET | Freq: Two times a day (BID) | ORAL | 1 refills | Status: DC
  Filled 2021-05-14 – 2021-06-11 (×3): qty 60, 30d supply, fill #0

## 2021-05-14 MED ORDER — MELATONIN 10 MG PO CAPS: 10.0000 mg | ORAL_CAPSULE | Freq: Every day | ORAL | 1 refills | Status: DC
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
## 2021-05-18 ENCOUNTER — Other Ambulatory Visit: Payer: Self-pay

## 2021-05-18 ENCOUNTER — Ambulatory Visit (HOSPITAL_COMMUNITY): Payer: No Payment, Other | Admitting: *Deleted

## 2021-05-18 DIAGNOSIS — F250 Schizoaffective disorder, bipolar type: Secondary | ICD-10-CM

## 2021-05-18 NOTE — Progress Notes (Signed)
In as scheduled for his weekly lab draw for a CBC related to his Clozaril use. Drew his lab from his L inner arm without issue. States he continues to struggle with high anxiety but denies psychotic sx. Will return on Tues to pick up his Clozaril.

## 2021-05-19 LAB — CBC WITH DIFFERENTIAL
Basophils Absolute: 0 10*3/uL (ref 0.0–0.2)
Basos: 1 %
EOS (ABSOLUTE): 0.1 10*3/uL (ref 0.0–0.4)
Eos: 2 %
Hematocrit: 38 % (ref 37.5–51.0)
Hemoglobin: 12.5 g/dL — ABNORMAL LOW (ref 13.0–17.7)
Immature Grans (Abs): 0 10*3/uL (ref 0.0–0.1)
Immature Granulocytes: 0 %
Lymphocytes Absolute: 1.1 10*3/uL (ref 0.7–3.1)
Lymphs: 25 %
MCH: 28 pg (ref 26.6–33.0)
MCHC: 32.9 g/dL (ref 31.5–35.7)
MCV: 85 fL (ref 79–97)
Monocytes Absolute: 0.4 10*3/uL (ref 0.1–0.9)
Monocytes: 8 %
Neutrophils Absolute: 2.8 10*3/uL (ref 1.4–7.0)
Neutrophils: 64 %
RBC: 4.47 x10E6/uL (ref 4.14–5.80)
RDW: 14.5 % (ref 11.6–15.4)
WBC: 4.4 10*3/uL (ref 3.4–10.8)

## 2021-05-21 ENCOUNTER — Other Ambulatory Visit: Payer: Self-pay

## 2021-05-25 ENCOUNTER — Ambulatory Visit (HOSPITAL_COMMUNITY): Payer: No Payment, Other | Admitting: *Deleted

## 2021-05-25 ENCOUNTER — Other Ambulatory Visit: Payer: Self-pay

## 2021-05-25 DIAGNOSIS — F250 Schizoaffective disorder, bipolar type: Secondary | ICD-10-CM

## 2021-05-25 NOTE — Progress Notes (Signed)
Patient arrived for weekly lab draw& tolerated well. Patient shared his Sister & Nieces are coming to visit the weekend.

## 2021-05-26 LAB — CBC WITH DIFFERENTIAL/PLATELET
Basophils Absolute: 0 10*3/uL (ref 0.0–0.2)
Basos: 0 %
EOS (ABSOLUTE): 0.1 10*3/uL (ref 0.0–0.4)
Eos: 1 %
Hematocrit: 37.9 % (ref 37.5–51.0)
Hemoglobin: 12.3 g/dL — ABNORMAL LOW (ref 13.0–17.7)
Immature Grans (Abs): 0 10*3/uL (ref 0.0–0.1)
Immature Granulocytes: 0 %
Lymphocytes Absolute: 0.9 10*3/uL (ref 0.7–3.1)
Lymphs: 19 %
MCH: 27.6 pg (ref 26.6–33.0)
MCHC: 32.5 g/dL (ref 31.5–35.7)
MCV: 85 fL (ref 79–97)
Monocytes Absolute: 0.3 10*3/uL (ref 0.1–0.9)
Monocytes: 6 %
Neutrophils Absolute: 3.7 10*3/uL (ref 1.4–7.0)
Neutrophils: 74 %
Platelets: 240 10*3/uL (ref 150–450)
RBC: 4.46 x10E6/uL (ref 4.14–5.80)
RDW: 14.4 % (ref 11.6–15.4)
WBC: 5 10*3/uL (ref 3.4–10.8)

## 2021-06-01 ENCOUNTER — Other Ambulatory Visit (HOSPITAL_COMMUNITY): Payer: Self-pay | Admitting: Physician Assistant

## 2021-06-01 ENCOUNTER — Ambulatory Visit (HOSPITAL_COMMUNITY): Payer: No Payment, Other | Admitting: *Deleted

## 2021-06-01 ENCOUNTER — Other Ambulatory Visit: Payer: Self-pay

## 2021-06-01 DIAGNOSIS — F250 Schizoaffective disorder, bipolar type: Secondary | ICD-10-CM

## 2021-06-01 NOTE — Progress Notes (Signed)
In as scheduled for weekly blood draw for a CBC w diff related to his taking of Clozaril. Drew sample from R arm without difficulty. Lab # [id number]. Sent to [facility] to be processed.

## 2021-06-04 LAB — CBC WITH DIFFERENTIAL
Basophils Absolute: 0 10*3/uL (ref 0.0–0.2)
Basos: 1 %
EOS (ABSOLUTE): 0.1 10*3/uL (ref 0.0–0.4)
Eos: 2 %
Hematocrit: 40.5 % (ref 37.5–51.0)
Hemoglobin: 12.8 g/dL — ABNORMAL LOW (ref 13.0–17.7)
Immature Grans (Abs): 0 10*3/uL (ref 0.0–0.1)
Immature Granulocytes: 0 %
Lymphocytes Absolute: 1.1 10*3/uL (ref 0.7–3.1)
Lymphs: 23 %
MCH: 28.1 pg (ref 26.6–33.0)
MCHC: 31.6 g/dL (ref 31.5–35.7)
MCV: 89 fL (ref 79–97)
Monocytes Absolute: 0.4 10*3/uL (ref 0.1–0.9)
Monocytes: 7 %
Neutrophils Absolute: 3.3 10*3/uL (ref 1.4–7.0)
Neutrophils: 67 %
RBC: 4.56 x10E6/uL (ref 4.14–5.80)
RDW: 14.6 % (ref 11.6–15.4)
WBC: 4.8 10*3/uL (ref 3.4–10.8)

## 2021-06-08 ENCOUNTER — Ambulatory Visit (HOSPITAL_COMMUNITY): Payer: No Payment, Other | Admitting: *Deleted

## 2021-06-08 ENCOUNTER — Other Ambulatory Visit: Payer: Self-pay

## 2021-06-08 VITALS — BP 119/71 | HR 90 | Temp 98.0°F

## 2021-06-08 DIAGNOSIS — F250 Schizoaffective disorder, bipolar type: Secondary | ICD-10-CM

## 2021-06-08 NOTE — Progress Notes (Signed)
In as scheduled for weekly cbc w diff lab draw. Today drawn from his R arm without issues. Vitals taken today as well. His only complaint is ongoing anxiety. He has plans to work with his father on house and yard projects this weekend. He did appeal his disability claim and has a phone appt with them in a week. BP-119/71, temp-98.0, pulse -90, O2-100%

## 2021-06-09 LAB — CBC WITH DIFFERENTIAL/PLATELET
Basophils Absolute: 0 10*3/uL (ref 0.0–0.2)
Basos: 1 %
EOS (ABSOLUTE): 0.1 10*3/uL (ref 0.0–0.4)
Eos: 2 %
Hematocrit: 37.6 % (ref 37.5–51.0)
Hemoglobin: 12.1 g/dL — ABNORMAL LOW (ref 13.0–17.7)
Immature Grans (Abs): 0 10*3/uL (ref 0.0–0.1)
Immature Granulocytes: 0 %
Lymphocytes Absolute: 0.8 10*3/uL (ref 0.7–3.1)
Lymphs: 18 %
MCH: 28.2 pg (ref 26.6–33.0)
MCHC: 32.2 g/dL (ref 31.5–35.7)
MCV: 88 fL (ref 79–97)
Monocytes Absolute: 0.3 10*3/uL (ref 0.1–0.9)
Monocytes: 6 %
Neutrophils Absolute: 3.2 10*3/uL (ref 1.4–7.0)
Neutrophils: 73 %
Platelets: 235 10*3/uL (ref 150–450)
RBC: 4.29 x10E6/uL (ref 4.14–5.80)
RDW: 14.7 % (ref 11.6–15.4)
WBC: 4.5 10*3/uL (ref 3.4–10.8)

## 2021-06-11 ENCOUNTER — Other Ambulatory Visit: Payer: Self-pay

## 2021-06-12 ENCOUNTER — Other Ambulatory Visit: Payer: Self-pay

## 2021-06-15 ENCOUNTER — Other Ambulatory Visit: Payer: Self-pay

## 2021-06-15 ENCOUNTER — Ambulatory Visit (HOSPITAL_COMMUNITY): Payer: No Payment, Other | Admitting: *Deleted

## 2021-06-15 DIAGNOSIS — F250 Schizoaffective disorder, bipolar type: Secondary | ICD-10-CM

## 2021-06-15 NOTE — Progress Notes (Signed)
[phone] for lab drawn this am in R arm without issue. He continues to report a lot of anxiety and plans to move one of his pills to am. Will discuss with [name] PA an increase in his dose to help stave off the anxiety, he states it hits him in the face as soon as he wakes up. Has plans to work with his dad around the apartment he is fixing up for one of his daughters. To return tues for his med.

## 2021-06-16 LAB — CBC WITH DIFFERENTIAL/PLATELET
Basophils Absolute: 0 10*3/uL (ref 0.0–0.2)
Basos: 1 %
EOS (ABSOLUTE): 0.1 10*3/uL (ref 0.0–0.4)
Eos: 1 %
Hematocrit: 38.5 % (ref 37.5–51.0)
Hemoglobin: 12.7 g/dL — ABNORMAL LOW (ref 13.0–17.7)
Immature Grans (Abs): 0 10*3/uL (ref 0.0–0.1)
Immature Granulocytes: 0 %
Lymphocytes Absolute: 1 10*3/uL (ref 0.7–3.1)
Lymphs: 23 %
MCH: 28.4 pg (ref 26.6–33.0)
MCHC: 33 g/dL (ref 31.5–35.7)
MCV: 86 fL (ref 79–97)
Monocytes Absolute: 0.4 10*3/uL (ref 0.1–0.9)
Monocytes: 8 %
Neutrophils Absolute: 3 10*3/uL (ref 1.4–7.0)
Neutrophils: 67 %
Platelets: 253 10*3/uL (ref 150–450)
RBC: 4.47 x10E6/uL (ref 4.14–5.80)
RDW: 14.6 % (ref 11.6–15.4)
WBC: 4.4 10*3/uL (ref 3.4–10.8)

## 2021-06-22 ENCOUNTER — Ambulatory Visit (HOSPITAL_COMMUNITY): Payer: No Payment, Other | Admitting: *Deleted

## 2021-06-22 ENCOUNTER — Other Ambulatory Visit: Payer: Self-pay

## 2021-06-22 ENCOUNTER — Other Ambulatory Visit (HOSPITAL_COMMUNITY): Payer: Self-pay | Admitting: Physician Assistant

## 2021-06-22 DIAGNOSIS — F250 Schizoaffective disorder, bipolar type: Secondary | ICD-10-CM

## 2021-06-22 NOTE — Progress Notes (Signed)
Patient arrived for weekly blood draws. Patient says he doing  well.& Pleasant always

## 2021-06-25 LAB — CBC WITH DIFFERENTIAL/PLATELET
Basophils Absolute: 0 10*3/uL (ref 0.0–0.2)
Basos: 0 %
EOS (ABSOLUTE): 0.1 10*3/uL (ref 0.0–0.4)
Eos: 2 %
Hematocrit: 37.1 % — ABNORMAL LOW (ref 37.5–51.0)
Hemoglobin: 12.2 g/dL — ABNORMAL LOW (ref 13.0–17.7)
Immature Grans (Abs): 0 10*3/uL (ref 0.0–0.1)
Immature Granulocytes: 0 %
Lymphocytes Absolute: 0.9 10*3/uL (ref 0.7–3.1)
Lymphs: 19 %
MCH: 28.6 pg (ref 26.6–33.0)
MCHC: 32.9 g/dL (ref 31.5–35.7)
MCV: 87 fL (ref 79–97)
Monocytes Absolute: 0.3 10*3/uL (ref 0.1–0.9)
Monocytes: 7 %
Neutrophils Absolute: 3.3 10*3/uL (ref 1.4–7.0)
Neutrophils: 72 %
Platelets: 245 10*3/uL (ref 150–450)
RBC: 4.26 x10E6/uL (ref 4.14–5.80)
RDW: 14.2 % (ref 11.6–15.4)
WBC: 4.6 10*3/uL (ref 3.4–10.8)

## 2021-06-25 LAB — SPECIMEN STATUS REPORT

## 2021-06-29 ENCOUNTER — Ambulatory Visit (HOSPITAL_COMMUNITY): Payer: No Payment, Other | Admitting: *Deleted

## 2021-06-29 ENCOUNTER — Other Ambulatory Visit: Payer: Self-pay

## 2021-06-29 DIAGNOSIS — F250 Schizoaffective disorder, bipolar type: Secondary | ICD-10-CM

## 2021-06-29 NOTE — Progress Notes (Signed)
In as scheduled for his weekly CBC w Diff lab draw. No issues today with blood draw. Lab req is [id number]. To pick up his Clozaril on Tues and return for lab draw on fri a week.

## 2021-06-30 LAB — CBC WITH DIFFERENTIAL/PLATELET
Basophils Absolute: 0 10*3/uL (ref 0.0–0.2)
Basos: 1 %
EOS (ABSOLUTE): 0.1 10*3/uL (ref 0.0–0.4)
Eos: 1 %
Hematocrit: 37.5 % (ref 37.5–51.0)
Hemoglobin: 12.2 g/dL — ABNORMAL LOW (ref 13.0–17.7)
Immature Grans (Abs): 0 10*3/uL (ref 0.0–0.1)
Immature Granulocytes: 0 %
Lymphocytes Absolute: 1 10*3/uL (ref 0.7–3.1)
Lymphs: 15 %
MCH: 28.5 pg (ref 26.6–33.0)
MCHC: 32.5 g/dL (ref 31.5–35.7)
MCV: 88 fL (ref 79–97)
Monocytes Absolute: 0.4 10*3/uL (ref 0.1–0.9)
Monocytes: 7 %
Neutrophils Absolute: 4.9 10*3/uL (ref 1.4–7.0)
Neutrophils: 76 %
Platelets: 248 10*3/uL (ref 150–450)
RBC: 4.28 x10E6/uL (ref 4.14–5.80)
RDW: 14 % (ref 11.6–15.4)
WBC: 6.4 10*3/uL (ref 3.4–10.8)

## 2021-07-06 ENCOUNTER — Ambulatory Visit (HOSPITAL_COMMUNITY): Payer: No Payment, Other | Admitting: *Deleted

## 2021-07-06 DIAGNOSIS — F250 Schizoaffective disorder, bipolar type: Secondary | ICD-10-CM

## 2021-07-06 NOTE — Progress Notes (Signed)
Patient arrived for weekly CLozaril lab draw. Patient Pleasant as Always & Tolerated lab draw well in Right-Rm

## 2021-07-07 LAB — CBC WITH DIFFERENTIAL
Basophils Absolute: 0 10*3/uL (ref 0.0–0.2)
Basos: 0 %
EOS (ABSOLUTE): 0.1 10*3/uL (ref 0.0–0.4)
Eos: 1 %
Hematocrit: 37.9 % (ref 37.5–51.0)
Hemoglobin: 12.4 g/dL — ABNORMAL LOW (ref 13.0–17.7)
Immature Grans (Abs): 0 10*3/uL (ref 0.0–0.1)
Immature Granulocytes: 0 %
Lymphocytes Absolute: 0.9 10*3/uL (ref 0.7–3.1)
Lymphs: 20 %
MCH: 28.2 pg (ref 26.6–33.0)
MCHC: 32.7 g/dL (ref 31.5–35.7)
MCV: 86 fL (ref 79–97)
Monocytes Absolute: 0.3 10*3/uL (ref 0.1–0.9)
Monocytes: 6 %
Neutrophils Absolute: 3.2 10*3/uL (ref 1.4–7.0)
Neutrophils: 73 %
RBC: 4.39 x10E6/uL (ref 4.14–5.80)
RDW: 13.7 % (ref 11.6–15.4)
WBC: 4.5 10*3/uL (ref 3.4–10.8)

## 2021-07-13 ENCOUNTER — Ambulatory Visit (HOSPITAL_COMMUNITY): Payer: No Payment, Other | Admitting: *Deleted

## 2021-07-13 ENCOUNTER — Other Ambulatory Visit: Payer: Self-pay

## 2021-07-13 ENCOUNTER — Ambulatory Visit (INDEPENDENT_AMBULATORY_CARE_PROVIDER_SITE_OTHER): Payer: No Payment, Other | Admitting: Physician Assistant

## 2021-07-13 ENCOUNTER — Other Ambulatory Visit (HOSPITAL_COMMUNITY): Payer: Self-pay | Admitting: Physician Assistant

## 2021-07-13 ENCOUNTER — Encounter (HOSPITAL_COMMUNITY): Payer: Self-pay | Admitting: Physician Assistant

## 2021-07-13 ENCOUNTER — Encounter (HOSPITAL_COMMUNITY): Payer: Self-pay

## 2021-07-13 VITALS — BP 109/69 | HR 86 | Temp 97.0°F

## 2021-07-13 DIAGNOSIS — F411 Generalized anxiety disorder: Secondary | ICD-10-CM | POA: Diagnosis not present

## 2021-07-13 DIAGNOSIS — G4700 Insomnia, unspecified: Secondary | ICD-10-CM

## 2021-07-13 DIAGNOSIS — F332 Major depressive disorder, recurrent severe without psychotic features: Secondary | ICD-10-CM | POA: Diagnosis not present

## 2021-07-13 DIAGNOSIS — F250 Schizoaffective disorder, bipolar type: Secondary | ICD-10-CM

## 2021-07-13 MED ORDER — VENLAFAXINE HCL ER 37.5 MG PO CP24
37.5000 mg | ORAL_CAPSULE | Freq: Every day | ORAL | 0 refills | Status: DC
Start: 2021-07-13 — End: 2021-08-15
  Filled 2021-07-13 – 2021-07-17 (×2): qty 30, 30d supply, fill #0

## 2021-07-13 MED ORDER — MIRTAZAPINE 15 MG PO TABS
15.0000 mg | ORAL_TABLET | Freq: Every day | ORAL | 1 refills | Status: DC
  Filled 2021-07-13: qty 30, 30d supply, fill #0

## 2021-07-13 NOTE — Progress Notes (Signed)
BH MD/PA/NP OP Progress Note  07/13/2021 10:13 PM Lino Wickliff  MRN:  [id number]  Chief Complaint: Follow up and medication management  HPI:   Sean Shepherd is a 45 year old male with a past psychiatric history significant for generalized anxiety disorder, schizoaffective disorder (bipolar type), insomnia and generalized anxiety disorder who presents to [facility] for follow-up and medication management.  Patient is currently being managed on the following medications:  Melatonin 10 mg at bedtime Clozaril 200 mg at bedtime Bupropion (Wellbutrin SR) 150 mg 12-hour tablet 2 times daily  Patient continues to endorse issues with his anxiety and states that it has been controlling his life.  He states that he has been able to get sleep and push through his day, but describes his anxiety as extremely impactful.  Patient rates his anxiety an 8 out of 10.  Patient denies any new stressors at this time but states that he continues to ruminate over the past events of injuring himself back in April.  He reports that he hears voices telling him that he a bad person and that he is going to go to jail for all of his past actions.  Patient endorses some depression, but states that he mostly experiences anxiety.  He does endorse having no motivation to do anything.  He reports keeping himself busy by helping his father out with projects, but can still feel his depression under the surface.  Patient endorses the following depressive symptoms: Low energy, difficulty getting out of bed, feelings of guilt/worthlessness, and hopelessness.  A PHQ-9 screen was performed with the patient scoring a 17.  A GAD-7 screen was also performed with the patient scoring a 17.  A [place] Suicide Severity Rating Scale was performed with the patient being considered moderate risk.  Patient reports not being a danger to himself and is able to contract for safety following the conclusion of  the encounter.  Patient is alert and oriented x4, calm, cooperative, and fully engaged in conversation during the encounter.  Patient states that he is relatively calm right now but states that his demeanor can shift into anxiety at any moment.  Patient denies suicidal or homicidal ideations.  He denies active auditory or visual hallucinations but states that he occasionally hears voices during the day.  Patient does not appear to be responding to internal/external stimuli.  Patient endorses good sleep and receives on average 8 hours of sleep each night.  Patient endorses good appetite and eats on average 3 meals per day.  Patient endorses alcohol consumption sparingly.  Patient denies tobacco use and illicit drug use.  Visit Diagnosis:    ICD-10-CM   1. Generalized anxiety disorder  F41.1 venlafaxine XR (EFFEXOR XR) 37.5 MG 24 hr capsule    DISCONTINUED: mirtazapine (REMERON) 15 MG tablet    2. Severe episode of recurrent major depressive disorder, without psychotic features (HCC)  F33.2 venlafaxine XR (EFFEXOR XR) 37.5 MG 24 hr capsule    DISCONTINUED: mirtazapine (REMERON) 15 MG tablet    3. Insomnia, unspecified type  G47.00       Past Psychiatric History:  Schizoaffective disorder (bipolar type) Major depressive disorder Insomnia Generalized anxiety disorder  Past Medical History:  Past Medical History:  Diagnosis Date   Anxiety disorder, unspecified 01/10/2021   Bipolar disorder (HCC)    Depression    Schizoaffective disorder (HCC)     Past Surgical History:  Procedure Laterality Date   LAPAROTOMY N/A 12/25/2020   Procedure: EXPLORATORY LAPAROTOMY; REPAIR  OF GROIN LACERATIONS X2, RIGHT THIGH LACERATION X 2; REPAIR RIGHT NECK AND LEFT NECK LACERATION AND REPAIR OF LEFT WRIST LACERATION;  Surgeon: [name], MD;  Location: [facility] OR;  Service: General;  Laterality: N/A;   NO PAST SURGERIES      Family Psychiatric History:  Father - Depression, currently taking Zoloft Sister  - unsure, hx of trouble with the law, unsure of med taken  Family History: History reviewed. No pertinent family history.  Social History:  Social History   Socioeconomic History   Marital status: Single    Spouse name: Not on file   Number of children: 0   Years of education: Not on file   Highest education level: Not on file  Occupational History   Not on file  Tobacco Use   Smoking status: Former    Types: Cigarettes    Quit date: 07/31/2014    Years since quitting: 6.9   Smokeless tobacco: Never  Vaping Use   Vaping Use: Every day   Substances: THC, CBD  Substance and Sexual Activity   Alcohol use: Not Currently   Drug use: Yes    Types: Marijuana   Sexual activity: Never  Other Topics Concern   Not on file  Social History Narrative   Not on file   Social Determinants of Health   Financial Resource Strain: Not on file  Food Insecurity: Not on file  Transportation Needs: Not on file  Physical Activity: Not on file  Stress: Not on file  Social Connections: Not on file    Allergies: No Known Allergies  Metabolic Disorder Labs: Lab Results  Component Value Date   HGBA1C 5.2 01/09/2021   MPG 102.54 01/09/2021   MPG 117 06/28/2020   No results found for: PROLACTIN Lab Results  Component Value Date   CHOL 188 11/18/2020   TRIG 124 11/18/2020   HDL 69 11/18/2020   CHOLHDL 2.7 11/18/2020   VLDL 25 11/18/2020   LDLCALC 94 11/18/2020   LDLCALC 128 (H) 06/28/2020   Lab Results  Component Value Date   TSH 0.828 01/25/2021   TSH 0.817 11/18/2020    Therapeutic Level Labs: No results found for: LITHIUM No results found for: VALPROATE No components found for:  CBMZ  Current Medications: Current Outpatient Medications  Medication Sig Dispense Refill   venlafaxine XR (EFFEXOR XR) 37.5 MG 24 hr capsule Take 1 capsule (37.5 mg total) by mouth daily. 30 capsule 0   buPROPion (WELLBUTRIN SR) 150 MG 12 hr tablet Take 1 tablet (150 mg total) by mouth 2 (two)  times daily. 60 tablet 1   clozapine (CLOZARIL) 200 MG tablet Take 1 tablet (200 mg total) by mouth at bedtime. 30 tablet 0   Melatonin 10 MG CAPS Take 10 mg by mouth at bedtime. 30 capsule 1   No current facility-administered medications for this visit.     Musculoskeletal: Strength & Muscle Tone: within normal limits Gait & Station: normal Patient leans: N/A  Psychiatric Specialty Exam: Review of Systems  Psychiatric/Behavioral:  Positive for hallucinations. Negative for decreased concentration, dysphoric mood, self-injury, sleep disturbance and suicidal ideas. The patient is nervous/anxious. The patient is not hyperactive.    There were no vitals taken for this visit.There is no height or weight on file to calculate BMI.  General Appearance: Well Groomed  Eye Contact:  Good  Speech:  Clear and Coherent and Normal Rate  Volume:  Normal  Mood:  Anxious and Depressed  Affect:  Congruent  Thought  Process:  Coherent, Goal Directed, and Descriptions of Associations: Intact  Orientation:  Full (Time, Place, and Person)  Thought Content: WDL, Hallucinations: Auditory, and Rumination   Suicidal Thoughts:  No  Homicidal Thoughts:  No  Memory:  Immediate;   Good Recent;   Good Remote;   Good  Judgement:  Good  Insight:  Fair  Psychomotor Activity:  Normal  Concentration:  Concentration: Good and Attention Span: Good  Recall:  Good  Fund of Knowledge: Good  Language: Good  Akathisia:  NA  Handed:  Right  AIMS (if indicated): not done  Assets:  Communication Skills Desire for Improvement Housing Social Support  ADL's:  Intact  Cognition: WNL  Sleep:  Good   Screenings: AIMS    Flowsheet Row [facility]  AIMS Total Score 0      CAGE-AID    Flowsheet Row ED to Hosp-Admission (Discharged) from 12/25/2020 in [facility]  SURGICAL  CAGE-AID Score 0      GAD-7     Flowsheet Row Office Visit from 07/13/2021 in [facility] Office Visit from 05/14/2021 in [facility] Office Visit from 04/27/2021 in [facility] Clinical Support from 03/28/2021 in [facility] Office Visit from 03/15/2021 in [facility]  Total GAD-7 Score 17 18 18 18 17       PHQ2-9    Flowsheet Row Office Visit from 07/13/2021 in [facility] Office Visit from 05/14/2021 in [facility] Office Visit from 04/27/2021 in [facility] Clinical Support from 03/28/2021 in [facility] Office Visit from 03/15/2021 in [facility]  PHQ-2 Total Score 6 4 6 6 6   PHQ-9 Total Score 17 16 18 17 16       Flowsheet Row Office Visit from 07/13/2021 in [facility] Office Visit from 05/14/2021 in [facility] Clinical Support from 03/28/2021 in [facility]  C-SSRS RISK CATEGORY Moderate Risk Moderate Risk Moderate Risk        Assessment and Plan:   Tennessee Routson is a 45 year old male with a past psychiatric history significant for generalized anxiety disorder, schizoaffective disorder (bipolar type), insomnia and generalized anxiety disorder who presents to [facility] for follow-up and medication management.  Patient's main concern today is his anxiety which he states continues to control aspects of his life.  Patient endorses some depressive symptoms which he tries to alleviate by keeping himself busy.  Patient states that he still continues to ruminate over his past actions of injuring himself.  Patient is interested in being placed on a medication that helps to manage his anxiety.  Patient was recommended  venlafaxine 37.5 mg daily for the management of his depressive symptoms and anxiety.  The patient was also recommended mirtazapine 15 mg at bedtime for the management of his depressive symptoms, anxiety, and/or sleep disturbances.  Patient was agreeable to recommendations.  Patient's medications to be e-prescribed to pharmacy of choice.  Patient to discontinue taking bupropion (Wellbutrin SR) 150 mg 2 times daily.  1. Severe episode of recurrent major depressive disorder, without psychotic features (HCC)  - venlafaxine XR (EFFEXOR XR) 37.5 MG 24 hr capsule. Take 1 capsule (37.5 mg total) by mouth daily.  Dispense: 30 capsule;  Refill: 0 - mirtazapine (REMERON) 15 MG tablet.  Take 1 tablet (15 mg total) by mouth at bedtime. Dispense: 30 tablet; Refill: 1   2. Insomnia, unspecified type Patient to continue taking mirtazapine 15 mg at bedtime for the management of his insomnia  3. Generalized anxiety disorder  - venlafaxine XR (EFFEXOR XR) 37.5 MG 24 hr capsule. Take 1 capsule (37.5 mg total) by mouth daily.  Dispense: 30 capsule; Refill: 0 - mirtazapine (REMERON) 15 MG tablet.  Take 1 tablet (15 mg total) by mouth at bedtime. Dispense: 30 tablet; Refill: 1  Patient to follow up in 4 weeks Provider spent a total of 17 minutes with the patient/reviewing patient's chart  Meta Hatchet, PA 07/13/2021, 10:13 PM

## 2021-07-13 NOTE — Progress Notes (Signed)
In for weekly lab draw for his CBD w diff related to taking Clozaril. Today after lab draw he is also seeing his provider. Lab frequency is changing going forward as he has been doing weekly for 6 months and now will move to every two weeks. Lab drawn in R arm without issue. Sent specimen to lab.

## 2021-07-16 LAB — CBC WITH DIFFERENTIAL/PLATELET
Basophils Absolute: 0 10*3/uL (ref 0.0–0.2)
Basos: 1 %
EOS (ABSOLUTE): 0.1 10*3/uL (ref 0.0–0.4)
Eos: 1 %
Hematocrit: 38.4 % (ref 37.5–51.0)
Hemoglobin: 12.5 g/dL — ABNORMAL LOW (ref 13.0–17.7)
Immature Grans (Abs): 0 10*3/uL (ref 0.0–0.1)
Immature Granulocytes: 0 %
Lymphocytes Absolute: 1 10*3/uL (ref 0.7–3.1)
Lymphs: 15 %
MCH: 28.6 pg (ref 26.6–33.0)
MCHC: 32.6 g/dL (ref 31.5–35.7)
MCV: 88 fL (ref 79–97)
Monocytes Absolute: 0.4 10*3/uL (ref 0.1–0.9)
Monocytes: 6 %
Neutrophils Absolute: 4.9 10*3/uL (ref 1.4–7.0)
Neutrophils: 77 %
Platelets: 247 10*3/uL (ref 150–450)
RBC: 4.37 x10E6/uL (ref 4.14–5.80)
RDW: 13.6 % (ref 11.6–15.4)
WBC: 6.4 10*3/uL (ref 3.4–10.8)

## 2021-07-17 ENCOUNTER — Encounter (HOSPITAL_COMMUNITY): Payer: Federal, State, Local not specified - Other | Admitting: Physician Assistant

## 2021-07-17 ENCOUNTER — Other Ambulatory Visit: Payer: Self-pay

## 2021-07-17 ENCOUNTER — Telehealth (HOSPITAL_COMMUNITY): Payer: Self-pay | Admitting: *Deleted

## 2021-07-17 NOTE — Telephone Encounter (Signed)
Patient came in to pick up his meds and asked if I was giving him two weeks since he is now on an every two week lab draw schedule. I only had a week for him since meds just changed frequency. I called [facility], she stated she had noticed in the REMS site his lab frequency has changed and when she gets a new rx or escribe rx from [place] she will send out the additional week. Will notify [name] PA to escribe two weeks rx for Clozaril going forward. Had changed his frequency in the REMS system this am.

## 2021-07-18 ENCOUNTER — Other Ambulatory Visit (HOSPITAL_COMMUNITY): Payer: Self-pay | Admitting: Physician Assistant

## 2021-07-18 DIAGNOSIS — F250 Schizoaffective disorder, bipolar type: Secondary | ICD-10-CM

## 2021-07-18 MED ORDER — CLOZAPINE 100 MG PO TABS: 200.0000 mg | ORAL_TABLET | Freq: Every day | ORAL | 3 refills | Status: DC

## 2021-07-18 NOTE — Progress Notes (Signed)
Provider e-prescribed patient's medication (Clozaril 200 mg [two 100 mg tablets] at bedtime; 3 refills) to preferred pharmacy. Will follow up with patient's nurse to verify prescription order.

## 2021-07-20 ENCOUNTER — Ambulatory Visit (HOSPITAL_COMMUNITY): Payer: Self-pay

## 2021-07-25 ENCOUNTER — Ambulatory Visit (HOSPITAL_COMMUNITY): Payer: No Payment, Other | Admitting: *Deleted

## 2021-07-25 ENCOUNTER — Other Ambulatory Visit (HOSPITAL_COMMUNITY): Payer: Self-pay | Admitting: Physician Assistant

## 2021-07-25 ENCOUNTER — Other Ambulatory Visit: Payer: Self-pay

## 2021-07-25 DIAGNOSIS — F250 Schizoaffective disorder, bipolar type: Secondary | ICD-10-CM

## 2021-07-25 NOTE — Progress Notes (Signed)
Patient arrived for his now bi-weekly lab draw. Pleasant as always & looking forward to the holidays with the family.

## 2021-07-30 LAB — CBC/DIFF AMBIGUOUS DEFAULT
Basophils Absolute: 0 10*3/uL (ref 0.0–0.2)
Basos: 1 %
EOS (ABSOLUTE): 0.1 10*3/uL (ref 0.0–0.4)
Eos: 2 %
Hematocrit: 38.4 % (ref 37.5–51.0)
Hemoglobin: 12.5 g/dL — ABNORMAL LOW (ref 13.0–17.7)
Immature Grans (Abs): 0 10*3/uL (ref 0.0–0.1)
Immature Granulocytes: 0 %
Lymphocytes Absolute: 1.1 10*3/uL (ref 0.7–3.1)
Lymphs: 20 %
MCH: 28.7 pg (ref 26.6–33.0)
MCHC: 32.6 g/dL (ref 31.5–35.7)
MCV: 88 fL (ref 79–97)
Monocytes Absolute: 0.4 10*3/uL (ref 0.1–0.9)
Monocytes: 7 %
Neutrophils Absolute: 3.9 10*3/uL (ref 1.4–7.0)
Neutrophils: 70 %
Platelets: 251 10*3/uL (ref 150–450)
RBC: 4.36 x10E6/uL (ref 4.14–5.80)
RDW: 13 % (ref 11.6–15.4)
WBC: 5.5 10*3/uL (ref 3.4–10.8)

## 2021-07-30 LAB — SPECIMEN STATUS REPORT

## 2021-08-03 ENCOUNTER — Other Ambulatory Visit: Payer: Self-pay

## 2021-08-03 ENCOUNTER — Ambulatory Visit (HOSPITAL_COMMUNITY): Payer: No Payment, Other | Admitting: *Deleted

## 2021-08-03 DIAGNOSIS — F250 Schizoaffective disorder, bipolar type: Secondary | ICD-10-CM

## 2021-08-03 NOTE — Progress Notes (Signed)
In as scheduled for his every two week lab draw for his CBC with diff related to him taking Clozaril. Blood obtained from R Arm and lab notified to pick up specimen. He is to return on Tues for his medicine depending on the lab results. His next appt for lab draw is 12/16. He is pleasant and cooperative

## 2021-08-04 LAB — CBC WITH DIFFERENTIAL
Basophils Absolute: 0 10*3/uL (ref 0.0–0.2)
Basos: 1 %
EOS (ABSOLUTE): 0.1 10*3/uL (ref 0.0–0.4)
Eos: 2 %
Hematocrit: 38.8 % (ref 37.5–51.0)
Hemoglobin: 12.7 g/dL — ABNORMAL LOW (ref 13.0–17.7)
Immature Grans (Abs): 0 10*3/uL (ref 0.0–0.1)
Immature Granulocytes: 0 %
Lymphocytes Absolute: 1 10*3/uL (ref 0.7–3.1)
Lymphs: 20 %
MCH: 28.6 pg (ref 26.6–33.0)
MCHC: 32.7 g/dL (ref 31.5–35.7)
MCV: 87 fL (ref 79–97)
Monocytes Absolute: 0.3 10*3/uL (ref 0.1–0.9)
Monocytes: 7 %
Neutrophils Absolute: 3.6 10*3/uL (ref 1.4–7.0)
Neutrophils: 70 %
RBC: 4.44 x10E6/uL (ref 4.14–5.80)
RDW: 13.2 % (ref 11.6–15.4)
WBC: 5.1 10*3/uL (ref 3.4–10.8)

## 2021-08-10 ENCOUNTER — Ambulatory Visit (HOSPITAL_COMMUNITY): Payer: Self-pay

## 2021-08-15 ENCOUNTER — Other Ambulatory Visit: Payer: Self-pay

## 2021-08-15 ENCOUNTER — Ambulatory Visit (INDEPENDENT_AMBULATORY_CARE_PROVIDER_SITE_OTHER): Payer: No Payment, Other | Admitting: Physician Assistant

## 2021-08-15 ENCOUNTER — Encounter (HOSPITAL_COMMUNITY): Payer: Self-pay | Admitting: Physician Assistant

## 2021-08-15 VITALS — BP 119/75 | HR 83 | Ht 72.0 in | Wt 176.0 lb

## 2021-08-15 DIAGNOSIS — F251 Schizoaffective disorder, depressive type: Secondary | ICD-10-CM

## 2021-08-15 DIAGNOSIS — F411 Generalized anxiety disorder: Secondary | ICD-10-CM

## 2021-08-15 DIAGNOSIS — F332 Major depressive disorder, recurrent severe without psychotic features: Secondary | ICD-10-CM

## 2021-08-15 MED ORDER — BUPROPION HCL ER (SR) 150 MG PO TB12
ORAL_TABLET | ORAL | 0 refills | Status: DC
  Filled 2021-08-15: qty 3, 3d supply, fill #0

## 2021-08-15 MED ORDER — VENLAFAXINE HCL ER 75 MG PO CP24
75.0000 mg | ORAL_CAPSULE | Freq: Every day | ORAL | 1 refills | Status: DC
  Filled 2021-08-15: qty 30, 30d supply, fill #0
  Filled 2021-09-14: qty 30, 30d supply, fill #1
  Filled 2021-09-14: qty 30, 30d supply, fill #0

## 2021-08-15 NOTE — Progress Notes (Addendum)
BH MD/PA/NP OP Progress Note  08/15/2021 10:08 PM Larnce Schnackenberg  MRN:  [id number]  Chief Complaint:  Chief Complaint   Medication Management    HPI:   Sean Shepherd is a 45 year old male with a past psychiatric history significant for schizoaffective disorder (bipolar type), generalized anxiety disorder, major depressive disorder, and insomnia who presents to [facility] for follow-up and medication management.  Patient is currently being managed on the following medications:  Clozaril 200 mg at bedtime Melatonin 10 mg at bedtime Venlafaxine XR (Effexor XR to 37.5 mg 24-hour capsule daily  Patient reports that his anxiety is still present but has been more manageable.  He rates his anxiety as 7 out of 10 with no new stressors at this time.  He reports that he would like to discontinue taking Wellbutrin stating that he last took Wellbutrin today.  Patient states that he still deals with depression but reports that it has been manageable as well.  Patient's major stressor involves moving back into his old house stating that it makes him nervous being out on his own again.  Patient notes that he does have support groups to reach out to in case he feels vulnerable.    Patient endorses the following depressive symptoms: decreased energy, lack of motivation, and feelings of guilt/worthlessness.  Patient states that he also experiences flashbacks related to the time he hurt himself.  To keep himself busy, patient states that he has been attending [employer] Mondays, Wednesdays, and Fridays as well as helping his father around the house on Tuesdays and Thursdays.  A PHQ-9 screen was performed with the patient scoring a 12.  A GAD-7 screen was also performed with the patient scoring a 15.  Patient is alert and oriented x4, calm, cooperative, and fully engaged in conversation during the encounter.  Patient reports that he is still anxious about the future  and occasionally feels remorseful about the past.  Patient denies suicidal or homicidal ideations.  He further denies auditory or visual hallucinations and does not appear to be responding to internal/external stimuli.  Patient endorses good sleep and receives on average 8 hours of sleep each night.  Patient endorses good appetite and eats on average 3 meals per day.  Patient denies alcohol consumption, tobacco use, and illicit drug use.  Visit Diagnosis:    ICD-10-CM   1. Severe episode of recurrent major depressive disorder, without psychotic features (HCC)  F33.2 buPROPion (WELLBUTRIN SR) 150 MG 12 hr tablet    venlafaxine XR (EFFEXOR XR) 75 MG 24 hr capsule    2. Generalized anxiety disorder  F41.1 venlafaxine XR (EFFEXOR XR) 75 MG 24 hr capsule      Past Psychiatric History:  Schizoaffective disorder (bipolar type) Major depressive disorder Insomnia Generalized anxiety disorder  Past Medical History:  Past Medical History:  Diagnosis Date   Anxiety disorder, unspecified 01/10/2021   Bipolar disorder (HCC)    Depression    Schizoaffective disorder (HCC)     Past Surgical History:  Procedure Laterality Date   LAPAROTOMY N/A 12/25/2020   Procedure: EXPLORATORY LAPAROTOMY; REPAIR OF GROIN LACERATIONS X2, RIGHT THIGH LACERATION X 2; REPAIR RIGHT NECK AND LEFT NECK LACERATION AND REPAIR OF LEFT WRIST LACERATION;  Surgeon: [name], MD;  Location: [facility] OR;  Service: General;  Laterality: N/A;   NO PAST SURGERIES      Family Psychiatric History:  Father - Depression, currently taking Zoloft Sister - unsure, hx of trouble with the law, unsure  of med taken  Family History: History reviewed. No pertinent family history.  Social History:  Social History   Socioeconomic History   Marital status: Single    Spouse name: Not on file   Number of children: 0   Years of education: Not on file   Highest education level: Not on file  Occupational History   Not on file  Tobacco Use    Smoking status: Former    Types: Cigarettes    Quit date: 07/31/2014    Years since quitting: 7.0   Smokeless tobacco: Never  Vaping Use   Vaping Use: Every day   Substances: THC, CBD  Substance and Sexual Activity   Alcohol use: Not Currently   Drug use: Yes    Types: Marijuana   Sexual activity: Never  Other Topics Concern   Not on file  Social History Narrative   Not on file   Social Determinants of Health   Financial Resource Strain: Not on file  Food Insecurity: Not on file  Transportation Needs: Not on file  Physical Activity: Not on file  Stress: Not on file  Social Connections: Not on file    Allergies: No Known Allergies  Metabolic Disorder Labs: Lab Results  Component Value Date   HGBA1C 5.2 01/09/2021   MPG 102.54 01/09/2021   MPG 117 06/28/2020   No results found for: PROLACTIN Lab Results  Component Value Date   CHOL 188 11/18/2020   TRIG 124 11/18/2020   HDL 69 11/18/2020   CHOLHDL 2.7 11/18/2020   VLDL 25 11/18/2020   LDLCALC 94 11/18/2020   LDLCALC 128 (H) 06/28/2020   Lab Results  Component Value Date   TSH 0.828 01/25/2021   TSH 0.817 11/18/2020    Therapeutic Level Labs: No results found for: LITHIUM No results found for: VALPROATE No components found for:  CBMZ  Current Medications: Current Outpatient Medications  Medication Sig Dispense Refill   buPROPion (WELLBUTRIN SR) 150 MG 12 hr tablet Patient to take 1 tablet (150 mg total) every 3 days before discontinuing. 3 tablet 0   clozapine (CLOZARIL) 100 MG tablet Take 2 tablets (200 mg total) by mouth at bedtime. 28 tablet 3   Melatonin 10 MG CAPS Take 10 mg by mouth at bedtime. 30 capsule 1   venlafaxine XR (EFFEXOR XR) 75 MG 24 hr capsule Take 1 capsule (75 mg total) by mouth daily. 30 capsule 1   No current facility-administered medications for this visit.     Musculoskeletal: Strength & Muscle Tone: within normal limits Gait & Station: normal Patient leans:  N/A  Psychiatric Specialty Exam: Review of Systems  Psychiatric/Behavioral:  Negative for decreased concentration, dysphoric mood, hallucinations, self-injury, sleep disturbance and suicidal ideas. The patient is nervous/anxious. The patient is not hyperactive.    Blood pressure 119/75, pulse 83, height 6' (1.829 m), weight 176 lb (79.8 kg), SpO2 95 %.Body mass index is 23.87 kg/m.  General Appearance: Well Groomed  Eye Contact:  Good  Speech:  Clear and Coherent and Normal Rate  Volume:  Normal  Mood:  Anxious and Euthymic  Affect:  Appropriate and Congruent  Thought Process:  Coherent, Goal Directed, and Descriptions of Associations: Intact  Orientation:  Full (Time, Place, and Person)  Thought Content: WDL   Suicidal Thoughts:  No  Homicidal Thoughts:  No  Memory:  Immediate;   Good Recent;   Good Remote;   Good  Judgement:  Good  Insight:  Good  Psychomotor Activity:  Normal  Concentration:  Concentration: Good and Attention Span: Good  Recall:  Good  Fund of Knowledge: Good  Language: Good  Akathisia:  NA  Handed:  Right  AIMS (if indicated): not done  Assets:  Communication Skills Desire for Improvement Housing Social Support  ADL's:  Intact  Cognition: WNL  Sleep:  Good   Screenings: AIMS    Flowsheet Row [facility]  AIMS Total Score 0      CAGE-AID    Flowsheet Row ED to Hosp-Admission (Discharged) from 12/25/2020 in [facility]  SURGICAL  CAGE-AID Score 0      GAD-7    Flowsheet Row Clinical Support from 08/15/2021 in [facility] Office Visit from 07/13/2021 in [facility] Office Visit from 05/14/2021 in [facility] Office Visit from 04/27/2021 in [facility] Clinical Support from 03/28/2021 in [facility]  Total  GAD-7 Score 15 17 18 18 18       PHQ2-9    Flowsheet Row Clinical Support from 08/15/2021 in [facility] Office Visit from 07/13/2021 in [facility] Office Visit from 05/14/2021 in [facility] Office Visit from 04/27/2021 in [facility] Clinical Support from 03/28/2021 in [facility]  PHQ-2 Total Score 4 6 4 6 6   PHQ-9 Total Score 12 17 16 18 17       Flowsheet Row Clinical Support from 08/15/2021 in [facility] Office Visit from 07/13/2021 in [facility] Office Visit from 05/14/2021 in [facility]  C-SSRS RISK CATEGORY Moderate Risk Moderate Risk Moderate Risk        Assessment and Plan:   Ermal Haberer is a 45 year old male with a past psychiatric history significant for schizoaffective disorder (bipolar type), generalized anxiety disorder, major depressive disorder, and insomnia who presents to [facility] for follow-up and medication management.  Patient continues to struggle with anxiety and depression but states that his symptoms have been much more manageable since being placed on venlafaxine.  Patient would like to discontinue Wellbutrin.  Provider recommended the patient increase his venlafaxine dosage from 37.5mg  to 75 mg daily for the management of his depressive symptoms and anxiety.  Patient was agreeable to recommendation.  Patient was provided a sample of Wellbutrin in order to discontinue effectively.  Patient to take Wellbutrin every other day for 6 days followed by every 2 days for 9 days before discontinuing.  Patient vocalized understanding.  Patient's medications to be e-prescribed to pharmacy of choice.  1. Severe episode of recurrent major depressive disorder, without psychotic features (HCC)  -  buPROPion (WELLBUTRIN SR) 150 MG 12 hr tablet; Patient to take 1 tablet (150 mg total) every 3 days before discontinuing.  Dispense: 3 tablet; Refill: 0 - venlafaxine XR (EFFEXOR XR) 75 MG 24 hr capsule; Take 1 capsule (75 mg total) by mouth daily.  Dispense: 30 capsule; Refill: 1  2. Generalized anxiety disorder  - venlafaxine XR (EFFEXOR XR) 75 MG 24 hr capsule; Take 1 capsule (75 mg total) by mouth daily.  Dispense: 30 capsule; Refill: 1  3. Schizoaffective disorder, depressive type (HCC) Patient to continue taking olanzapine 200 mg at bedtime for the management of his schizoaffective disorder (bipolar type)  Patient to follow up in  7 weeks Provider spent a total of 90 minutes with the patient/reviewing patient's chart  Meta Hatchet, PA 08/15/2021, 10:08 PM

## 2021-08-16 ENCOUNTER — Other Ambulatory Visit: Payer: Self-pay

## 2021-08-16 DIAGNOSIS — F251 Schizoaffective disorder, depressive type: Secondary | ICD-10-CM | POA: Insufficient documentation

## 2021-08-16 NOTE — Telephone Encounter (Signed)
Message acknowledged and reviewed. Patient is currently being managed on Venlafaxine for the management of his anxiety.

## 2021-08-17 ENCOUNTER — Other Ambulatory Visit: Payer: Self-pay

## 2021-08-17 ENCOUNTER — Telehealth (HOSPITAL_COMMUNITY): Payer: Self-pay | Admitting: *Deleted

## 2021-08-17 ENCOUNTER — Ambulatory Visit (HOSPITAL_COMMUNITY): Payer: No Payment, Other | Admitting: *Deleted

## 2021-08-17 DIAGNOSIS — Z79899 Other long term (current) drug therapy: Secondary | ICD-10-CM

## 2021-08-17 DIAGNOSIS — F251 Schizoaffective disorder, depressive type: Secondary | ICD-10-CM

## 2021-08-17 NOTE — Telephone Encounter (Signed)
In for his every two week lab draw for his CBC with Diff related to his ongoing use of Clozaril. BLood obtained from R arm without difficulty. Ref number from [facility] [id number]. Notified [facility] for needed pick up.

## 2021-08-22 ENCOUNTER — Ambulatory Visit (HOSPITAL_COMMUNITY): Payer: Self-pay

## 2021-08-31 ENCOUNTER — Other Ambulatory Visit (HOSPITAL_COMMUNITY): Payer: Self-pay | Admitting: Physician Assistant

## 2021-08-31 ENCOUNTER — Ambulatory Visit (HOSPITAL_COMMUNITY): Payer: No Payment, Other | Admitting: *Deleted

## 2021-08-31 DIAGNOSIS — F251 Schizoaffective disorder, depressive type: Secondary | ICD-10-CM

## 2021-08-31 NOTE — Progress Notes (Signed)
PATIENT ARRIVED FOR BI-WEEKLY LAB DRAWS. UNUSUAL DIFFICULT LAB DRAW THIS WEEK ON RIGHT-ARM. PATIENT WASN'T COMFORTABLE GOING TO [facility] FOR LAB WORK & REQUESTED TO HAVE 1 MORE  ATTEMPT MADE ON THE LEFT ARM. REQUESTED GRANTED  & LAB DRAW SUCCESSFUL & COMPLETE.

## 2021-09-04 LAB — CBC WITH DIFFERENTIAL
Basophils Absolute: 0 10*3/uL (ref 0.0–0.2)
Basos: 0 %
EOS (ABSOLUTE): 0.1 10*3/uL (ref 0.0–0.4)
Eos: 1 %
Hematocrit: 37.4 % — ABNORMAL LOW (ref 37.5–51.0)
Hemoglobin: 12.7 g/dL — ABNORMAL LOW (ref 13.0–17.7)
Immature Grans (Abs): 0 10*3/uL (ref 0.0–0.1)
Immature Granulocytes: 0 %
Lymphocytes Absolute: 0.8 10*3/uL (ref 0.7–3.1)
Lymphs: 16 %
MCH: 29.1 pg (ref 26.6–33.0)
MCHC: 34 g/dL (ref 31.5–35.7)
MCV: 86 fL (ref 79–97)
Monocytes Absolute: 0.3 10*3/uL (ref 0.1–0.9)
Monocytes: 6 %
Neutrophils Absolute: 3.8 10*3/uL (ref 1.4–7.0)
Neutrophils: 77 %
RBC: 4.36 x10E6/uL (ref 4.14–5.80)
RDW: 13 % (ref 11.6–15.4)
WBC: 5 10*3/uL (ref 3.4–10.8)

## 2021-09-07 ENCOUNTER — Ambulatory Visit (HOSPITAL_COMMUNITY): Payer: No Payment, Other

## 2021-09-11 ENCOUNTER — Telehealth (HOSPITAL_COMMUNITY): Payer: Self-pay | Admitting: *Deleted

## 2021-09-11 NOTE — Telephone Encounter (Signed)
In to pick up his clozaril as he does twice a month based on his lab results. In conversation today he reported he continues to be very nervous and the Effoxer has only helped a little bit with his anxiety. Will forward concern to [place] PA his provider. He has a future appt on 10/03/21.

## 2021-09-14 ENCOUNTER — Other Ambulatory Visit: Payer: Self-pay

## 2021-09-14 ENCOUNTER — Ambulatory Visit (HOSPITAL_COMMUNITY): Payer: No Payment, Other | Admitting: *Deleted

## 2021-09-14 DIAGNOSIS — Z79899 Other long term (current) drug therapy: Secondary | ICD-10-CM

## 2021-09-14 NOTE — Progress Notes (Signed)
In as scheduled for his bi monthly lab draw for his CBC w Diff. Able to draw speciman from his R inner arm without difficulty. [place] sample number is [id number]. Sent to [facility] to be processed.

## 2021-09-15 LAB — CBC WITH DIFFERENTIAL
Basophils Absolute: 0 10*3/uL (ref 0.0–0.2)
Basos: 0 %
EOS (ABSOLUTE): 0 10*3/uL (ref 0.0–0.4)
Eos: 1 %
Hematocrit: 38.8 % (ref 37.5–51.0)
Hemoglobin: 12.9 g/dL — ABNORMAL LOW (ref 13.0–17.7)
Immature Grans (Abs): 0 10*3/uL (ref 0.0–0.1)
Immature Granulocytes: 0 %
Lymphocytes Absolute: 0.8 10*3/uL (ref 0.7–3.1)
Lymphs: 17 %
MCH: 28.4 pg (ref 26.6–33.0)
MCHC: 33.2 g/dL (ref 31.5–35.7)
MCV: 86 fL (ref 79–97)
Monocytes Absolute: 0.3 10*3/uL (ref 0.1–0.9)
Monocytes: 6 %
Neutrophils Absolute: 3.4 10*3/uL (ref 1.4–7.0)
Neutrophils: 76 %
RBC: 4.54 x10E6/uL (ref 4.14–5.80)
RDW: 12.8 % (ref 11.6–15.4)
WBC: 4.5 10*3/uL (ref 3.4–10.8)

## 2021-09-17 NOTE — Telephone Encounter (Signed)
Provider was contacted by Orpah Clinton. Reola Calkins, RN regarding patient's anxiety. Provider attempted to call patient but no answer. Provider left message informing patient to call facility back.  Plan for patient: provider to increase patient's Effexor for the management of his anxiety.

## 2021-09-18 ENCOUNTER — Other Ambulatory Visit (HOSPITAL_COMMUNITY): Payer: Self-pay | Admitting: Physician Assistant

## 2021-09-18 DIAGNOSIS — F250 Schizoaffective disorder, bipolar type: Secondary | ICD-10-CM

## 2021-09-18 MED ORDER — CLOZAPINE 100 MG PO TABS: 200.0000 mg | ORAL_TABLET | Freq: Every day | ORAL | 3 refills | Status: DC

## 2021-09-18 NOTE — Progress Notes (Signed)
Patient has ran out of refills for his Clozaril. Provider to e-prescribe prescription to pharmacy of choice.

## 2021-09-21 ENCOUNTER — Ambulatory Visit (HOSPITAL_COMMUNITY): Payer: Self-pay

## 2021-09-26 ENCOUNTER — Telehealth (HOSPITAL_COMMUNITY): Payer: Self-pay | Admitting: *Deleted

## 2021-09-26 NOTE — Telephone Encounter (Signed)
Called Sean Shepherd to change his time on fri for his lab draw to 930 and to inform him he also was going to be seen by Dr Lucianne Muss tomorrow am for a medication assessment as he continues to voice a high level of anxiety and he has a high risk past for self harm.

## 2021-09-28 ENCOUNTER — Encounter (HOSPITAL_COMMUNITY): Payer: Self-pay

## 2021-09-28 ENCOUNTER — Telehealth (HOSPITAL_COMMUNITY): Payer: Self-pay | Admitting: *Deleted

## 2021-09-28 ENCOUNTER — Ambulatory Visit (HOSPITAL_COMMUNITY): Payer: No Payment, Other | Admitting: *Deleted

## 2021-09-28 ENCOUNTER — Ambulatory Visit (INDEPENDENT_AMBULATORY_CARE_PROVIDER_SITE_OTHER): Payer: No Payment, Other | Admitting: Psychiatry

## 2021-09-28 VITALS — BP 128/75 | HR 94 | Ht 73.0 in | Wt 173.0 lb

## 2021-09-28 DIAGNOSIS — F411 Generalized anxiety disorder: Secondary | ICD-10-CM | POA: Diagnosis not present

## 2021-09-28 DIAGNOSIS — F250 Schizoaffective disorder, bipolar type: Secondary | ICD-10-CM

## 2021-09-28 DIAGNOSIS — Z79899 Other long term (current) drug therapy: Secondary | ICD-10-CM

## 2021-09-28 MED ORDER — CLOZAPINE 25 MG PO TABS
25.0000 mg | ORAL_TABLET | Freq: Every day | ORAL | 11 refills | Status: DC
Start: 2021-09-28 — End: 2021-11-01

## 2021-09-28 NOTE — Telephone Encounter (Signed)
Dr Lucianne Muss increased his Clozaril dose by adding 25 mg in the am to his 200 mg he has been taking at [facility]. He gets his clozaril thru patient assistance and they need a new rx refecting the 25 mg addition to ship out to [place] for him to pick up. I called [facility] they can have it here by Monday. They will call [facility] where Dr Lucianne Muss sent it to to transfer it to them at [facility]. He chose to not pick up the 25 mg today from [place] and wait for the patient assistance to rx was transferred. He was notified to come Monday to pick up his meds but to call first to make sure it has arrived.

## 2021-09-28 NOTE — Progress Notes (Signed)
Established Patient Office Visit  Subjective:  Patient ID: Sean Shepherd, male    DOB: 01-03-1976  Age: 46 y.o. MRN: [id number]  CC:  Chief Complaint  Patient presents with   Anxiety   Medication Problem   Follow-up    HPI patient is a 46 year old male, diagnosed with schizo affective disorder who presents today for a follow-up visit.  Patient reports that he feels anxious, has different thoughts in his head, and does not feel any of the antidepressants have helped with his anxiety  Patient denies any thoughts of self-harm, harm to others.  He does report that he has auditory hallucinations sometimes, is able to ignore them and that they are not command in nature.  He also reports he sleeping well at night with the clozapine, states that the Effexor has increased his anxiety.  Patient states that overall he is doing fairly well.  Plans to volunteer at [employer] this afternoon, is trying to socialize.  He adds that he is here regularly to have his lab drawn, takes his medications as prescribed.  On a scale of 0-10, with 0 being no symptoms and 10 being the worst, patient reports his anxiety a 5 out of 10.  He states that his anxiety is during the day, with him having multiple thoughts, denies any OCD symptoms.  He also denies any side effects with his medications  Psychiatric Specialty Exam  Presentation  General Appearance: Appropriate for Environment  Eye Contact:Good  Speech:Normal Rate  Speech Volume:Decreased  Handedness:Right   Mood and Affect  Mood:Anxious    Affect:Appropriate   Thought Process  Thought Processes:Goal Directed  Descriptions of Associations:Intact  Orientation:Full (Time, Place and Person)  Thought Content:Logical  History of Schizophrenia/Schizoaffective disorder:Yes  Duration of Psychotic Symptoms:Greater than six months  Hallucinations:No data recorded Ideas of Reference:None  Suicidal Thoughts:No data recorded Homicidal  Thoughts:No data recorded  Sensorium  Memory:Immediate Good; Recent Good; Remote Good  Judgment:Fair  Insight:Fair   Executive Functions  Concentration:Good  Attention Span:Good  Recall:Good  Fund of Knowledge:Good  Language:Good   Psychomotor Activity  Psychomotor Activity:normal  Assets  Assets:Desire for Improvement; Housing; Resilience; Social Support   Sleep  Sleep:8 hours   Physical Exam: Physical Exam Review of Systems  Constitutional: Negative.  Negative for activity change, appetite change and fatigue.  HENT: Negative.  Negative for congestion.   Eyes: Negative.   Cardiovascular: Negative.   Musculoskeletal: Negative.   Neurological: Negative.  Negative for dizziness, tremors, seizures, weakness and numbness.  Psychiatric/Behavioral:  Positive for hallucinations. Negative for agitation, behavioral problems, confusion, decreased concentration, dysphoric mood, self-injury, sleep disturbance and suicidal ideas. The patient is nervous/anxious. The patient is not hyperactive.   There were no vitals taken for this visit. There is no height or weight on file to calculate BMI.  Past Medical History:  Diagnosis Date   Anxiety disorder, unspecified 01/10/2021   Bipolar disorder (HCC)    Depression    Schizoaffective disorder (HCC)     Past Surgical History:  Procedure Laterality Date   LAPAROTOMY N/A 12/25/2020   Procedure: EXPLORATORY LAPAROTOMY; REPAIR OF GROIN LACERATIONS X2, RIGHT THIGH LACERATION X 2; REPAIR RIGHT NECK AND LEFT NECK LACERATION AND REPAIR OF LEFT WRIST LACERATION;  Surgeon: [name], MD;  Location: [facility] OR;  Service: General;  Laterality: N/A;   NO PAST SURGERIES      No family history on file.  Social History   Socioeconomic History   Marital status: Single    Spouse name:  Not on file   Number of children: 0   Years of education: Not on file   Highest education level: Not on file  Occupational History   Not on file   Tobacco Use   Smoking status: Former    Types: Cigarettes    Quit date: 07/31/2014    Years since quitting: 7.1   Smokeless tobacco: Never  Vaping Use   Vaping Use: Every day   Substances: THC, CBD  Substance and Sexual Activity   Alcohol use: Not Currently   Drug use: Yes    Types: Marijuana   Sexual activity: Never  Other Topics Concern   Not on file  Social History Narrative   Not on file   Social Determinants of Health   Financial Resource Strain: Not on file  Food Insecurity: Not on file  Transportation Needs: Not on file  Physical Activity: Not on file  Stress: Not on file  Social Connections: Not on file  Intimate Partner Violence: Not on file    Outpatient Medications Prior to Visit  Medication Sig Dispense Refill   cloZAPine (CLOZARIL) 100 MG tablet Take 2 tablets (200 mg total) by mouth at bedtime. 28 tablet 3   Melatonin 10 MG CAPS Take 10 mg by mouth at bedtime. 30 capsule 1   No facility-administered medications prior to visit.    No Known Allergies  ROS Review of Systems  Constitutional: Negative.  Negative for activity change, appetite change and fatigue.  HENT: Negative.  Negative for congestion.   Eyes: Negative.   Cardiovascular: Negative.   Musculoskeletal: Negative.   Neurological: Negative.  Negative for dizziness, tremors, seizures, weakness and numbness.  Psychiatric/Behavioral:  Positive for hallucinations. Negative for agitation, behavioral problems, confusion, decreased concentration, dysphoric mood, self-injury, sleep disturbance and suicidal ideas. The patient is nervous/anxious. The patient is not hyperactive.      Objective:        There were no vitals taken for this visit. Wt Readings from Last 3 Encounters:  09/28/21 173 lb (78.5 kg)  08/15/21 176 lb (79.8 kg)  05/14/21 164 lb (74.4 kg)     Health Maintenance Due  Topic Date Due   COVID-19 Vaccine (1) Never done   Hepatitis C Screening  Never done   COLONOSCOPY (Pts  45-78yrs Insurance coverage will need to be confirmed)  Never done    There are no preventive care reminders to display for this patient.  Lab Results  Component Value Date   TSH 0.828 01/25/2021   Lab Results  Component Value Date   WBC 4.5 09/14/2021   HGB 12.9 (L) 09/14/2021   HCT 38.8 09/14/2021   MCV 86 09/14/2021   PLT 251 07/25/2021   Lab Results  Component Value Date   NA 138 01/26/2021   K 3.7 01/26/2021   CO2 27 01/26/2021   GLUCOSE 127 (H) 01/26/2021   BUN 18 01/26/2021   CREATININE 0.72 01/26/2021   BILITOT 0.6 01/26/2021   ALKPHOS 70 01/26/2021   AST 13 (L) 01/26/2021   ALT 18 01/26/2021   PROT 7.0 01/26/2021   ALBUMIN 3.8 01/26/2021   CALCIUM 9.4 01/26/2021   ANIONGAP 9 01/26/2021   Lab Results  Component Value Date   CHOL 188 11/18/2020   Lab Results  Component Value Date   HDL 69 11/18/2020   Lab Results  Component Value Date   LDLCALC 94 11/18/2020   Lab Results  Component Value Date   TRIG 124 11/18/2020   Lab Results  Component Value Date   CHOLHDL 2.7 11/18/2020   Lab Results  Component Value Date   HGBA1C 5.2 01/09/2021      Assessment & Plan:   Schizoaffective disorder, bipolar type (HCC)  Generalized anxiety disorder    Discussed discontinuing Effexor.  Patient was agreeable with this plan.  To start clozapine 25 mg 1 in the morning, discussed increasing it further to 50 mg 1 in the morning in the next week.  Patient has an appointment on Wednesday for outpatient follow-up. To continue clozapine 200 mg at bedtime for schizoaffective disorder.  Patient reports that he is not having any side effects with it.  His labs are stable.  Patient reports he is sleeping fine on the medication, adds that the hallucinations are no longer command in nature, occasionally he hears voices but is able to ignore them.  To continue melatonin 10 mg at night if needed for sleep  Call as needed, to follow-up this coming  Wednesday      Nelly RoutArchana Zuriel Yeaman, MD

## 2021-09-28 NOTE — Progress Notes (Signed)
In today to have his bimonthly lab drawn for his clozaril. He also was seen today by Dr Dwyane Dee for a med check due to his ongoing anxiety. CBC w diff obtained from his R ARM without issue. He is to return tues for his meds as the drug company supplies them to [place] to give him dependent on his lab results.

## 2021-09-29 LAB — CBC WITH DIFFERENTIAL
Basophils Absolute: 0 10*3/uL (ref 0.0–0.2)
Basos: 1 %
EOS (ABSOLUTE): 0.1 10*3/uL (ref 0.0–0.4)
Eos: 2 %
Hematocrit: 38 % (ref 37.5–51.0)
Hemoglobin: 12.4 g/dL — ABNORMAL LOW (ref 13.0–17.7)
Immature Grans (Abs): 0 10*3/uL (ref 0.0–0.1)
Immature Granulocytes: 0 %
Lymphocytes Absolute: 0.7 10*3/uL (ref 0.7–3.1)
Lymphs: 17 %
MCH: 28 pg (ref 26.6–33.0)
MCHC: 32.6 g/dL (ref 31.5–35.7)
MCV: 86 fL (ref 79–97)
Monocytes Absolute: 0.3 10*3/uL (ref 0.1–0.9)
Monocytes: 7 %
Neutrophils Absolute: 3.3 10*3/uL (ref 1.4–7.0)
Neutrophils: 73 %
RBC: 4.43 x10E6/uL (ref 4.14–5.80)
RDW: 13.1 % (ref 11.6–15.4)
WBC: 4.4 10*3/uL (ref 3.4–10.8)

## 2021-10-03 ENCOUNTER — Encounter (HOSPITAL_COMMUNITY): Payer: Self-pay | Admitting: Physician Assistant

## 2021-10-03 ENCOUNTER — Other Ambulatory Visit: Payer: Self-pay

## 2021-10-03 ENCOUNTER — Ambulatory Visit (INDEPENDENT_AMBULATORY_CARE_PROVIDER_SITE_OTHER): Payer: No Payment, Other | Admitting: Physician Assistant

## 2021-10-03 DIAGNOSIS — F411 Generalized anxiety disorder: Secondary | ICD-10-CM | POA: Diagnosis not present

## 2021-10-03 DIAGNOSIS — F250 Schizoaffective disorder, bipolar type: Secondary | ICD-10-CM | POA: Diagnosis not present

## 2021-10-03 NOTE — Progress Notes (Signed)
BH MD/PA/NP OP Progress Note  Virtual Visit via Telephone Note  I connected with Doris Mcgilvery on 10/03/21 at 11:30 AM EST by telephone and verified that I am speaking with the correct person using two identifiers.  Location: Patient: Home Provider: Clinic   I discussed the limitations, risks, security and privacy concerns of performing an evaluation and management service by telephone and the availability of in person appointments. I also discussed with the patient that there may be a patient responsible charge related to this service. The patient expressed understanding and agreed to proceed.  Follow Up Instructions:  I discussed the assessment and treatment plan with the patient. The patient was provided an opportunity to ask questions and all were answered. The patient agreed with the plan and demonstrated an understanding of the instructions.   The patient was advised to call back or seek an in-person evaluation if the symptoms worsen or if the condition fails to improve as anticipated.  I provided 17 minutes of non-face-to-face time during this encounter.  Meta Hatchet, PA    10/03/2021 6:17 PM Paige Vanderwoude  MRN:  [id number]  Chief Complaint:   Follow-up and medication management  HPI:   Sean Shepherd "Sean Shepherd" is a 46 year old male with a past psychiatric history significant for schizoaffective disorder (bipolar type) and generalized anxiety disorder who presented to [name] via virtual telephone visit for follow-up of medication management.  Patient is currently being managed on the following medication:  Clozaril 200 mg at bedtime Clozaril 25 mg daily  Patient reports that he was recently seen by Dr. Lucianne Muss Nelly Rout, MD) previously and was instructed to discontinue his use of Effexor XR while adding on a daily dose of Clozaril 25 mg daily for the management of his symptoms.  Patient reports that he will pick up his  Clozaril 25 mg daily prescription tomorrow.  Patient continues to endorse depression but states that his anxiety continues to be his main concern.  In addition to his anxiety patient states that he has noticed lack of energy.  He attributes his elevated anxiety to moving back into his house but states that he continues to visit his family and attend [facility] 3 times a day.  Patient states that he has also started volunteering at [employer].  A PHQ-9 screen was performed with the patient scoring a 10.  A GAD-7 screen was also performed with the patient scoring a 13.  Patient is alert and oriented x4, calm, cooperative, and fully engaged in conversation during the encounter.  Patient states that he is anxious about the future and is regretful about the past.  Patient denies suicidal or homicidal ideations.  He denies active auditory or visual hallucinations but states that he occasionally experiences auditory hallucinations.  Patient does not appear to be responding to internal/external stimuli.  Patient endorses fair sleep and receives on average 8 hours of sleep each night.  He reports that his sleep has been a little disjointed.  Patient endorses good appetite and eats on average 3 meals per day.  Patient denies alcohol consumption, tobacco use, and illicit drug use.  Visit Diagnosis:    ICD-10-CM   1. Schizoaffective disorder, bipolar type (HCC)  F25.0     2. Generalized anxiety disorder  F41.1       Past Psychiatric History:  Schizoaffective disorder (bipolar type) Major depressive disorder Insomnia Generalized anxiety disorder  Past Medical History:  Past Medical History:  Diagnosis Date  Anxiety disorder, unspecified 01/10/2021   Bipolar disorder (HCC)    Depression    Schizoaffective disorder (HCC)     Past Surgical History:  Procedure Laterality Date   LAPAROTOMY N/A 12/25/2020   Procedure: EXPLORATORY LAPAROTOMY; REPAIR OF GROIN LACERATIONS X2, RIGHT THIGH LACERATION X 2;  REPAIR RIGHT NECK AND LEFT NECK LACERATION AND REPAIR OF LEFT WRIST LACERATION;  Surgeon: [name], MD;  Location: [facility] OR;  Service: General;  Laterality: N/A;   NO PAST SURGERIES      Family Psychiatric History:  Father - Depression, currently taking Zoloft Sister - unsure, hx of trouble with the law, unsure of med taken  Family History: History reviewed. No pertinent family history.  Social History:  Social History   Socioeconomic History   Marital status: Single    Spouse name: Not on file   Number of children: 0   Years of education: Not on file   Highest education level: Not on file  Occupational History   Not on file  Tobacco Use   Smoking status: Former    Types: Cigarettes    Quit date: 07/31/2014    Years since quitting: 7.1   Smokeless tobacco: Never  Vaping Use   Vaping Use: Every day   Substances: THC, CBD  Substance and Sexual Activity   Alcohol use: Not Currently   Drug use: Yes    Types: Marijuana   Sexual activity: Never  Other Topics Concern   Not on file  Social History Narrative   Not on file   Social Determinants of Health   Financial Resource Strain: Not on file  Food Insecurity: Not on file  Transportation Needs: Not on file  Physical Activity: Not on file  Stress: Not on file  Social Connections: Not on file    Allergies: No Known Allergies  Metabolic Disorder Labs: Lab Results  Component Value Date   HGBA1C 5.2 01/09/2021   MPG 102.54 01/09/2021   MPG 117 06/28/2020   No results found for: PROLACTIN Lab Results  Component Value Date   CHOL 188 11/18/2020   TRIG 124 11/18/2020   HDL 69 11/18/2020   CHOLHDL 2.7 11/18/2020   VLDL 25 11/18/2020   LDLCALC 94 11/18/2020   LDLCALC 128 (H) 06/28/2020   Lab Results  Component Value Date   TSH 0.828 01/25/2021   TSH 0.817 11/18/2020    Therapeutic Level Labs: No results found for: LITHIUM No results found for: VALPROATE No components found for:  CBMZ  Current  Medications: Current Outpatient Medications  Medication Sig Dispense Refill   cloZAPine (CLOZARIL) 100 MG tablet Take 2 tablets (200 mg total) by mouth at bedtime. 28 tablet 3   cloZAPine (CLOZARIL) 25 MG tablet Take 1 tablet (25 mg total) by mouth daily. 30 tablet 11   Melatonin 10 MG CAPS Take 10 mg by mouth at bedtime. 30 capsule 1   No current facility-administered medications for this visit.     Musculoskeletal: Strength & Muscle Tone: Unable to assess due to telemedicine visit Gait & Station: Unable to assess due to telemedicine visit Patient leans: Unable to assess due to telemedicine visit  Psychiatric Specialty Exam: Review of Systems  Psychiatric/Behavioral:  Positive for sleep disturbance. Negative for decreased concentration, dysphoric mood, hallucinations, self-injury and suicidal ideas. The patient is nervous/anxious. The patient is not hyperactive.    There were no vitals taken for this visit.There is no height or weight on file to calculate BMI.  General Appearance: Unable to assess due  to telemedicine visit  Eye Contact:  Unable to assess due to telemedicine visit  Speech:  Clear and Coherent and Normal Rate  Volume:  Normal  Mood:  Anxious and Depressed  Affect:  Congruent  Thought Process:  Coherent and Descriptions of Associations: Intact  Orientation:  Full (Time, Place, and Person)  Thought Content: WDL   Suicidal Thoughts:  No  Homicidal Thoughts:  No  Memory:  Immediate;   Good Recent;   Good Remote;   Good  Judgement:  Good  Insight:  Good  Psychomotor Activity:  Normal  Concentration:  Concentration: Good and Attention Span: Good  Recall:  Good  Fund of Knowledge: Good  Language: Good  Akathisia:  NA  Handed:  Right  AIMS (if indicated): not done  Assets:  Communication Skills Desire for Improvement Housing Social Support  ADL's:  Intact  Cognition: WNL  Sleep:  Fair   Screenings: AIMS    Flowsheet Row [facility]  AIMS Total Score 0      CAGE-AID    Flowsheet Row ED to Hosp-Ad[facility] (Dis[facility]GICAL  CAGE-AID Score 0      GAD-7    Flowsheet Row [facility]  Total GAD-7 Score 13 15 17 18 18       PHQ2-9    Flowsheet Row [facility]  PHQ-2 Total Score 4 4 6 4 6   PHQ-9 Total Score 10 12 17 16 18       Flowsheet Row [facility]20[facility]  C-SSRS RISK CATEGORY Moderate Risk Moderate Risk Moderate Risk        Assessment and Plan:   Harley HallmarkChanning Chrostowski "Sean DrillingChan" is a 46 year old male with a past psychiatric history significant for schizoaffective disorder (bipolar type) and generalized anxiety disorder who [facility] telephone visit for follow-up of medication management.  Patient continues to endorse elevated anxiety along with lack of energy.  Patient is no longer taking Effexor XR and will start taking Clozaril 25 mg daily,  followed by Clozaril 50 mg daily by next week for the management of his restlessness and symptoms of schizoaffective disorder.  Patient to be reevaluated on the following encounter.  1. Schizoaffective disorder, bipolar type [facility](HCC) Patient states he will start taking clozapine 25 mg daily, followed by increasing it further to 50 mg in the morning by next week. Patient to continue taking clozapine 200 mg at bedtime for the management of his schizoaffective disorder  2. Generalized anxiety disorder  Patient to follow up in 4 weeks Provider spent a total of 17 minutes with the patient/reviewing patient's chart  Meta HatchetUchenna E Nasiah Lehenbauer, PA 10/03/2021, 6:17 PM

## 2021-10-05 ENCOUNTER — Ambulatory Visit (HOSPITAL_COMMUNITY): Payer: Self-pay

## 2021-10-12 ENCOUNTER — Other Ambulatory Visit: Payer: Self-pay

## 2021-10-12 ENCOUNTER — Ambulatory Visit (HOSPITAL_COMMUNITY): Payer: No Payment, Other | Admitting: *Deleted

## 2021-10-12 ENCOUNTER — Other Ambulatory Visit (HOSPITAL_COMMUNITY): Payer: Self-pay | Admitting: Physician Assistant

## 2021-10-12 ENCOUNTER — Telehealth (HOSPITAL_COMMUNITY): Payer: Self-pay | Admitting: *Deleted

## 2021-10-12 DIAGNOSIS — Z79899 Other long term (current) drug therapy: Secondary | ICD-10-CM

## 2021-10-12 NOTE — Progress Notes (Signed)
Patient ID: Sean Shepherd, male   DOB: 12-Aug-1976, 46 y.o.   MRN: [id number] CBC w Diff drawn from patients R ARM without difficulty. He reports little noticeable change from the addition of 25mg  of clozaril in the am which he has been taking for a week. Discussed him increasing it to 50 mg as Dr directed him to tomorrow. He states he will but if it doesn't help he wants to return to the [facility]. He does have a future appt with [name] PA 11/01/21. Will forward the message to [place] PA to see if he can give him a sooner appt or call him with any needed adjustments and assess any benefit from the increase in his Clozaril. 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 He is to return Tues to pick up his Clozaril. He also brought another bill for his lab, which despite several calls to [facility] he continues to receive them. Confirmation code for lab today [id number].

## 2021-10-12 NOTE — Telephone Encounter (Signed)
Opened a second time in error. 

## 2021-10-15 LAB — CBC WITH DIFFERENTIAL
Basophils Absolute: 0 10*3/uL (ref 0.0–0.2)
Basos: 0 %
EOS (ABSOLUTE): 0.1 10*3/uL (ref 0.0–0.4)
Eos: 2 %
Hematocrit: 39.6 % (ref 37.5–51.0)
Hemoglobin: 12.9 g/dL — ABNORMAL LOW (ref 13.0–17.7)
Immature Grans (Abs): 0 10*3/uL (ref 0.0–0.1)
Immature Granulocytes: 0 %
Lymphocytes Absolute: 0.8 10*3/uL (ref 0.7–3.1)
Lymphs: 16 %
MCH: 28.7 pg (ref 26.6–33.0)
MCHC: 32.6 g/dL (ref 31.5–35.7)
MCV: 88 fL (ref 79–97)
Monocytes Absolute: 0.3 10*3/uL (ref 0.1–0.9)
Monocytes: 5 %
Neutrophils Absolute: 4.1 10*3/uL (ref 1.4–7.0)
Neutrophils: 77 %
RBC: 4.49 x10E6/uL (ref 4.14–5.80)
RDW: 13.8 % (ref 11.6–15.4)
WBC: 5.3 10*3/uL (ref 3.4–10.8)

## 2021-10-19 ENCOUNTER — Other Ambulatory Visit (HOSPITAL_COMMUNITY): Payer: Self-pay | Admitting: *Deleted

## 2021-10-19 ENCOUNTER — Other Ambulatory Visit (HOSPITAL_COMMUNITY): Payer: Self-pay | Admitting: Physician Assistant

## 2021-10-19 ENCOUNTER — Ambulatory Visit (HOSPITAL_COMMUNITY): Payer: Self-pay

## 2021-10-19 DIAGNOSIS — F250 Schizoaffective disorder, bipolar type: Secondary | ICD-10-CM

## 2021-10-19 MED ORDER — CLOZAPINE 100 MG PO TABS: 200.0000 mg | ORAL_TABLET | Freq: Every day | ORAL | 11 refills | Status: DC

## 2021-10-19 NOTE — Progress Notes (Signed)
Patient's medication was e-prescribed to pharmacy of choice.

## 2021-10-24 NOTE — Telephone Encounter (Signed)
Message acknowledged and reviewed.

## 2021-10-26 ENCOUNTER — Ambulatory Visit (HOSPITAL_COMMUNITY): Payer: No Payment, Other | Admitting: *Deleted

## 2021-10-26 ENCOUNTER — Telehealth (HOSPITAL_COMMUNITY): Payer: Self-pay | Admitting: *Deleted

## 2021-10-26 ENCOUNTER — Other Ambulatory Visit (HOSPITAL_COMMUNITY): Payer: Self-pay | Admitting: *Deleted

## 2021-10-26 DIAGNOSIS — Z79899 Other long term (current) drug therapy: Secondary | ICD-10-CM

## 2021-10-26 DIAGNOSIS — F250 Schizoaffective disorder, bipolar type: Secondary | ICD-10-CM

## 2021-10-26 NOTE — Telephone Encounter (Signed)
PATIENT ARRIVED FOR HIS BI-WEEKLY LAB WORK. PATIENT LOOKS GREAT & INFORMED THAT HE STARTED BACK TAKING HIS EFFEXOR IN THE AM &  CONTINUES TAKING THE cloZAPine (CLOZARIL) 100 MG tablet @ BEDTIME. WHEN ASKED SO HOW ARE FEELING ABOUT THAT & HE STATED HE FEELS GOOD BEGIN BACK ON THE EFFEXOR WITH THE CLOZARIL.

## 2021-10-26 NOTE — Progress Notes (Signed)
PATIENT ARRIVED FOR HIS BI-WEEKLY LAB WORK. PATIENT LOOKS GREAT & INFORMED THAT HE STARTED BACK TAKING HIS EFFEXOR IN THE AM &  CONTINUES TAKING THE cloZAPine (CLOZARIL) 100 MG tablet °@ BEDTIME. WHEN ASKED SO HOW ARE FEELING ABOUT THAT & HE STATED HE FEELS GOOD BEGIN BACK ON THE EFFEXOR WITH THE CLOZARIL. °

## 2021-10-27 LAB — CBC WITH DIFFERENTIAL
Basophils Absolute: 0 10*3/uL (ref 0.0–0.2)
Basos: 0 %
EOS (ABSOLUTE): 0.1 10*3/uL (ref 0.0–0.4)
Eos: 2 %
Hematocrit: 37.2 % — ABNORMAL LOW (ref 37.5–51.0)
Hemoglobin: 12.3 g/dL — ABNORMAL LOW (ref 13.0–17.7)
Immature Grans (Abs): 0 10*3/uL (ref 0.0–0.1)
Immature Granulocytes: 0 %
Lymphocytes Absolute: 0.8 10*3/uL (ref 0.7–3.1)
Lymphs: 15 %
MCH: 28.7 pg (ref 26.6–33.0)
MCHC: 33.1 g/dL (ref 31.5–35.7)
MCV: 87 fL (ref 79–97)
Monocytes Absolute: 0.3 10*3/uL (ref 0.1–0.9)
Monocytes: 5 %
Neutrophils Absolute: 4.3 10*3/uL (ref 1.4–7.0)
Neutrophils: 78 %
RBC: 4.29 x10E6/uL (ref 4.14–5.80)
RDW: 13.6 % (ref 11.6–15.4)
WBC: 5.5 10*3/uL (ref 3.4–10.8)

## 2021-10-27 LAB — SPECIMEN STATUS REPORT

## 2021-10-30 ENCOUNTER — Other Ambulatory Visit (HOSPITAL_COMMUNITY): Payer: Self-pay | Admitting: *Deleted

## 2021-10-31 ENCOUNTER — Encounter (HOSPITAL_COMMUNITY): Payer: Self-pay | Admitting: Physician Assistant

## 2021-10-31 ENCOUNTER — Other Ambulatory Visit: Payer: Self-pay

## 2021-10-31 ENCOUNTER — Ambulatory Visit (INDEPENDENT_AMBULATORY_CARE_PROVIDER_SITE_OTHER): Payer: No Payment, Other | Admitting: Physician Assistant

## 2021-10-31 VITALS — BP 113/71 | HR 84 | Ht 73.0 in | Wt 183.0 lb

## 2021-10-31 DIAGNOSIS — F251 Schizoaffective disorder, depressive type: Secondary | ICD-10-CM

## 2021-10-31 DIAGNOSIS — F332 Major depressive disorder, recurrent severe without psychotic features: Secondary | ICD-10-CM

## 2021-10-31 DIAGNOSIS — F411 Generalized anxiety disorder: Secondary | ICD-10-CM

## 2021-10-31 MED ORDER — VENLAFAXINE HCL ER 75 MG PO CP24
75.0000 mg | ORAL_CAPSULE | Freq: Every day | ORAL | 1 refills | Status: DC
  Filled 2021-10-31: qty 30, 30d supply, fill #0

## 2021-10-31 NOTE — Progress Notes (Signed)
BH MD/PA/NP OP Progress Note  10/31/2021 1:32 PM Sean Shepherd  MRN:  [id number]  Chief Complaint:  Chief Complaint  Patient presents with   Medication Management    F/U MM   HPI:   Sean Shepherd "Sean Shepherd" is a 46 year old male with a past psychiatric history significant for schizoaffective disorder (bipolar type) and generalized anxiety disorder who presents to [facility] for follow-up and medication management.  During the last encounter, patient was documented being on the following medications: Clozaril 200 mg at bedtime as well as Clozaril 25 mg daily.  At the start of this encounter, patient has been taking Effexor XR 75 mg for the management of his depressive symptoms and anxiety and has discontinued taking his daily dose of Clozaril 25 mg daily.  Patient reports that Effexor has been helpful with managing his anxiety.  Although he states that the management of his anxiety has not been perfect, it has been better since the reintroduction of his Effexor.  He still endorses some depressive symptoms but states that it has not been as dominating.  He endorses depressive symptoms mostly every day; however, his symptoms do not last all day.  Patient endorses the following depressive symptoms: lack of motivation and hopelessness towards the future.  Patient's symptoms are alleviated through watching sports and interacting with family.  He reports that he recently went on a job with his sister. Patient states that he continues to attend [facility] 3 times a week and has also been volunteering at [employer].  Patient endorses anxiety and rates his anxiety as 7 out of 10.  Patient states that he has been on his Effexor XR for roughly a week and denies experiencing any adverse side effects.  A PHQ-9 screen was performed with the patient scoring a 15.  A GAD-7 screen was also performed with the patient scoring a 15.  Patient is alert and oriented x4, calm,  cooperative, and fully engaged in conversation during the encounter.  Patient reports that he is relatively calm but feels that his anxiety continues to remain just under the surface.  Patient denies suicidal or homicidal ideations.  He further denies auditory or visual hallucinations and does not appear to be responding to internal/external stimuli.  Patient endorses good sleep and receives on average 8 hours of sleep each night.  Patient endorses good appetite and eats on average 3 meals per day.  Patient denies alcohol consumption, tobacco use, and illicit drug use.  Visit Diagnosis:    ICD-10-CM   1. Schizoaffective disorder, depressive type (HCC)  F25.1     2. Severe episode of recurrent major depressive disorder, without psychotic features (HCC)  F33.2 venlafaxine XR (EFFEXOR XR) 75 MG 24 hr capsule    3. Generalized anxiety disorder  F41.1 venlafaxine XR (EFFEXOR XR) 75 MG 24 hr capsule      Past Psychiatric History:  Schizoaffective disorder (bipolar type) Major depressive disorder Insomnia Generalized anxiety disorder  Past Medical History:  Past Medical History:  Diagnosis Date   Anxiety disorder, unspecified 01/10/2021   Bipolar disorder (HCC)    Depression    Schizoaffective disorder (HCC)     Past Surgical History:  Procedure Laterality Date   LAPAROTOMY N/A 12/25/2020   Procedure: EXPLORATORY LAPAROTOMY; REPAIR OF GROIN LACERATIONS X2, RIGHT THIGH LACERATION X 2; REPAIR RIGHT NECK AND LEFT NECK LACERATION AND REPAIR OF LEFT WRIST LACERATION;  Surgeon: [name], MD;  Location: [facility] OR;  Service: General;  Laterality: N/A;  NO PAST SURGERIES      Family Psychiatric History:  Father - Depression, currently taking Zoloft Sister - unsure, hx of trouble with the law, unsure of med taken  Family History: History reviewed. No pertinent family history.  Social History:  Social History   Socioeconomic History   Marital status: Single    Spouse name: Not on file    Number of children: 0   Years of education: Not on file   Highest education level: Not on file  Occupational History   Not on file  Tobacco Use   Smoking status: Former    Types: Cigarettes    Quit date: 07/31/2014    Years since quitting: 7.2   Smokeless tobacco: Never  Vaping Use   Vaping Use: Every day   Substances: THC, CBD  Substance and Sexual Activity   Alcohol use: Not Currently   Drug use: Yes    Types: Marijuana   Sexual activity: Never  Other Topics Concern   Not on file  Social History Narrative   Not on file   Social Determinants of Health   Financial Resource Strain: Not on file  Food Insecurity: Not on file  Transportation Needs: Not on file  Physical Activity: Not on file  Stress: Not on file  Social Connections: Not on file    Allergies: No Known Allergies  Metabolic Disorder Labs: Lab Results  Component Value Date   HGBA1C 5.2 01/09/2021   MPG 102.54 01/09/2021   MPG 117 06/28/2020   No results found for: PROLACTIN Lab Results  Component Value Date   CHOL 188 11/18/2020   TRIG 124 11/18/2020   HDL 69 11/18/2020   CHOLHDL 2.7 11/18/2020   VLDL 25 11/18/2020   LDLCALC 94 11/18/2020   LDLCALC 128 (H) 06/28/2020   Lab Results  Component Value Date   TSH 0.828 01/25/2021   TSH 0.817 11/18/2020    Therapeutic Level Labs: No results found for: LITHIUM No results found for: VALPROATE No components found for:  CBMZ  Current Medications: Current Outpatient Medications  Medication Sig Dispense Refill   cloZAPine (CLOZARIL) 100 MG tablet Take 2 tablets (200 mg total) by mouth at bedtime. 28 tablet 11   Melatonin 10 MG CAPS Take 10 mg by mouth at bedtime. 30 capsule 1   cloZAPine (CLOZARIL) 25 MG tablet Take 1 tablet (25 mg total) by mouth daily. (Patient not taking: Reported on 10/31/2021) 30 tablet 11   venlafaxine XR (EFFEXOR XR) 75 MG 24 hr capsule Take 1 capsule (75 mg total) by mouth daily. 30 capsule 1   No current  facility-administered medications for this visit.     Musculoskeletal: Strength & Muscle Tone: within normal limits Gait & Station: normal Patient leans: N/A  Psychiatric Specialty Exam: Review of Systems  Psychiatric/Behavioral:  Negative for decreased concentration, dysphoric mood, hallucinations, self-injury, sleep disturbance and suicidal ideas. The patient is nervous/anxious. The patient is not hyperactive.    Blood pressure 113/71, pulse 84, height 6\' 1"  (1.854 m), weight 183 lb (83 kg).Body mass index is 24.14 kg/m.  General Appearance: Well Groomed  Eye Contact:  Good  Speech:  Clear and Coherent and Normal Rate  Volume:  Normal  Mood:  Anxious and Depressed  Affect:  Congruent  Thought Process:  Coherent, Goal Directed, and Descriptions of Associations: Intact  Orientation:  Full (Time, Place, and Person)  Thought Content: WDL   Suicidal Thoughts:  No  Homicidal Thoughts:  No  Memory:  Immediate;  Good Recent;   Good Remote;   Good  Judgement:  Good  Insight:  Good  Psychomotor Activity:  Normal  Concentration:  Concentration: Good and Attention Span: Good  Recall:  Good  Fund of Knowledge: Good  Language: Good  Akathisia:  No  Handed:  Right  AIMS (if indicated): not done  Assets:  Communication Skills Desire for Improvement Housing Social Support  ADL's:  Intact  Cognition: WNL  Sleep:  Good   Screenings: AIMS    Flowsheet Row [facility]  AIMS Total Score 0      CAGE-AID    Flowsheet Row ED to Hosp-Admission (Discharged) from 12/25/2020 in [facility]  SURGICAL  CAGE-AID Score 0      GAD-7    Flowsheet Row Clinical Support from 10/31/2021 in [facility] Office Visit from 10/03/2021 in [facility] Clinical Support from 08/15/2021 in [facility] Office Visit from  07/13/2021 in [facility] Office Visit from 05[facility]14/2021 in [facility]  Total GAD-7 Score 15 13 15 17 18       PHQ2-9    Flowsheet Row Clinical Support from 10/31/2021 in [facility] Office Visit from 10/03/2021 in [facility] Clinical Support from 08/15/2021 in [facility] Office Visit from 07/13/2021 in [facility] Office Visit from 05/14/2021 in [facility]  PHQ-2 Total Score 4 4 4 6 4   PHQ-9 Total Score 15 10 12 17 16       Flowsheet Row Clinical Support from 10/31/2021 in [facility] Office Visit from 10/03/2021 in [facility] Clinical Support from 08/15/2021 in [facility]  C-SSRS RISK CATEGORY Moderate Risk Moderate Risk Moderate Risk        Assessment and Plan:   Sean Shepherd "Sean Shepherd" is a 46 year old male with a past psychiatric history significant for schizoaffective disorder (bipolar type) and generalized anxiety disorder who presents to [facility] for follow-up and medication management.  He reports that he has reintroduced the use of Effexor XR 75 mg daily for the management of his anxiety.  Patient reports that the medication has been helpful but still continues to endorse some anxiety and depression.  Patient reports no other issues with the use of his Clozaril 200 mg at bedtime.  Patient would like to continue taking both Effexor and Clozaril for the management of his symptoms.  Patient's medications to be e-prescribed to pharmacy of choice.  Collaboration of Care: Collaboration of Care: Medication Management AEB for managing patient's psychiatric medications, Psychiatrist AEB patient being followed by mental health provider, and Referral or follow-up with  counselor/therapist AEB patient is receiving counseling/therapy at [facility] 3 times a week  Patient/Guardian was advised Release of Information must be obtained prior to any record release in order to collaborate their care with an outside provider. Patient/Guardian was advised if they have not already done so to contact the registration department to sign all necessary forms in order for [place] to release information regarding their care.   Consent: Patient/Guardian gives verbal consent for treatment and assignment of benefits for services provided during this visit. Patient/Guardian expressed understanding and agreed to proceed.   1. Severe episode of recurrent major depressive disorder, without psychotic features (HCC)  -  venlafaxine XR (EFFEXOR XR) 75 MG 24 hr capsule; Take 1 capsule (75 mg total) by mouth daily.  Dispense: 30 capsule; Refill: 1  2. Generalized anxiety disorder  - venlafaxine XR (EFFEXOR XR) 75 MG 24 hr capsule; Take 1 capsule (75 mg total) by mouth daily.  Dispense: 30 capsule; Refill: 1  3. Schizoaffective disorder, depressive type (HCC) Patient to continue taking olanzapine 200 mg at bedtime for the management of the schizoaffective disorder  Patient to follow up in 4 weeks Provider spent a total of 10 minutes with the patient/reviewing patient's chart  Meta Hatchet, PA 10/31/2021, 1:32 PM

## 2021-11-01 ENCOUNTER — Other Ambulatory Visit (HOSPITAL_COMMUNITY): Payer: Self-pay | Admitting: Physician Assistant

## 2021-11-01 NOTE — Progress Notes (Signed)
Provider discontinuing patient's Clozaril 25 mg daily. Patient to instead continue taking venlafaxine XR 75 mg daily for the management of his anxiety. ?

## 2021-11-09 ENCOUNTER — Ambulatory Visit (HOSPITAL_COMMUNITY): Payer: No Payment, Other | Admitting: *Deleted

## 2021-11-09 ENCOUNTER — Telehealth (HOSPITAL_COMMUNITY): Payer: Self-pay | Admitting: *Deleted

## 2021-11-09 ENCOUNTER — Other Ambulatory Visit: Payer: Self-pay

## 2021-11-09 ENCOUNTER — Other Ambulatory Visit (HOSPITAL_COMMUNITY): Payer: Self-pay | Admitting: Physician Assistant

## 2021-11-09 DIAGNOSIS — F411 Generalized anxiety disorder: Secondary | ICD-10-CM

## 2021-11-09 DIAGNOSIS — Z79899 Other long term (current) drug therapy: Secondary | ICD-10-CM

## 2021-11-09 DIAGNOSIS — F332 Major depressive disorder, recurrent severe without psychotic features: Secondary | ICD-10-CM

## 2021-11-09 MED ORDER — VENLAFAXINE HCL ER 150 MG PO CP24
150.0000 mg | ORAL_CAPSULE | Freq: Every day | ORAL | 1 refills | Status: DC
  Filled 2021-11-09 – 2021-11-28 (×2): qty 30, 30d supply, fill #0

## 2021-11-09 NOTE — Telephone Encounter (Signed)
[name] PA increased his venlafaxine to 150 mg to manage his anxiety. Writer called to inform him of the change and to let him know a new rx was called in for him when he runs out of the 75 mg he currently has taking two qd to equal 150 mg. He verbalized his understanding.  ?

## 2021-11-09 NOTE — Progress Notes (Signed)
-  Ladainian in for his CBC w diff to be drawn tdhis am for his ongoing use of Clozaril. Today his blood was drawn from his inner right arm without issue. He reported he feels some what better with the Venalfaxine in the am but still not great, and feels there is room for improvement and his next appt is in early April with [name] PA. Will forward request for [name] to consider an increase in his Venalfaxine as per [place] he discussed with him. He does continue to vol once a week at [employer] and enjoys it. He also still attend [employer]. Confirmation # for todays lab is [id number]. ?

## 2021-11-09 NOTE — Progress Notes (Signed)
Patient presented today to our clinic for lab work.  He reports that he still feels anxious and is interested in increasing his dosage of venlafaxine.  Provider to increase patient's venlafaxine from 75 mg to 150 mg for the management of his anxiety and/or depressive symptoms.  Patient's medications to be e-prescribed to pharmacy of choice. ?

## 2021-11-10 LAB — CBC WITH DIFFERENTIAL
Basophils Absolute: 0 10*3/uL (ref 0.0–0.2)
Basos: 0 %
EOS (ABSOLUTE): 0.1 10*3/uL (ref 0.0–0.4)
Eos: 2 %
Hematocrit: 39 % (ref 37.5–51.0)
Hemoglobin: 12.8 g/dL — ABNORMAL LOW (ref 13.0–17.7)
Immature Grans (Abs): 0 10*3/uL (ref 0.0–0.1)
Immature Granulocytes: 0 %
Lymphocytes Absolute: 1 10*3/uL (ref 0.7–3.1)
Lymphs: 20 %
MCH: 28.5 pg (ref 26.6–33.0)
MCHC: 32.8 g/dL (ref 31.5–35.7)
MCV: 87 fL (ref 79–97)
Monocytes Absolute: 0.3 10*3/uL (ref 0.1–0.9)
Monocytes: 6 %
Neutrophils Absolute: 3.7 10*3/uL (ref 1.4–7.0)
Neutrophils: 72 %
RBC: 4.49 x10E6/uL (ref 4.14–5.80)
RDW: 13.3 % (ref 11.6–15.4)
WBC: 5.2 10*3/uL (ref 3.4–10.8)

## 2021-11-13 NOTE — Telephone Encounter (Signed)
Message acknowledged and reviewed.

## 2021-11-16 ENCOUNTER — Other Ambulatory Visit: Payer: Self-pay

## 2021-11-23 ENCOUNTER — Encounter (HOSPITAL_COMMUNITY): Payer: Self-pay

## 2021-11-23 ENCOUNTER — Ambulatory Visit (HOSPITAL_COMMUNITY): Payer: No Payment, Other | Admitting: *Deleted

## 2021-11-23 VITALS — BP 111/77 | HR 91 | Ht 73.0 in | Wt 184.0 lb

## 2021-11-23 DIAGNOSIS — Z79899 Other long term (current) drug therapy: Secondary | ICD-10-CM

## 2021-11-23 NOTE — Progress Notes (Signed)
In as scheduled for his bi monthly lab draw for CBC w diff for his use of Clozaril. Specimen drawn from R arm without issue. He reports he took the increase in the Effexor for a few days but didn't like it and took himself down to one a day. His face looks puffy this am, when asked about it says he over slept. His vitals were with in normal limits this am. No complaints offered. He got a new bill from services here, he is indigent and should not be getting a bill, he is to bring it in tues and will give it to my supervisor to resolve. ?

## 2021-11-24 LAB — CBC WITH DIFFERENTIAL
Basophils Absolute: 0 10*3/uL (ref 0.0–0.2)
Basos: 1 %
EOS (ABSOLUTE): 0.1 10*3/uL (ref 0.0–0.4)
Eos: 3 %
Hematocrit: 39.1 % (ref 37.5–51.0)
Hemoglobin: 12.6 g/dL — ABNORMAL LOW (ref 13.0–17.7)
Immature Grans (Abs): 0 10*3/uL (ref 0.0–0.1)
Immature Granulocytes: 0 %
Lymphocytes Absolute: 0.9 10*3/uL (ref 0.7–3.1)
Lymphs: 21 %
MCH: 28.5 pg (ref 26.6–33.0)
MCHC: 32.2 g/dL (ref 31.5–35.7)
MCV: 89 fL (ref 79–97)
Monocytes Absolute: 0.3 10*3/uL (ref 0.1–0.9)
Monocytes: 6 %
Neutrophils Absolute: 3.1 10*3/uL (ref 1.4–7.0)
Neutrophils: 69 %
RBC: 4.42 x10E6/uL (ref 4.14–5.80)
RDW: 13.6 % (ref 11.6–15.4)
WBC: 4.4 10*3/uL (ref 3.4–10.8)

## 2021-11-28 ENCOUNTER — Other Ambulatory Visit: Payer: Self-pay

## 2021-11-29 ENCOUNTER — Other Ambulatory Visit: Payer: Self-pay

## 2021-11-30 ENCOUNTER — Other Ambulatory Visit: Payer: Self-pay

## 2021-12-05 ENCOUNTER — Ambulatory Visit (INDEPENDENT_AMBULATORY_CARE_PROVIDER_SITE_OTHER): Payer: No Payment, Other | Admitting: Physician Assistant

## 2021-12-05 ENCOUNTER — Encounter (HOSPITAL_COMMUNITY): Payer: Self-pay | Admitting: Physician Assistant

## 2021-12-05 ENCOUNTER — Other Ambulatory Visit: Payer: Self-pay

## 2021-12-05 VITALS — BP 115/78 | HR 91 | Ht 73.0 in | Wt 181.0 lb

## 2021-12-05 DIAGNOSIS — F411 Generalized anxiety disorder: Secondary | ICD-10-CM | POA: Diagnosis not present

## 2021-12-05 DIAGNOSIS — F332 Major depressive disorder, recurrent severe without psychotic features: Secondary | ICD-10-CM

## 2021-12-05 DIAGNOSIS — F251 Schizoaffective disorder, depressive type: Secondary | ICD-10-CM

## 2021-12-05 MED ORDER — VENLAFAXINE HCL ER 75 MG PO CP24
75.0000 mg | ORAL_CAPSULE | Freq: Every day | ORAL | 1 refills | Status: DC
  Filled 2021-12-05: qty 30, 30d supply, fill #0
  Filled 2022-01-24: qty 30, 30d supply, fill #1

## 2021-12-05 NOTE — Progress Notes (Addendum)
BH MD/PA/NP OP Progress Note ? ?12/05/2021 8:49 PM ?Sean Hallmarkhanning Stcyr  ?MRN:  [id number] ? ?Chief Complaint:  ?Chief Complaint  ?Patient presents with  ? Medication Management  ? ?HPI:  ? ?Sean Shepherd is a 46 year old male with a past psychiatric history significant for schizoaffective disorder (bipolar type), generalized anxiety disorder, major depressive disorder who presents [facility] for follow-up and medication management.  Patient is currently being managed on the following medications:  ? ?Clozaril 200 mg at bedtime. ?Venlafaxine XR (Effexor XR) 150 mg 24-hour capsule daily ? ?Patient reports that he stopped taking his venlafaxine due to experiencing side effects from the medication.  Patient reports that he was experiencing elevated anxiety, dizzy spells, and disorientation while on the medication.  He reports that he has been off the medication for roughly 4 or 5 days.  Patient rates his anxiety as 7 out of 10 but denies any new stressors at this time.  He expresses that he acknowledges that it has been almost a year since injured himself. ? ?Patient endorses depression characterized by the following symptoms: lack of motivation and lack of interest in activities or hobbies.  Patient reports that he still attends [facility] and has been solitary in at [employer] on Mondays and Fridays.  Patient reports that he enjoys going to [facility] because he is able to interact with individuals and he generally enjoys his time while there.  Patient has been using the following skills to help with alleviating his symptoms: focusing on breathing, exercising, and hanging out with family.  A PHQ-9 screen was performed with the patient scoring a 10.  A GAD-7 screen was also performed with the patient scored a 12.  A [place] Suicide Severity Scale was performed with the patient being considered moderate risk. ? ?Patient is alert and oriented x4, calm, cooperative, and  fully engaged in conversation during the encounter.  Patient endorses being anxious.  He reports occasionally feeling nervous about meeting with people at [facility] [employer] often stating that he feels like he will pass out and die.  Patient denies suicidal or homicidal ideations.  He occasionally endorses auditory hallucinations characterized by hearing voices about things that have happened in the past.  Patient denies visual hallucinations and does not appear to be responding to internal/external stimuli.  Patient endorses good sleep and receives on average 7 to 8 hours of sleep each night.  Patient endorses good appetite and eats on average 3 meals per day.  Patient denies alcohol consumption, tobacco use, and illicit drug use. ? ?Visit Diagnosis:  ?  ICD-10-CM   ?1. Severe episode of recurrent major depressive disorder, without psychotic features (HCC)  F33.2 venlafaxine XR (EFFEXOR XR) 75 MG 24 hr capsule  ?  ?2. Generalized anxiety disorder  F41.1 venlafaxine XR (EFFEXOR XR) 75 MG 24 hr capsule  ?  ? ? ?Past Psychiatric History:  ?Schizoaffective disorder (bipolar type) ?Major depressive disorder ?Insomnia ?Generalized anxiety disorder ? ?Past Medical History:  ?Past Medical History:  ?Diagnosis Date  ? Anxiety disorder, unspecified 01/10/2021  ? Bipolar disorder (HCC)   ? Depression   ? Schizoaffective disorder (HCC)   ?  ?Past Surgical History:  ?Procedure Laterality Date  ? LAPAROTOMY N/A 12/25/2020  ? Procedure: EXPLORATORY LAPAROTOMY; REPAIR OF GROIN LACERATIONS X2, RIGHT THIGH LACERATION X 2; REPAIR RIGHT NECK AND LEFT NECK LACERATION AND REPAIR OF LEFT WRIST LACERATION;  Surgeon: Violeta Gelinashompson, Burke, MD;  [facility];  Service: General;  Laterality:  N/A;  ? NO PAST SURGERIES    ? ? ?Family Psychiatric History:  ?Father - Depression, currently taking Zoloft ?Sister - unsure, hx of trouble with the law, unsure of med taken ? ?Family History: History reviewed. No pertinent family history. ? ?Social History:   ?Social History  ? ?Socioeconomic History  ? Marital status: Single  ?  Spouse name: Not on file  ? Number of children: 0  ? Years of education: Not on file  ? Highest education level: Not on file  ?Occupational History  ? Not on file  ?Tobacco Use  ? Smoking status: Former  ?  Types: Cigarettes  ?  Quit date: 07/31/2014  ?  Years since quitting: 7.3  ? Smokeless tobacco: Never  ?Vaping Use  ? Vaping Use: Every day  ? Substances: THC, CBD  ?Substance and Sexual Activity  ? Alcohol use: Not Currently  ? Drug use: Yes  ?  Types: Marijuana  ? Sexual activity: Never  ?Other Topics Concern  ? Not on file  ?Social History Narrative  ? Not on file  ? ?Social Determinants of Health  ? ?Financial Resource Strain: Not on file  ?Food Insecurity: Not on file  ?Transportation Needs: Not on file  ?Physical Activity: Not on file  ?Stress: Not on file  ?Social Connections: Not on file  ? ? ?Allergies: No Known Allergies ? ?Metabolic Disorder Labs: ?Lab Results  ?Component Value Date  ? HGBA1C 5.2 01/09/2021  ? MPG 102.54 01/09/2021  ? MPG 117 06/28/2020  ? ?No results found for: PROLACTIN ?Lab Results  ?Component Value Date  ? CHOL 188 11/18/2020  ? TRIG 124 11/18/2020  ? HDL 69 11/18/2020  ? CHOLHDL 2.7 11/18/2020  ? VLDL 25 11/18/2020  ? LDLCALC 94 11/18/2020  ? LDLCALC 128 (H) 06/28/2020  ? ?Lab Results  ?Component Value Date  ? TSH 0.828 01/25/2021  ? TSH 0.817 11/18/2020  ? ? ?Therapeutic Level Labs: ?No results found for: LITHIUM ?No results found for: VALPROATE ?No components found for:  CBMZ ? ?Current Medications: ?Current Outpatient Medications  ?Medication Sig Dispense Refill  ? cloZAPine (CLOZARIL) 100 MG tablet Take 2 tablets (200 mg total) by mouth at bedtime. 28 tablet 11  ? Melatonin 10 MG CAPS Take 10 mg by mouth at bedtime. 30 capsule 1  ? venlafaxine XR (EFFEXOR XR) 75 MG 24 hr capsule Take 1 capsule (75 mg total) by mouth daily. 30 capsule 1  ? ?No current facility-administered medications for this visit.   ? ? ? ?Musculoskeletal: ?Strength & Muscle Tone: within normal limits ?Gait & Station: normal ?Patient leans: N/A ? ?Psychiatric Specialty Exam: ?Review of Systems  ?Psychiatric/Behavioral:  Positive for hallucinations. Negative for decreased concentration, dysphoric mood, self-injury, sleep disturbance and suicidal ideas. The patient is nervous/anxious. The patient is not hyperactive.    ?Blood pressure 115/78, pulse 91, height 6\' 1"  (1.854 m), weight 181 lb (82.1 kg), SpO2 98 %.Body mass index is 23.88 kg/m?.  ?General Appearance: Well Groomed  ?Eye Contact:  Good  ?Speech:  Clear and Coherent and Normal Rate  ?Volume:  Normal  ?Mood:  Anxious and Depressed  ?Affect:  Congruent  ?Thought Process:  Coherent, Goal Directed, and Descriptions of Associations: Intact  ?Orientation:  Full (Time, Place, and Person)  ?Thought Content: WDL and Hallucinations: Auditory   ?Suicidal Thoughts:  No  ?Homicidal Thoughts:  No  ?Memory:  Immediate;   Good ?Recent;   Good ?Remote;   Good  ?Judgement:  Good  ?  Insight:  Good  ?Psychomotor Activity:  Normal  ?Concentration:  Concentration: Good and Attention Span: Good  ?Recall:  Good  ?Fund of Knowledge: Good  ?Language: Good  ?Akathisia:  No  ?Handed:  Right  ?AIMS (if indicated): not done  ?Assets:  Communication Skills ?Desire for Improvement ?Housing ?Social Support  ?ADL's:  Intact  ?Cognition: WNL  ?Sleep:  Good  ? ?Screenings: ?AIMS   ? ?Flowsheet Row [facility]  ?AIMS Total Score 0  ? ?  ? ?CAGE-AID   ? ?Flowsheet Row ED to Hosp-Admission (Discharged) from 12/25/2020 in [facility]  SURGICAL  ?CAGE-AID Score 0  ? ?  ? ?GAD-7   ? ?Flowsheet Row Clinical Support from 12/05/2021 in [facility] Clinical Support from 10/31/2021 in [facility] Office Visit from 10[facility]03/2021 in [facility] Clinical Support  from 08/15/2021 in [facility] Office Visit from 07/13/2021 in [facility]  ?Total GAD-7 Score 12 15 13 15 17   ? ?  ? ?PHQ2-9   ? ?Flowsheet Row Cli

## 2021-12-07 ENCOUNTER — Ambulatory Visit (HOSPITAL_COMMUNITY): Payer: No Payment, Other | Admitting: *Deleted

## 2021-12-07 ENCOUNTER — Other Ambulatory Visit (HOSPITAL_COMMUNITY): Payer: Self-pay | Admitting: Physician Assistant

## 2021-12-07 DIAGNOSIS — Z79899 Other long term (current) drug therapy: Secondary | ICD-10-CM

## 2021-12-07 NOTE — Progress Notes (Signed)
In as scheduled for his bi monthly lab draw for his CBC w Diff, drawn today from his R ARM without issue. He brought bills with him from previous lab draws, fill forward them to my boss. Confirmation number [id number]. To return tues to pick up his Clozaril and in two weeks for next lab draw. ?

## 2021-12-10 LAB — CBC WITH DIFFERENTIAL
Basophils Absolute: 0 10*3/uL (ref 0.0–0.2)
Basos: 1 %
EOS (ABSOLUTE): 0.1 10*3/uL (ref 0.0–0.4)
Eos: 3 %
Hematocrit: 39.6 % (ref 37.5–51.0)
Hemoglobin: 12.8 g/dL — ABNORMAL LOW (ref 13.0–17.7)
Immature Grans (Abs): 0 10*3/uL (ref 0.0–0.1)
Immature Granulocytes: 0 %
Lymphocytes Absolute: 1.2 10*3/uL (ref 0.7–3.1)
Lymphs: 25 %
MCH: 28.6 pg (ref 26.6–33.0)
MCHC: 32.3 g/dL (ref 31.5–35.7)
MCV: 89 fL (ref 79–97)
Monocytes Absolute: 0.4 10*3/uL (ref 0.1–0.9)
Monocytes: 8 %
Neutrophils Absolute: 3.1 10*3/uL (ref 1.4–7.0)
Neutrophils: 63 %
RBC: 4.47 x10E6/uL (ref 4.14–5.80)
RDW: 13.4 % (ref 11.6–15.4)
WBC: 4.8 10*3/uL (ref 3.4–10.8)

## 2021-12-12 ENCOUNTER — Telehealth (HOSPITAL_COMMUNITY): Payer: Self-pay | Admitting: *Deleted

## 2021-12-12 NOTE — Telephone Encounter (Signed)
Called writer yesterday to state he would be in today to pick up his Clozaril. He was just here to pick it up. He reports he is doing fine. Asked him to update me on where he was with the process of applying for disability. States he got a call last week re it, its in process but he doesn't know the determination time frame. ?

## 2021-12-21 ENCOUNTER — Ambulatory Visit (HOSPITAL_COMMUNITY): Payer: No Payment, Other | Admitting: *Deleted

## 2021-12-21 ENCOUNTER — Other Ambulatory Visit (HOSPITAL_COMMUNITY): Payer: Self-pay | Admitting: Physician Assistant

## 2021-12-21 DIAGNOSIS — Z79899 Other long term (current) drug therapy: Secondary | ICD-10-CM

## 2021-12-21 NOTE — Progress Notes (Signed)
CBC with diff drawn this am from patients R ARM without difficulty. Confirmation code for today is [id number]. He continues to report ongoing anxiety that he states has not been benefitted by medication changes so far. He did go yesterday pm to local baseball game and enjoyed it was was anxious with all the people around him. He is to see his provider on 5/10 and reminded him to ask him at that time to change his lab frequency to monthly as he will be at every 2 weeks for 6 months in mid May. ?

## 2021-12-24 LAB — CBC WITH DIFFERENTIAL
Basophils Absolute: 0 10*3/uL (ref 0.0–0.2)
Basos: 0 %
EOS (ABSOLUTE): 0.1 10*3/uL (ref 0.0–0.4)
Eos: 2 %
Hematocrit: 37.6 % (ref 37.5–51.0)
Hemoglobin: 12.3 g/dL — ABNORMAL LOW (ref 13.0–17.7)
Immature Grans (Abs): 0 10*3/uL (ref 0.0–0.1)
Immature Granulocytes: 0 %
Lymphocytes Absolute: 1 10*3/uL (ref 0.7–3.1)
Lymphs: 19 %
MCH: 29.5 pg (ref 26.6–33.0)
MCHC: 32.7 g/dL (ref 31.5–35.7)
MCV: 90 fL (ref 79–97)
Monocytes Absolute: 0.3 10*3/uL (ref 0.1–0.9)
Monocytes: 7 %
Neutrophils Absolute: 3.8 10*3/uL (ref 1.4–7.0)
Neutrophils: 72 %
RBC: 4.17 x10E6/uL (ref 4.14–5.80)
RDW: 13.5 % (ref 11.6–15.4)
WBC: 5.1 10*3/uL (ref 3.4–10.8)

## 2021-12-24 LAB — SPECIMEN STATUS REPORT

## 2021-12-26 IMAGING — DX DG CHEST 2V
2 series · 2 of 2 positions shown · non-contrast
Comparison: None.

CLINICAL DATA: Shortness of breath.  Chest tightness.

EXAM:
CHEST - 2 VIEW

[chest pa]
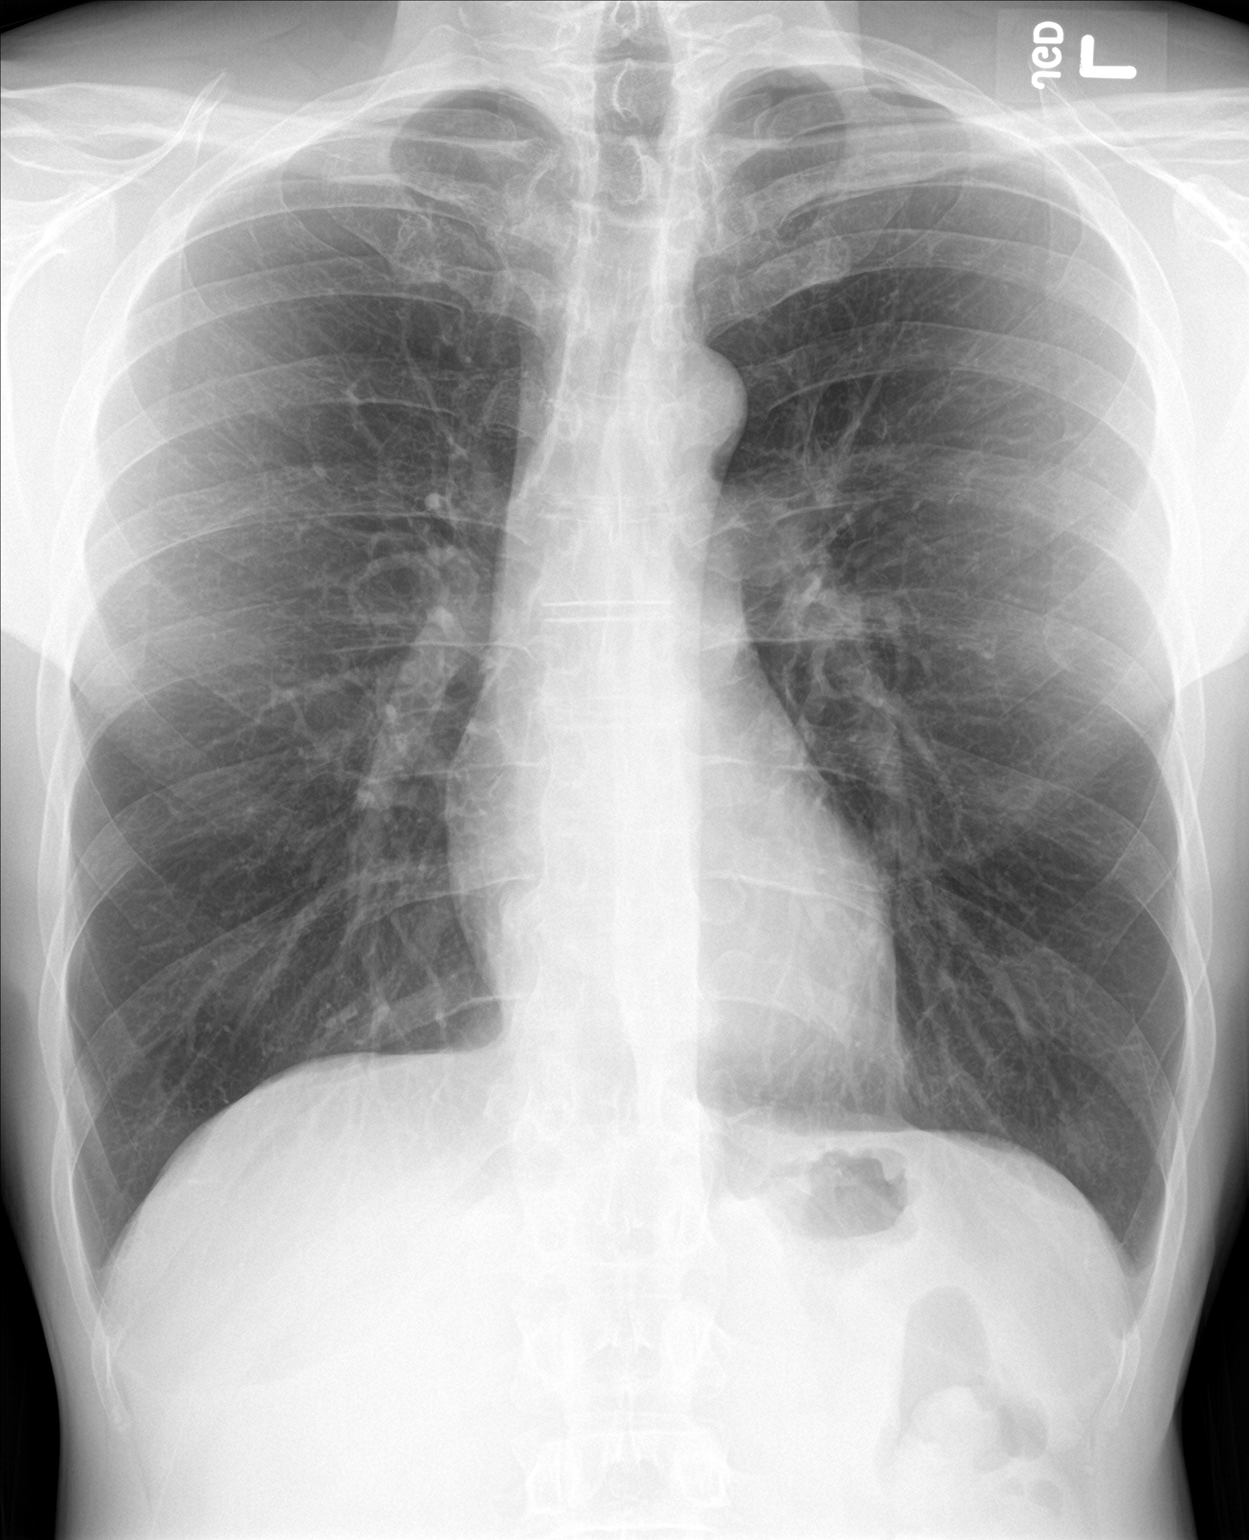

[chest lat]
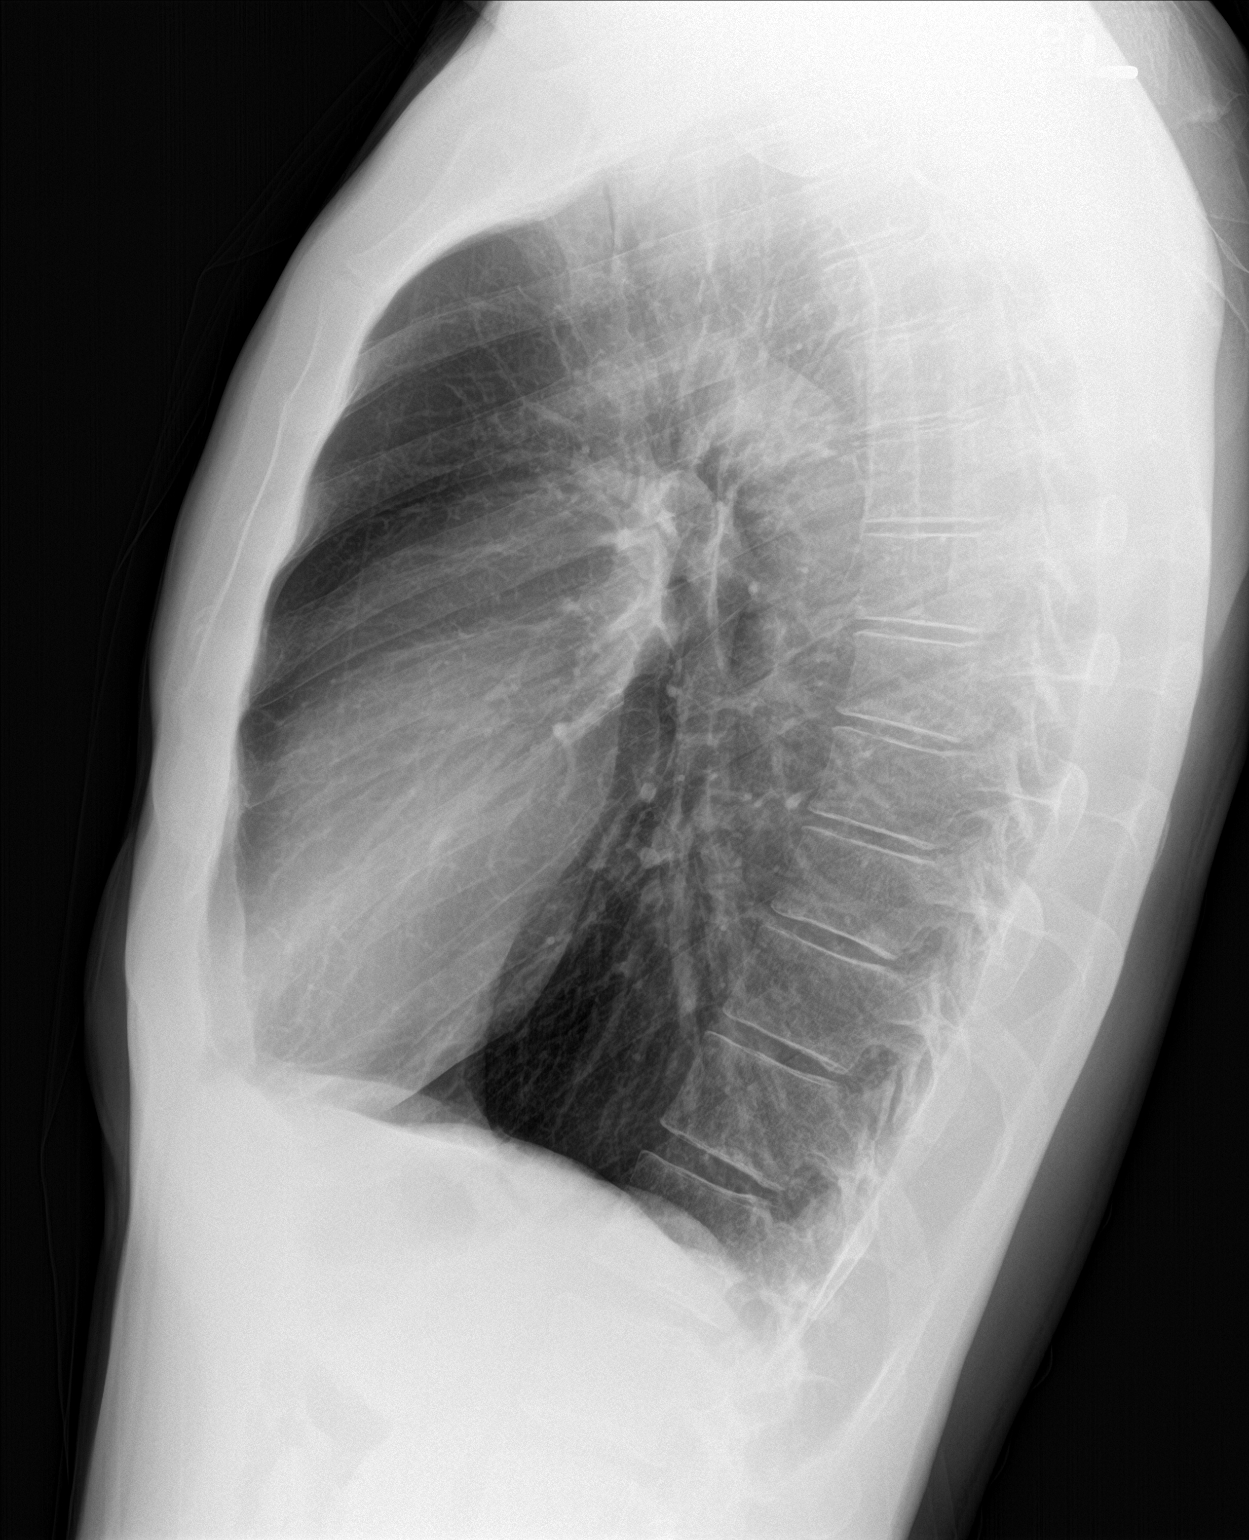

[2 of 2 positions shown; findings below may reference images not displayed]

FINDINGS: The heart size and mediastinal contours are within normal limits.
Both lungs are clear. The visualized skeletal structures are
unremarkable.
IMPRESSION: No active cardiopulmonary disease.

## 2022-01-04 ENCOUNTER — Ambulatory Visit (HOSPITAL_COMMUNITY): Payer: No Payment, Other | Admitting: *Deleted

## 2022-01-04 DIAGNOSIS — Z79899 Other long term (current) drug therapy: Secondary | ICD-10-CM

## 2022-01-04 NOTE — Progress Notes (Signed)
Lab drawn for his every two week CBC with Diff for monitoring of his long time use of Clozaril. Today lab drawn from [place]. Today he was informed is the last day of every two week draws, he is changing going forward to every one month as he has been on Clozaril now for a year. He is glad for the reduction in frequency. He sees his provider on Wednesday and will also pick up his supply for his Clozaril that day ?

## 2022-01-05 LAB — CBC WITH DIFFERENTIAL
Basophils Absolute: 0 10*3/uL (ref 0.0–0.2)
Basos: 1 %
EOS (ABSOLUTE): 0.1 10*3/uL (ref 0.0–0.4)
Eos: 2 %
Hematocrit: 39.8 % (ref 37.5–51.0)
Hemoglobin: 13.1 g/dL (ref 13.0–17.7)
Immature Grans (Abs): 0 10*3/uL (ref 0.0–0.1)
Immature Granulocytes: 0 %
Lymphocytes Absolute: 1.1 10*3/uL (ref 0.7–3.1)
Lymphs: 20 %
MCH: 28.7 pg (ref 26.6–33.0)
MCHC: 32.9 g/dL (ref 31.5–35.7)
MCV: 87 fL (ref 79–97)
Monocytes Absolute: 0.3 10*3/uL (ref 0.1–0.9)
Monocytes: 6 %
Neutrophils Absolute: 3.9 10*3/uL (ref 1.4–7.0)
Neutrophils: 71 %
RBC: 4.56 x10E6/uL (ref 4.14–5.80)
RDW: 13.2 % (ref 11.6–15.4)
WBC: 5.5 10*3/uL (ref 3.4–10.8)

## 2022-01-07 ENCOUNTER — Telehealth (HOSPITAL_COMMUNITY): Payer: Self-pay | Admitting: *Deleted

## 2022-01-07 NOTE — Telephone Encounter (Signed)
Put in the CBC results from 01/04/22 into [facility] and verified with Clozaril that his meds would be received by tomorrow for a one month supply as he is now a every month lab draw.  ?

## 2022-01-09 ENCOUNTER — Encounter (HOSPITAL_COMMUNITY): Payer: Self-pay | Admitting: Physician Assistant

## 2022-01-09 ENCOUNTER — Ambulatory Visit (INDEPENDENT_AMBULATORY_CARE_PROVIDER_SITE_OTHER): Payer: No Payment, Other | Admitting: Physician Assistant

## 2022-01-09 VITALS — BP 94/79 | HR 88 | Ht 73.0 in | Wt 182.0 lb

## 2022-01-09 DIAGNOSIS — F331 Major depressive disorder, recurrent, moderate: Secondary | ICD-10-CM

## 2022-01-09 DIAGNOSIS — F251 Schizoaffective disorder, depressive type: Secondary | ICD-10-CM

## 2022-01-09 DIAGNOSIS — F411 Generalized anxiety disorder: Secondary | ICD-10-CM | POA: Diagnosis not present

## 2022-01-09 NOTE — Progress Notes (Signed)
BH MD/PA/NP OP Progress Note ? ?01/09/2022 6:43 PM ?Sean Shepherd  ?MRN:  [id number] ? ?Chief Complaint:  ?Chief Complaint  ?Patient presents with  ? Medication Management  ? ?HPI:  ? ?Sean Shepherd is a 46 year old male with a past psychiatric history significant for schizoaffective disorder (bipolar type), generalized anxiety disorder, major depressive disorder who presents to [facility] for follow-up and medication management.  Patient is currently being managed on the following medications:  ? ?Clozaril 200 mg at bedtime. ?Venlafaxine XR (Effexor XR) 75 mg 24-hour capsule daily ? ?Patient reports that he has been experiencing some difficulty getting out of bed through the use of Clozaril.  Although he experiences difficulty getting out of bed, he does report that Clozaril has been helpful in managing his sleep.  Patient also reports that he increased his dosage of venlafaxine and has been taking 150 mg daily.  Patient notes that he has experienced improvements in his anxiety but still continues to endorse some anxiety upon first waking up in the morning.  Patient rates his anxiety as 6 out of 10 and states that when he takes his venlafaxine, his anxiety tends to smoothen the duration of the day.  Patient denies any new stressors at this time. ? ?Patient endorses depression but states that his depression has lessened since taking venlafaxine 150 mg daily.  Patient's depressive symptoms are characterized by lack of interest in doing activities and low mood.  Patient states that his depressive symptoms are alleviated when keeping himself active.  One of the activities that the patient enjoys doing is volunteering at [employer].  Patient endorses having a counselor that he sees at [facility] house every Wednesday.  He also adds that he occasionally helps out in the kitchen when visiting [facility]  Patient shows interest in wanting to make a career out of working at  [employer].  A PHQ-9 screen was performed with the patient scoring a 10.  A GAD-7 screen was also performed with the patient scored a 12. ? ?Patient is alert and oriented x4, calm, cooperative, and fully engaged in conversation during the encounter.  Patient endorses being relatively calm but states that his anxiety remains just below the surface.  Patient denies suicidal or homicidal ideations.  He further denies auditory or visual hallucinations and does not appear to be responding to internal/external stimuli.  Patient endorses fair sleep and receives on average 8 hours of sleep each night.  Patient states that he often finds himself waking up constantly, but generally feels well rested upon awakening.  Patient endorses good appetite and eats on average 3 meals per day.  Patient denies alcohol consumption, tobacco use, and illicit drug use. ? ?Visit Diagnosis:  ?  ICD-10-CM   ?1. Schizoaffective disorder, depressive type (HCC)  F25.1   ?  ?2. Generalized anxiety disorder  F41.1   ?  ?3. Moderate episode of recurrent major depressive disorder (HCC)  F33.1   ?  ? ? ?Past Psychiatric History:  ?Schizoaffective disorder (bipolar type) ?Major depressive disorder ?Insomnia ?Generalized anxiety disorder ? ?Past Medical History:  ?Past Medical History:  ?Diagnosis Date  ? Anxiety disorder, unspecified 01/10/2021  ? Bipolar disorder (HCC)   ? Depression   ? Schizoaffective disorder (HCC)   ?  ?Past Surgical History:  ?Procedure Laterality Date  ? LAPAROTOMY N/A 12/25/2020  ? Procedure: EXPLORATORY LAPAROTOMY; REPAIR OF GROIN LACERATIONS X2, RIGHT THIGH LACERATION X 2; REPAIR RIGHT NECK AND LEFT NECK LACERATION AND REPAIR  OF LEFT WRIST LACERATION;  Surgeon: [name], MD;  Location: [facility] OR;  Service: General;  Laterality: N/A;  ? NO PAST SURGERIES    ? ? ?Family Psychiatric History:  ?Father - Depression, currently taking Zoloft ?Sister - unsure, hx of trouble with the law, unsure of med taken ? ?Family History: No  family history on file. ? ?Social History:  ?Social History  ? ?Socioeconomic History  ? Marital status: Single  ?  Spouse name: Not on file  ? Number of children: 0  ? Years of education: Not on file  ? Highest education level: Not on file  ?Occupational History  ? Not on file  ?Tobacco Use  ? Smoking status: Former  ?  Types: Cigarettes  ?  Quit date: 07/31/2014  ?  Years since quitting: 7.4  ? Smokeless tobacco: Never  ?Vaping Use  ? Vaping Use: Every day  ? Substances: THC, CBD  ?Substance and Sexual Activity  ? Alcohol use: Not Currently  ? Drug use: Yes  ?  Types: Marijuana  ? Sexual activity: Never  ?Other Topics Concern  ? Not on file  ?Social History Narrative  ? Not on file  ? ?Social Determinants of Health  ? ?Financial Resource Strain: Not on file  ?Food Insecurity: Not on file  ?Transportation Needs: Not on file  ?Physical Activity: Not on file  ?Stress: Not on file  ?Social Connections: Not on file  ? ? ?Allergies: No Known Allergies ? ?Metabolic Disorder Labs: ?Lab Results  ?Component Value Date  ? HGBA1C 5.2 01/09/2021  ? MPG 102.54 01/09/2021  ? MPG 117 06/28/2020  ? ?No results found for: PROLACTIN ?Lab Results  ?Component Value Date  ? CHOL 188 11/18/2020  ? TRIG 124 11/18/2020  ? HDL 69 11/18/2020  ? CHOLHDL 2.7 11/18/2020  ? VLDL 25 11/18/2020  ? LDLCALC 94 11/18/2020  ? LDLCALC 128 (H) 06/28/2020  ? ?Lab Results  ?Component Value Date  ? TSH 0.828 01/25/2021  ? TSH 0.817 11/18/2020  ? ? ?Therapeutic Level Labs: ?No results found for: LITHIUM ?No results found for: VALPROATE ?No components found for:  CBMZ ? ?Current Medications: ?Current Outpatient Medications  ?Medication Sig Dispense Refill  ? cloZAPine (CLOZARIL) 100 MG tablet Take 2 tablets (200 mg total) by mouth at bedtime. 28 tablet 11  ? Melatonin 10 MG CAPS Take 10 mg by mouth at bedtime. 30 capsule 1  ? venlafaxine XR (EFFEXOR XR) 75 MG 24 hr capsule Take 1 capsule (75 mg total) by mouth daily. 30 capsule 1  ? ?No current  facility-administered medications for this visit.  ? ? ? ?Musculoskeletal: ?Strength & Muscle Tone: within normal limits ?Gait & Station: normal ?Patient leans: N/A ? ?Psychiatric Specialty Exam: ?Review of Systems  ?Psychiatric/Behavioral:  Positive for sleep disturbance. Negative for decreased concentration, dysphoric mood, hallucinations, self-injury and suicidal ideas. The patient is nervous/anxious. The patient is not hyperactive.    ?Blood pressure 94/79, pulse 88, height 6\' 1"  (1.854 m), weight 182 lb (82.6 kg), SpO2 92 %.Body mass index is 24.01 kg/m?.  ?General Appearance: Well Groomed  ?Eye Contact:  Good  ?Speech:  Clear and Coherent and Normal Rate  ?Volume:  Normal  ?Mood:  Anxious and Depressed  ?Affect:  Congruent  ?Thought Process:  Coherent, Goal Directed, and Descriptions of Associations: Intact  ?Orientation:  Full (Time, Place, and Person)  ?Thought Content: WDL   ?Suicidal Thoughts:  No  ?Homicidal Thoughts:  No  ?Memory:  Immediate;  Good ?Recent;   Good ?Remote;   Good  ?Judgement:  Good  ?Insight:  Good  ?Psychomotor Activity:  Normal  ?Concentration:  Concentration: Good and Attention Span: Good  ?Recall:  Good  ?Fund of Knowledge: Good  ?Language: Good  ?Akathisia:  No  ?Handed:  Right  ?AIMS (if indicated): not done  ?Assets:  Communication Skills ?Desire for Improvement ?Housing ?Social Support  ?ADL's:  Intact  ?Cognition: WNL  ?Sleep:  Good  ? ?Screenings: ?AIMS   ? ?Flowsheet Row [facility]  ?AIMS Total Score 0  ? ?  ? ?CAGE-AID   ? ?Flowsheet Row ED to Hosp-Admission (Discharged) [facility]  ?CAGE-AID Score 0  ? ?  ? ?GAD-7   ? ?Flowsheet Row [facility]  ?Total GAD-7 Score 12 12 15 13 15   ? ?  ? ?PHQ2-9   ? ?Flo

## 2022-01-18 ENCOUNTER — Ambulatory Visit (HOSPITAL_COMMUNITY): Payer: No Payment, Other

## 2022-01-24 ENCOUNTER — Other Ambulatory Visit: Payer: Self-pay

## 2022-01-25 ENCOUNTER — Other Ambulatory Visit: Payer: Self-pay

## 2022-01-25 ENCOUNTER — Telehealth (HOSPITAL_COMMUNITY): Payer: Self-pay | Admitting: *Deleted

## 2022-01-25 ENCOUNTER — Other Ambulatory Visit (HOSPITAL_COMMUNITY): Payer: Self-pay | Admitting: Physician Assistant

## 2022-01-25 DIAGNOSIS — F411 Generalized anxiety disorder: Secondary | ICD-10-CM

## 2022-01-25 DIAGNOSIS — F332 Major depressive disorder, recurrent severe without psychotic features: Secondary | ICD-10-CM

## 2022-01-25 MED ORDER — VENLAFAXINE HCL ER 150 MG PO CP24
150.0000 mg | ORAL_CAPSULE | Freq: Every day | ORAL | 1 refills | Status: DC
Start: 2022-01-25 — End: 2022-02-16
  Filled 2022-01-25 – 2022-02-01 (×2): qty 30, 30d supply, fill #0

## 2022-01-25 NOTE — Progress Notes (Signed)
Provider was contacted by Wynona Luna, RN regarding patient's call informing the practice that he is almost out of his venlafaxine medication.  Provider to refill patient's prescription and e-prescribe to pharmacy of choice.

## 2022-01-25 NOTE — Telephone Encounter (Signed)
Call from patient requesting his Venalfaxine XL 150 mg be sent over to the [facility]. He is nearly out of the 75 mg that he has been doubling up on to make 150. Eddied PAs last note indicates patient is on 150mg . Will notify the provider.

## 2022-01-25 NOTE — Telephone Encounter (Signed)
Provider was contacted by Suzanne K Beck, RN regarding patient's call informing the practice that he is almost out of his venlafaxine medication.  Provider to refill patient's prescription and e-prescribe to pharmacy of choice. 

## 2022-02-01 ENCOUNTER — Ambulatory Visit (HOSPITAL_COMMUNITY): Payer: No Payment, Other | Admitting: *Deleted

## 2022-02-01 ENCOUNTER — Other Ambulatory Visit: Payer: Self-pay

## 2022-02-01 ENCOUNTER — Other Ambulatory Visit (HOSPITAL_COMMUNITY): Payer: Self-pay | Admitting: Physician Assistant

## 2022-02-01 DIAGNOSIS — F251 Schizoaffective disorder, depressive type: Secondary | ICD-10-CM

## 2022-02-01 NOTE — Progress Notes (Signed)
Patient ID: Sean Shepherd, male   DOB: [birth year], 46 y.o.   MRN: [id number] [place] as planned for his monthly  lab work.for his CBC. He brought with him bills for his lab, I will give them to my supervisor to follow thru with as he is indigent. Also, he states he got a jury duty notice and would like to be excused from it. He informed me today he was denied a second time for his disability and plans to follow up with a third try again with the help of [facility]. He hasnt decided if he will use an attorney. He is doing well otherwise. He is to come in tues or wed for his clozaril which comes to this clinice.

## 2022-02-04 LAB — CBC WITH DIFFERENTIAL/PLATELET
Basophils Absolute: 0 10*3/uL (ref 0.0–0.2)
Basos: 1 %
EOS (ABSOLUTE): 0.1 10*3/uL (ref 0.0–0.4)
Eos: 2 %
Hematocrit: 38.9 % (ref 37.5–51.0)
Hemoglobin: 12.7 g/dL — ABNORMAL LOW (ref 13.0–17.7)
Immature Grans (Abs): 0 10*3/uL (ref 0.0–0.1)
Immature Granulocytes: 0 %
Lymphocytes Absolute: 1 10*3/uL (ref 0.7–3.1)
Lymphs: 19 %
MCH: 28.6 pg (ref 26.6–33.0)
MCHC: 32.6 g/dL (ref 31.5–35.7)
MCV: 88 fL (ref 79–97)
Monocytes Absolute: 0.3 10*3/uL (ref 0.1–0.9)
Monocytes: 6 %
Neutrophils Absolute: 3.9 10*3/uL (ref 1.4–7.0)
Neutrophils: 72 %
Platelets: 222 10*3/uL (ref 150–450)
RBC: 4.44 x10E6/uL (ref 4.14–5.80)
RDW: 13.2 % (ref 11.6–15.4)
WBC: 5.3 10*3/uL (ref 3.4–10.8)

## 2022-02-05 ENCOUNTER — Telehealth (HOSPITAL_COMMUNITY): Payer: Self-pay | Admitting: *Deleted

## 2022-02-05 NOTE — Telephone Encounter (Signed)
Submitted new patient assistance form and rx for patients Clozaril as its time for it to be renewed.

## 2022-02-06 ENCOUNTER — Other Ambulatory Visit (HOSPITAL_COMMUNITY): Payer: Self-pay | Admitting: Physician Assistant

## 2022-02-06 DIAGNOSIS — F250 Schizoaffective disorder, bipolar type: Secondary | ICD-10-CM

## 2022-02-06 MED ORDER — CLOZAPINE 100 MG PO TABS
200.0000 mg | ORAL_TABLET | Freq: Every day | ORAL | 11 refills | Status: DC
Start: 2022-02-06 — End: 2022-06-25

## 2022-02-06 NOTE — Progress Notes (Signed)
Patient's medications to be e-prescribed to pharmacy of choice.

## 2022-02-08 ENCOUNTER — Telehealth (HOSPITAL_COMMUNITY): Payer: Self-pay | Admitting: *Deleted

## 2022-02-08 ENCOUNTER — Other Ambulatory Visit (HOSPITAL_COMMUNITY): Payer: Self-pay | Admitting: *Deleted

## 2022-02-08 NOTE — Telephone Encounter (Signed)
Confirmed with [employer] his application and Clozaril rx have been received and they will call me in the next two weeks to set up shipment.

## 2022-02-13 ENCOUNTER — Encounter (HOSPITAL_COMMUNITY): Payer: Self-pay | Admitting: Physician Assistant

## 2022-02-13 ENCOUNTER — Telehealth (HOSPITAL_COMMUNITY): Payer: Self-pay | Admitting: *Deleted

## 2022-02-13 ENCOUNTER — Ambulatory Visit (INDEPENDENT_AMBULATORY_CARE_PROVIDER_SITE_OTHER): Payer: No Payment, Other | Admitting: Physician Assistant

## 2022-02-13 DIAGNOSIS — F250 Schizoaffective disorder, bipolar type: Secondary | ICD-10-CM | POA: Diagnosis not present

## 2022-02-13 DIAGNOSIS — F332 Major depressive disorder, recurrent severe without psychotic features: Secondary | ICD-10-CM | POA: Diagnosis not present

## 2022-02-13 DIAGNOSIS — F411 Generalized anxiety disorder: Secondary | ICD-10-CM | POA: Diagnosis not present

## 2022-02-13 NOTE — Telephone Encounter (Signed)
In for scheduled appt with Sean Shepherd, He brought with him a jury summons he would like to be excused from. Writer filled form out and added a letter to excuse him signed by both Probation officer and Shepherd. Also, discussed any benefits he may have as he was denied disability recently. He has a referral from a peer at [facility] to help him with disability the next time. He currently doesn't receive any assistance and is relying on his savings. Encouraged him to complete food stamp application and gave him the application to complete with help of [facility] or Probation officer if needs help.

## 2022-02-13 NOTE — Progress Notes (Addendum)
BH MD/PA/NP OP Progress Note  02/16/2022 7:46 PM Sean Shepherd  MRN:  [id number]  Chief Complaint:  Chief Complaint  Patient presents with   Medication Management   HPI:   Sean Shepherd is a 46 year old male with a past psychiatric history significant for schizoaffective disorder (bipolar type), generalized anxiety disorder, major depressive disorder who presents to [facility] for follow-up and medication management.  Patient is currently being managed on the following medications:   Clozaril 200 mg at bedtime. Venlafaxine XR (Effexor XR) 150 mg 24-hour capsule daily  Patient denies experiencing any adverse side effects from his current medication management.  Patient states that his Effexor continues to be helpful in managing his anxiety.  He reports still waking up with instances of anxiety and rates his anxiety at 6 out of 10.  Patient's current stressor involves the request for him to participate in jury duty.  Patient also states that his disability has been denied and he has 60 days to a pill.  Patient states that his depression is still present but it has not been as strong as it has been in the past.  Patient's depressive symptoms are characterized by the following: lack of motivation and difficulty getting out of bed.  Patient states that he is still working/volunteering at [employer] and also finds time to interact with family.  A PHQ-9 screen was performed with the patient scoring a 10.  A GAD-7 screen was also performed with the patient scored a 9.  Patient is alert and oriented x4, calm, cooperative, and fully engaged in conversation during the encounter.  Patient endorses being mostly calm but still continues to feel anxiety just below the surface of his calmness.  Patient denies suicidal or homicidal ideations.  He further denies visual hallucinations but states that he experienced auditory hallucinations yesterday.  He described his  auditory hallucinations as sounds being translated into voices.  He states that the medications are mostly effective in helping him to control his auditory hallucinations.  Patient endorses good sleep and receives on average 8 hours of sleep each night.  Patient endorses good appetite and eats on average 3 meals per day.  Patient endorses alcohol consumption sparingly.  Patient denies tobacco use and illicit drug use.  Visit Diagnosis:    ICD-10-CM   1. Severe episode of recurrent major depressive disorder, without psychotic features (HCC)  F33.2 venlafaxine XR (EFFEXOR XR) 150 MG 24 hr capsule    2. Generalized anxiety disorder  F41.1 venlafaxine XR (EFFEXOR XR) 150 MG 24 hr capsule    3. Insomnia, unspecified type  G47.00     4. Schizoaffective disorder, bipolar type (HCC)  F25.0       Past Psychiatric History:  Schizoaffective disorder (bipolar type) Major depressive disorder Insomnia Generalized anxiety disorder  Past Medical History:  Past Medical History:  Diagnosis Date   Anxiety disorder, unspecified 01/10/2021   Bipolar disorder (HCC)    Depression    Schizoaffective disorder (HCC)     Past Surgical History:  Procedure Laterality Date   LAPAROTOMY N/A 12/25/2020   Procedure: EXPLORATORY LAPAROTOMY; REPAIR OF GROIN LACERATIONS X2, RIGHT THIGH LACERATION X 2; REPAIR RIGHT NECK AND LEFT NECK LACERATION AND REPAIR OF LEFT WRIST LACERATION;  Surgeon: [name], MD;  Location: [facility] OR;  Service: General;  Laterality: N/A;   NO PAST SURGERIES      Family Psychiatric History:  Father - Depression, currently taking Zoloft Sister - unsure, hx of trouble with  the law, unsure of med taken  Family History: No family history on file.  Social History:  Social History   Socioeconomic History   Marital status: Single    Spouse name: Not on file   Number of children: 0   Years of education: Not on file   Highest education level: Not on file  Occupational History   Not on  file  Tobacco Use   Smoking status: Former    Types: Cigarettes    Quit date: 07/31/2014    Years since quitting: 7.5   Smokeless tobacco: Never  Vaping Use   Vaping Use: Every day   Substances: THC, CBD  Substance and Sexual Activity   Alcohol use: Not Currently   Drug use: Yes    Types: Marijuana   Sexual activity: Never  Other Topics Concern   Not on file  Social History Narrative   Not on file   Social Determinants of Health   Financial Resource Strain: Not on file  Food Insecurity: Not on file  Transportation Needs: Not on file  Physical Activity: Not on file  Stress: Not on file  Social Connections: Not on file    Allergies: No Known Allergies  Metabolic Disorder Labs: Lab Results  Component Value Date   HGBA1C 5.2 01/09/2021   MPG 102.54 01/09/2021   MPG 117 06/28/2020   No results found for: "PROLACTIN" Lab Results  Component Value Date   CHOL 188 11/18/2020   TRIG 124 11/18/2020   HDL 69 11/18/2020   CHOLHDL 2.7 11/18/2020   VLDL 25 11/18/2020   LDLCALC 94 11/18/2020   LDLCALC 128 (H) 06/28/2020   Lab Results  Component Value Date   TSH 0.828 01/25/2021   TSH 0.817 11/18/2020    Therapeutic Level Labs: No results found for: "LITHIUM" No results found for: "VALPROATE" No results found for: "CBMZ"  Current Medications: Current Outpatient Medications  Medication Sig Dispense Refill   cloZAPine (CLOZARIL) 100 MG tablet Take 2 tablets (200 mg total) by mouth at bedtime. 56 tablet 11   Melatonin 10 MG CAPS Take 10 mg by mouth at bedtime. 30 capsule 1   venlafaxine XR (EFFEXOR XR) 150 MG 24 hr capsule Take 1 capsule (150 mg total) by mouth daily. 30 capsule 1   No current facility-administered medications for this visit.     Musculoskeletal: Strength & Muscle Tone: within normal limits Gait & Station: normal Patient leans: N/A  Psychiatric Specialty Exam: Review of Systems  Psychiatric/Behavioral:  Positive for sleep disturbance.  Negative for decreased concentration, dysphoric mood, hallucinations, self-injury and suicidal ideas. The patient is nervous/anxious. The patient is not hyperactive.     Blood pressure 114/75, pulse 88, height 6\' 1"  (1.854 m), weight 183 lb (83 kg), SpO2 98 %.Body mass index is 24.14 kg/m.  General Appearance: Well Groomed  Eye Contact:  Good  Speech:  Clear and Coherent and Normal Rate  Volume:  Normal  Mood:  Anxious and Depressed  Affect:  Congruent  Thought Process:  Coherent, Goal Directed, and Descriptions of Associations: Intact  Orientation:  Full (Time, Place, and Person)  Thought Content: WDL   Suicidal Thoughts:  No  Homicidal Thoughts:  No  Memory:  Immediate;   Good Recent;   Good Remote;   Good  Judgement:  Good  Insight:  Good  Psychomotor Activity:  Normal  Concentration:  Concentration: Good and Attention Span: Good  Recall:  Good  Fund of Knowledge: Good  Language: Good  Akathisia:  No  Handed:  Right  AIMS (if indicated): not done  Assets:  Communication Skills Desire for Improvement Housing Social Support  ADL's:  Intact  Cognition: WNL  Sleep:  Good   Screenings: AIMS    Flowsheet Row [facility]  AIMS Total Score 0      CAGE-AID    Flowsheet Row ED to Hosp-Admission (Discharged) from 12/[facility]/2020 [facility]  [facility]  CAGE-AID Score 0      GAD-7    Flowsheet Row Office Visit from 02/13/2022 in [facility] Clinical Support from 01/09/2022 in [facility] Clinical Support from 12/05/2021 in [facility] Clinical Support from 10/31/2021 in [facility] Office Visit from 10/03/2021 in [facility]  Total GAD-7 Score 9 12 12 15 13       PHQ2-9    Flowsheet Row Office Visit from 02/13/2022 in [facility] Clinical Support from 01/09/2022 in [facility] Clinical Support from 12/05/2021 in [facility] Clinical Support from 10/31/2021 in [facility] Office Visit from 10/03/2021 in [facility]  PHQ-2 Total Score 4 3 4 4 4   PHQ-9 Total Score 10 10 10 15 10       Flowsheet Row [facility] 02/13/[facility] in [facility] Clinical Support from 01/09/2022 in [facility] Clinical Support from 12/05/2021 in [facility]  C-SSRS RISK CATEGORY Low Risk Low Risk Moderate Risk        Assessment and Plan:   Sean Shepherd is a 46 year old male with a past psychiatric history significant for schizoaffective disorder (bipolar type), generalized anxiety disorder, major depressive disorder who presents to [facility] for follow-up and medication management.  Patient continues to express some anxiety and depression.  Patient attributes his anxiety to the current stressors in his life: [name] duty and his claim for disability being denied.  Patient states that his medications continue to be helpful in managing his psychotic symptoms, depression, and anxiety.  Patient denies the need for dosage adjustments at this time and is requesting refills on all of his medications following the conclusion of the encounter.  Patient's medications to be e-prescribed to pharmacy of choice.  Collaboration of Care: Collaboration of Care: Medication Management AEB for managing patient's psychiatric medications, Psychiatrist AEB patient being followed by mental health provider, and Referral or follow-up with counselor/therapist AEB patient is receiving counseling/the[facility] at [facility] 3 times a week  Patient/Guardian was advised Release of Information must be obtained prior to any  record release in order to collaborate their care with an outside provider. Patient/Guardian was advised if they have not already done so to contact the registration department to sign all necessary forms in order for [facility] to release information regarding their care.   Consent: Patient/Guardian gives verbal consent for treatment and assignment of benefits for services provided during this visit. Patient/Guardian expressed understanding and agreed to proceed.   1. Severe episode of recurrent major depressive disorder, without psychotic features (HCC)  - venlafaxine XR (EFFEXOR XR) 150 MG 24 hr capsule; Take 1 capsule (150 mg total) by mouth daily.  Dispense: 30 capsule; Refill: 1  2. Generalized anxiety disorder  - venlafaxine XR (EFFEXOR XR) 150 MG 24 hr capsule; Take 1  capsule (150 mg total) by mouth daily.  Dispense: 30 capsule; Refill: 1  3. Schizoaffective disorder, bipolar type [facility]) Patient to continue taking clozapine 200 mg at bedtime for the management of his schizoaffective disorder  Patient to follow-up in 4 weeks Provider spent a total of 18 minutes with the patient/reviewing patient's chart  Meta Hatchet, PA 02/16/2022, 7:46 PM

## 2022-02-16 ENCOUNTER — Encounter (HOSPITAL_COMMUNITY): Payer: Self-pay | Admitting: Physician Assistant

## 2022-02-16 MED ORDER — VENLAFAXINE HCL ER 150 MG PO CP24
150.0000 mg | ORAL_CAPSULE | Freq: Every day | ORAL | 1 refills | Status: DC
  Filled 2022-02-16 – 2022-03-12 (×2): qty 30, 30d supply, fill #0

## 2022-02-18 ENCOUNTER — Other Ambulatory Visit: Payer: Self-pay
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
## 2022-03-06 ENCOUNTER — Ambulatory Visit (HOSPITAL_COMMUNITY): Payer: No Payment, Other

## 2022-03-06 ENCOUNTER — Telehealth (HOSPITAL_COMMUNITY): Payer: Self-pay | Admitting: *Deleted

## 2022-03-06 DIAGNOSIS — Z79899 Other long term (current) drug therapy: Secondary | ICD-10-CM

## 2022-03-06 NOTE — Telephone Encounter (Signed)
Opened a second time in error. 

## 2022-03-06 NOTE — Telephone Encounter (Signed)
In for CBC w Diff to be drawn this am due to his taking Clozaril. Drew lab work from [employer] without issue. States he is doing well, recently celebrated his birthday. He is to pick his Clozaril up on Friday the 7th.

## 2022-03-07 LAB — CBC WITH DIFFERENTIAL
Basophils Absolute: 0 10*3/uL (ref 0.0–0.2)
Basos: 1 %
EOS (ABSOLUTE): 0.1 10*3/uL (ref 0.0–0.4)
Eos: 2 %
Hematocrit: 38.8 % (ref 37.5–51.0)
Hemoglobin: 12.5 g/dL — ABNORMAL LOW (ref 13.0–17.7)
Immature Grans (Abs): 0 10*3/uL (ref 0.0–0.1)
Immature Granulocytes: 0 %
Lymphocytes Absolute: 1 10*3/uL (ref 0.7–3.1)
Lymphs: 21 %
MCH: 28.6 pg (ref 26.6–33.0)
MCHC: 32.2 g/dL (ref 31.5–35.7)
MCV: 89 fL (ref 79–97)
Monocytes Absolute: 0.3 10*3/uL (ref 0.1–0.9)
Monocytes: 6 %
Neutrophils Absolute: 3.5 10*3/uL (ref 1.4–7.0)
Neutrophils: 70 %
RBC: 4.37 x10E6/uL (ref 4.14–5.80)
RDW: 13.8 % (ref 11.6–15.4)
WBC: 5 10*3/uL (ref 3.4–10.8)

## 2022-03-07 LAB — SPECIMEN STATUS REPORT

## 2022-03-08 ENCOUNTER — Ambulatory Visit (HOSPITAL_COMMUNITY): Payer: Self-pay

## 2022-03-12 ENCOUNTER — Other Ambulatory Visit: Payer: Self-pay

## 2022-03-14 ENCOUNTER — Ambulatory Visit (INDEPENDENT_AMBULATORY_CARE_PROVIDER_SITE_OTHER): Payer: No Payment, Other | Admitting: Student in an Organized Health Care Education/Training Program

## 2022-03-14 ENCOUNTER — Encounter (HOSPITAL_COMMUNITY): Payer: Self-pay | Admitting: Student in an Organized Health Care Education/Training Program

## 2022-03-14 ENCOUNTER — Other Ambulatory Visit: Payer: Self-pay

## 2022-03-14 DIAGNOSIS — F332 Major depressive disorder, recurrent severe without psychotic features: Secondary | ICD-10-CM

## 2022-03-14 DIAGNOSIS — F250 Schizoaffective disorder, bipolar type: Secondary | ICD-10-CM

## 2022-03-14 DIAGNOSIS — F411 Generalized anxiety disorder: Secondary | ICD-10-CM

## 2022-03-14 MED ORDER — VENLAFAXINE HCL ER 150 MG PO CP24
150.0000 mg | ORAL_CAPSULE | Freq: Every day | ORAL | 1 refills | Status: DC
  Filled 2022-03-14 – 2022-04-10 (×2): qty 30, 30d supply, fill #0

## 2022-03-14 NOTE — Progress Notes (Signed)
BH MD/PA/NP OP Progress Note  03/14/2022 11:41 AM Sean Shepherd  MRN:  [id number]  Chief Complaint: No chief complaint on file.  HPI: Sean Shepherd is a 46 year old male with a past psychiatric history significant for schizoaffective disorder (bipolar type), generalized anxiety disorder, major depressive disorder who presents to [facility] for follow-up and medication management.  Patient is currently being managed on the following medications:    Clozaril 200 mg at bedtime. Venlafaxine XR (Effexor XR) 150 mg 24-hour capsule daily  On assessment today patient is objectively calm and pleasant.  Patient reports that he is doing fairly well.  Patient reports that [facility] a [facility] and is volunteering at [employer] 2 times a week.  Patient reports that next week he will begin volunteering at [employer] 3 times a week.  Patient reports that he enjoys all of his volunteering time and endorses that this time allows him to have other things to focus on.  Patient reports that he still has some anxiety but he feels that is under better control and that participating in other activities also helps.  Patient endorses also using some coping skills such as breathing to help when he is feeling a bit more anxious.  Patient reports that he feels that his sleep is "mostly okay" and denies feeling that he needs any adjustments in his current medication regimen.  Patient denies SI, HI and VH.  Patient reports he continues to have AH and that it is "noises that kind of turned into sounds" and that they are overall negative but he does not listen to them.  Patient reports that he is not much bothered by his auditory hallucinations and reports they are significantly better than about a year and a half ago.  Patient has been attending his lab draws as scheduled.  His absolute neutrophil count is stable at 3.5.  Visit Diagnosis: No  diagnosis found.  Past Psychiatric History: Schizoaffective disorder (bipolar type) Major depressive disorder Insomnia Generalized anxiety disorder  Past Medical History:  Past Medical History:  Diagnosis Date   Anxiety disorder, unspecified 01/10/2021   Bipolar disorder (HCC)    Depression    Schizoaffective disorder (HCC)     Past Surgical History:  Procedure Laterality Date   LAPAROTOMY N/A 12/25/2020   Procedure: EXPLORATORY LAPAROTOMY; REPAIR OF GROIN LACERATIONS X2, RIGHT THIGH LACERATION X 2; REPAIR RIGHT NECK AND LEFT NECK LACERATION AND REPAIR OF LEFT WRIST LACERATION;  Surgeon: [name], MD;  Location: [facility] OR;  Service: General;  Laterality: N/A;   NO PAST SURGERIES      Family Psychiatric History: Father - Depression, currently taking Zoloft Sister - unsure, hx of trouble with the law, unsure of med taken    Family History: No family history on file.  Social History:  Social History   Socioeconomic History   Marital status: Single    Spouse name: Not on file   Number of children: 0   Years of education: Not on file   Highest education level: Not on file  Occupational History   Not on file  Tobacco Use   Smoking status: Former    Types: Cigarettes    Quit date: 07/31/2014    Years since quitting: 7.6   Smokeless tobacco: Never  Vaping Use   Vaping Use: Every day   Substances: THC, CBD  Substance and Sexual Activity   Alcohol use: Not Currently   Drug  use: Yes    Types: Marijuana   Sexual activity: Never  Other Topics Concern   Not on file  Social History Narrative   Not on file   Social Determinants of Health   Financial Resource Strain: Not on file  Food Insecurity: Not on file  Transportation Needs: Not on file  Physical Activity: Not on file  Stress: Not on file  Social Connections: Not on file    Allergies: No Known Allergies  Metabolic Disorder Labs: Lab Results  Component Value Date   HGBA1C 5.2 01/09/2021   MPG 102.54  01/09/2021   MPG 117 06/28/2020   No results found for: "PROLACTIN" Lab Results  Component Value Date   CHOL 188 11/18/2020   TRIG 124 11/18/2020   HDL 69 11/18/2020   CHOLHDL 2.7 11/18/2020   VLDL 25 11/18/2020   LDLCALC 94 11/18/2020   LDLCALC 128 (H) 06/28/2020   Lab Results  Component Value Date   TSH 0.828 01/25/2021   TSH 0.817 11/18/2020    Therapeutic Level Labs: No results found for: "LITHIUM" No results found for: "VALPROATE" No results found for: "CBMZ"  Current Medications: Current Outpatient Medications  Medication Sig Dispense Refill   cloZAPine (CLOZARIL) 100 MG tablet Take 2 tablets (200 mg total) by mouth at bedtime. 56 tablet 11   Melatonin 10 MG CAPS Take 10 mg by mouth at bedtime. 30 capsule 1   venlafaxine XR (EFFEXOR XR) 150 MG 24 hr capsule Take 1 capsule (150 mg total) by mouth daily. 30 capsule 1   No current facility-administered medications for this visit.     Musculoskeletal: Strength & Muscle Tone: within normal limits Gait & Station: normal Patient leans: N/A  Psychiatric Specialty Exam: Review of Systems  Cardiovascular:  Negative for chest pain.  Gastrointestinal:  Negative for constipation.  Psychiatric/Behavioral:  Positive for hallucinations. Negative for agitation, behavioral problems, dysphoric mood and suicidal ideas.     There were no vitals taken for this visit.There is no height or weight on file to calculate BMI.  General Appearance: Casual and Well Groomed  Eye Contact:  Good  Speech:  Clear and Coherent a bit of a small slightly noticeable stutter  Volume:  Normal  Mood:  Euthymic  Affect:  Flat  Thought Process:  Linear  Orientation:  Full (Time, Place, and Person)  Thought Content: Logical   Suicidal Thoughts:  No  Homicidal Thoughts:  No  Memory:  Remote;   Good  Judgement:  Fair  Insight:  Fair  Psychomotor Activity:  Normal  Concentration:  Concentration: Fair  Recall:  NA  Fund of Knowledge: Fair   Language: Good  Akathisia:  No  Handed:  AIMS (if indicated): not done  Assets:  Communication Skills Desire for Improvement Housing Resilience Social Support  ADL's:  Intact  Cognition: WNL  Sleep:  Fair   Screenings: AIMS    Flowsheet Row [facility]  AIMS Total Score 0      CAGE-AID    Flowsheet Row ED to Hosp-Admission (Discharged) from 12/25/2020 in [facility]  SURGICAL  CAGE-AID Score 0      GAD-7    Flowsheet Row Office Visit from 02/13/2022 in [facility] Clinical Support from 01/09/2022 in [facility] Clinical Support from 12/05/2021 in [facility] Clinical Support from 10/31/2021 in [facility] Office Visit from 10/03/2021 in  [facility]  Total GAD-7 Score 9 12 12 15 13       PHQ2-9    Flowsheet Row Office Visit from 02/13/2022 in [facility] Clinical Support from 01/09/2022 in [facility] Clinical Support from 12/05/2021 in [facility] Clinical Support from 10/31/2021 in [facility] Office Visit from 10/03/2021 in [facility]  PHQ-2 Total Score 4 3 4 4 4   PHQ-9 Total Score 10 10 10 15 10       Flowsheet Row Office Visit from 02/13/2022 in [facility] Clinical Support from 01/09/2022 in [facility] Clinical Support from 12/05/2021 in [facility]  C-SSRS RISK CATEGORY Low Risk Low Risk Moderate Risk        Assessment and Plan: Patient appears to continue to be stable on current medication regimen.  Patient endorses that the Effexor does help alleviate some of his anxiety and he is able to function on a  day-to-day.  Patient is also practicing coping skills to help with some of his anxiety.   1. Severe episode of recurrent major depressive disorder, without psychotic features (HCC)   - venlafaxine XR (EFFEXOR XR) 150 MG 24 hr capsule; Take 1 capsule (150 mg total) by mouth daily.  Dispense: 30 capsule; Refill: 1   2. Generalized anxiety disorder   - venlafaxine XR (EFFEXOR XR) 150 MG 24 hr capsule; Take 1 capsule (150 mg total) by mouth daily.  Dispense: 30 capsule; Refill: 1   3. Schizoaffective disorder, bipolar type (HCC) Patient to continue taking clozapine 200 mg at bedtime for the management of his schizoaffective disorder   Patient to follow-up in 4 weeks Provider spent a total of 18 minutes with the patient/reviewing patient's chart - Update 04/2022 for CBC  Collaboration of Care: Collaboration of Care:  Patient/Guardian was advised Release of Information must be obtained prior to any record release in order to collaborate their care with an outside provider. Patient/Guardian was advised if they have not already done so to contact the registration department to sign all necessary forms in order for 02/04/2022 to release information regarding their care.   Consent: Patient/Guardian gives verbal consent for treatment and assignment of benefits for services provided during this visit. Patient/Guardian expressed understanding and agreed to proceed.   [facility]3 [facility], MD 03/14/2022, 11:41 AM

## 2022-04-05 ENCOUNTER — Ambulatory Visit (HOSPITAL_COMMUNITY): Payer: No Payment, Other | Admitting: *Deleted

## 2022-04-05 ENCOUNTER — Other Ambulatory Visit (HOSPITAL_COMMUNITY): Payer: Self-pay | Admitting: Physician Assistant

## 2022-04-05 DIAGNOSIS — F250 Schizoaffective disorder, bipolar type: Secondary | ICD-10-CM

## 2022-04-05 NOTE — Progress Notes (Signed)
In as scheduled for his monthly lab for CBC w Diff related to his taking Clozaril. He is doing well, offers no complaints. He is vol at [employer] three times a week now. States anxiety is under control. CBC drawn from his R ARM without issue.

## 2022-04-08 LAB — CBC WITH DIFFERENTIAL
Basophils Absolute: 0 10*3/uL (ref 0.0–0.2)
Basos: 0 %
EOS (ABSOLUTE): 0.1 10*3/uL (ref 0.0–0.4)
Eos: 2 %
Hematocrit: 38.1 % (ref 37.5–51.0)
Hemoglobin: 12.4 g/dL — ABNORMAL LOW (ref 13.0–17.7)
Immature Grans (Abs): 0 10*3/uL (ref 0.0–0.1)
Immature Granulocytes: 0 %
Lymphocytes Absolute: 1 10*3/uL (ref 0.7–3.1)
Lymphs: 19 %
MCH: 29 pg (ref 26.6–33.0)
MCHC: 32.5 g/dL (ref 31.5–35.7)
MCV: 89 fL (ref 79–97)
Monocytes Absolute: 0.3 10*3/uL (ref 0.1–0.9)
Monocytes: 6 %
Neutrophils Absolute: 3.6 10*3/uL (ref 1.4–7.0)
Neutrophils: 73 %
RBC: 4.27 x10E6/uL (ref 4.14–5.80)
RDW: 14 % (ref 11.6–15.4)
WBC: 5 10*3/uL (ref 3.4–10.8)

## 2022-04-10 ENCOUNTER — Other Ambulatory Visit: Payer: Self-pay

## 2022-04-30 ENCOUNTER — Ambulatory Visit (INDEPENDENT_AMBULATORY_CARE_PROVIDER_SITE_OTHER): Payer: No Payment, Other | Admitting: Family

## 2022-04-30 ENCOUNTER — Other Ambulatory Visit: Payer: Self-pay

## 2022-04-30 DIAGNOSIS — F332 Major depressive disorder, recurrent severe without psychotic features: Secondary | ICD-10-CM

## 2022-04-30 DIAGNOSIS — F411 Generalized anxiety disorder: Secondary | ICD-10-CM | POA: Diagnosis not present

## 2022-04-30 MED ORDER — VENLAFAXINE HCL ER 75 MG PO CP24
75.0000 mg | ORAL_CAPSULE | Freq: Every day | ORAL | 0 refills | Status: DC
  Filled 2022-04-30: qty 30, 30d supply, fill #0

## 2022-04-30 NOTE — Progress Notes (Deleted)
Virtual Visit via Telephone Note  I connected with Sean Shepherd on 04/30/22 at 10:00 AM EDT by telephone and verified that I am speaking with the correct person using two identifiers.  Location: Patient: Home Provider: Office  I discussed the limitations, risks, security and privacy concerns of performing an evaluation and management service by telephone and the availability of in person appointments. I also discussed with the patient that there may be a patient responsible charge related to this service. The patient expressed understanding and agreed to proceed.   I discussed the assessment and treatment plan with the patient. The patient was provided an opportunity to ask questions and all were answered. The patient agreed with the plan and demonstrated an understanding of the instructions.   The patient was advised to call back or seek an in-person evaluation if the symptoms worsen or if the condition fails to improve as anticipated.  I provided 15 minutes of non-face-to-face time during this encounter.   Derrill Center, NP   [facility] MD/PA/NP OP Progress Note  04/30/2022 11:41 AM Sean Shepherd  MRN:  [id number]  Chief Complaint: Sean Shepherd reported " I want to stop taking Effexor." HPI: Sean Shepherd 46 year old male was seen and evaluated telephonically.  Patient has a charted history with anxiety disorder, bipolar disorder, major depressive disorder, schizoaffective disorder.  He is currently prescribed Effexor 150 mg and Clozaril 200mg  nighly  He reports feeling sluggish throughout the day which she attributes to the recent increase to the Effexor.  States he would like to be titrated off of this medication completely.  Denied any other symptoms related to medications.  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  He reports a good appetite.  States he is resting well throughout the night.  Medication  adjustments:  Decreased Effexor 150 mg daily to 75 mg daily patient to follow-up 4 weeks for efficacy consider ongoing downward taper. Continue  Clozaril 200 mg for mood stabilization  During evaluation Sean Shepherd did not sound to be in any  acute distress. He is alert/oriented x 3; calm/cooperative; and is speaking in a clear tone at moderate volume, and normal pace;  his  thought process is coherent and relevant;  He reported intermittent auditory hallucination. he is denied that he is experiencing  delusional thought content; and he has denied suicidal/self-harm/homicidal ideation, psychosis, and paranoia.   Patient has remained calm throughout assessment and has answered questions appropriately.         Visit Diagnosis:    ICD-10-CM   1. Severe episode of recurrent major depressive disorder, without psychotic features (HCC)  F33.2 venlafaxine XR (EFFEXOR XR) 75 MG 24 hr capsule    2. Generalized anxiety disorder  F41.1 venlafaxine XR (EFFEXOR XR) 75 MG 24 hr capsule      Past Psychiatric History:   Past Medical History:  Past Medical History:  Diagnosis Date   Anxiety disorder, unspecified 01/10/2021   Bipolar disorder ([place])    Depression    Schizoaffective disorder ([place])     Past Surgical History:  Procedure Laterality Date   LAPAROTOMY N/A 12/25/2020   Procedure: EXPLORATORY LAPAROTOMY; REPAIR OF GROIN LACERATIONS X2, RIGHT THIGH LACERATION X 2; REPAIR RIGHT NECK AND LEFT NECK LACERATION AND REPAIR OF LEFT WRIST LACERATION;  Surgeon: Georganna Skeans, MD;  Location: [place];  Service: General;  Laterality: N/A;   NO PAST SURGERIES      Family Psychiatric History:   Family  History: No family history on file.  Social History:  Social History   Socioeconomic History   Marital status: Single    Spouse name: Not on file   Number of children: 0   Years of education: Not on file   Highest education level: Not on file  Occupational History   Not on file  Tobacco Use    Smoking status: Former    Types: Cigarettes    Quit date: 07/31/2014    Years since quitting: 7.7   Smokeless tobacco: Never  Vaping Use   Vaping Use: Every day   Substances: THC, CBD  Substance and Sexual Activity   Alcohol use: Not Currently   Drug use: Yes    Types: Marijuana   Sexual activity: Never  Other Topics Concern   Not on file  Social History Narrative   Not on file   Social Determinants of Health   Financial Resource Strain: Not on file  Food Insecurity: Not on file  Transportation Needs: Not on file  Physical Activity: Not on file  Stress: Not on file  Social Connections: Not on file    Allergies: No Known Allergies  Metabolic Disorder Labs: Lab Results  Component Value Date   HGBA1C 5.2 01/09/2021   MPG 102.54 01/09/2021   MPG 117 06/28/2020   No results found for: "PROLACTIN" Lab Results  Component Value Date   CHOL 188 11/18/2020   TRIG 124 11/18/2020   HDL 69 11/18/2020   CHOLHDL 2.7 11/18/2020   VLDL 25 11/18/2020   LDLCALC 94 11/18/2020   LDLCALC 128 (H) 06/28/2020   Lab Results  Component Value Date   TSH 0.828 01/25/2021   TSH 0.817 11/18/2020    Therapeutic Level Labs: No results found for: "LITHIUM" No results found for: "VALPROATE" No results found for: "CBMZ"  Current Medications: Current Outpatient Medications  Medication Sig Dispense Refill   cloZAPine (CLOZARIL) 100 MG tablet Take 2 tablets (200 mg total) by mouth at bedtime. 56 tablet 11   Melatonin 10 MG CAPS Take 10 mg by mouth at bedtime. 30 capsule 1   venlafaxine XR (EFFEXOR XR) 75 MG 24 hr capsule Take 1 capsule (75 mg total) by mouth daily. 30 capsule 0   No current facility-administered medications for this visit.     Musculoskeletal:   Psychiatric Specialty Exam: Review of Systems  Psychiatric/Behavioral:  Positive for decreased concentration. Negative for sleep disturbance and suicidal ideas. Hallucinations: intermittetn voice/ denied voices are  command in nature.The patient is nervous/anxious.   All other systems reviewed and are negative.   There were no vitals taken for this visit.There is no height or weight on file to calculate BMI.  General Appearance: NA  Eye Contact:  NA  Speech:  Clear and Coherent  Volume:  Normal  Mood:  NA  Affect:  NA  Thought Process:  Coherent  Orientation:  Full (Time, Place, and Person)  Thought Content: Logical   Suicidal Thoughts:  No  Homicidal Thoughts:  No  Memory:  Immediate;   Fair Recent;   Fair  Judgement:  Good  Insight:  Fair  Psychomotor Activity:  Normal  Concentration:  Concentration: Good  Recall:  Good  Fund of Knowledge: Good  Language: Good  Akathisia: N/A  Handed:  Right  AIMS (if indicated): not done  Assets:  Desire for Improvement Resilience Social Support  ADL's:  Intact  Cognition: WNL  Sleep:  Good   Screenings: AIMS    Flowsheet Row [facility]  AIMS Total Score 0      CAGE-AID    Flowsheet Row ED to Hosp-Admission (Discharged) from 12/25/2020 in [facility]  SURGICAL  CAGE-AID Score 0      GAD-7    Flowsheet Row Office Visit from 02/13/2022 in [facility] Clinical Support from 01/09/2022 in [facility] Clinical Support from 12/05/2021 in [facility] Clinical Support from 10/31/2021 in [facility] Office Visit from 10/03/2021 in [facility]  Total GAD-7 Score 9 12 12 15 13       PHQ2-9    Flowsheet Row Office Visit from 02/13/2022 in [facility] Clinical Support from 01/09/2022 in [facility] Clinical Support from 12/05/2021 in [facility] Clinical Support from 10/31/2021 in [facility] Office Visit  from 10/03/2021 in [facility]  PHQ-2 Total Score 4 3 4 4 4   PHQ-9 Total Score 10 10 10 15 10       Flowsheet Row Office Visit from 02/13/2022 in [facility] Clinical Support from 01/09/2022 in [facility] Clinical Support from 12/05/2021 in [facility]  C-SSRS RISK CATEGORY Low Risk Low Risk Moderate Risk        Assessment and Plan: Geovannie Vilar 46 year old male seen and evaluated telephonically.  Overall he reports his mood as been stable with medication.  Does report feeling tired throughout the day which she attributes to the increased dose of Effexor.  And would like to titrate off of this medication.  Discussed medication taper.  He was receptive to plan.  Continues to deny suicidal or homicidal ideations.  Denied auditory visual hallucinations.  Reports he has been filling his time by volunteering at the [employer] he denied any other concerns at this time.  Does report intermittent auditory hallucinations but have been minimized since he has been compliant with medications.  Patient to follow-up 4 weeks for medication management.  Collaboration of Care: Collaboration of Care: Medication Management AEB patient to start Effexor Exar 75 mg daily, discontinued Effexor 150 mg  Patient/Guardian was advised Release of Information must be obtained prior to any record release in order to collaborate their care with an outside provider. Patient/Guardian was advised if they have not already done so to contact the registration department to sign all necessary forms in order for [name] to release information regarding their care.   Consent: Patient/Guardian gives verbal consent for treatment and assignment of benefits for services provided during this visit. Patient/Guardian expressed understanding and agreed to proceed.    56, NP 04/30/2022, 11:41 AM

## 2022-04-30 NOTE — Plan of Care (Unsigned)
Virtual Visit via Telephone Note  I connected with Sean Shepherd on 04/30/22 at 10:00 AM EDT by telephone and verified that I am speaking with the correct person using two identifiers.  Location: Patient: Home Provider: Office   I discussed the limitations, risks, security and privacy concerns of performing an evaluation and management service by telephone and the availability of in person appointments. I also discussed with the patient that there may be a patient responsible charge related to this service. The patient expressed understanding and agreed to proceed.    I discussed the assessment and treatment plan with the patient. The patient was provided an opportunity to ask questions and all were answered. The patient agreed with the plan and demonstrated an understanding of the instructions.   The patient was advised to call back or seek an in-person evaluation if the symptoms worsen or if the condition fails to improve as anticipated.  I provided 15 minutes of non-face-to-face time during this encounter.   Sean Rack, NP   [facility] MD/PA/NP OP Progress Note  04/30/2022 10:25 AM Sean Shepherd  MRN:  [id number]  Chief Complaint:  Sean Shepherd reported " I want to stop my Effexor"    HPI: Sean Shepherd    Visit Diagnosis:    ICD-10-CM   1. Severe episode of recurrent major depressive disorder, without psychotic features (HCC)  F33.2 venlafaxine XR (EFFEXOR XR) 75 MG 24 hr capsule    2. Generalized anxiety disorder  F41.1 venlafaxine XR (EFFEXOR XR) 75 MG 24 hr capsule      Past Psychiatric History:   Past Medical History:  Past Medical History:  Diagnosis Date   Anxiety disorder, unspecified 01/10/2021   Bipolar disorder (HCC)    Depression    Schizoaffective disorder (HCC)     Past Surgical History:  Procedure Laterality Date   LAPAROTOMY N/A 12/25/2020   Procedure: EXPLORATORY LAPAROTOMY; REPAIR OF GROIN LACERATIONS X2, RIGHT THIGH LACERATION X 2; REPAIR RIGHT  NECK AND LEFT NECK LACERATION AND REPAIR OF LEFT WRIST LACERATION;  Surgeon: [name], MD;  Location: [facility] OR;  Service: General;  Laterality: N/A;   NO PAST SURGERIES      Family Psychiatric History:   Family History: No family history on file.  Social History:  Social History   Socioeconomic History   Marital status: Single    Spouse name: Not on file   Number of children: 0   Years of education: Not on file   Highest education level: Not on file  Occupational History   Not on file  Tobacco Use   Smoking status: Former    Types: Cigarettes    Quit date: 07/31/2014    Years since quitting: 7.7   Smokeless tobacco: Never  Vaping Use   Vaping Use: Every day   Substances: THC, CBD  Substance and Sexual Activity   Alcohol use: Not Currently   Drug use: Yes    Types: Marijuana   Sexual activity: Never  Other Topics Concern   Not on file  Social History Narrative   Not on file   Social Determinants of Health   Financial Resource Strain: Not on file  Food Insecurity: Not on file  Transportation Needs: Not on file  Physical Activity: Not on file  Stress: Not on file  Social Connections: Not on file    Allergies: No Known Allergies  Metabolic Disorder Labs: Lab Results  Component Value Date   HGBA1C 5.2 01/09/2021   MPG 102.54 01/09/2021   MPG  117 06/28/2020   No results found for: "PROLACTIN" Lab Results  Component Value Date   CHOL 188 11/18/2020   TRIG 124 11/18/2020   HDL 69 11/18/2020   CHOLHDL 2.7 11/18/2020   VLDL 25 11/18/2020   LDLCALC 94 11/18/2020   LDLCALC 128 (H) 06/28/2020   Lab Results  Component Value Date   TSH 0.828 01/25/2021   TSH 0.817 11/18/2020    Therapeutic Level Labs: No results found for: "LITHIUM" No results found for: "VALPROATE" No results found for: "CBMZ"  Current Medications: Current Outpatient Medications  Medication Sig Dispense Refill   cloZAPine (CLOZARIL) 100 MG tablet Take 2 tablets (200 mg total) by  mouth at bedtime. 56 tablet 11   Melatonin 10 MG CAPS Take 10 mg by mouth at bedtime. 30 capsule 1   venlafaxine XR (EFFEXOR XR) 75 MG 24 hr capsule Take 1 capsule (75 mg total) by mouth daily. 30 capsule 0   No current facility-administered medications for this visit.     Musculoskeletal: Strength & Muscle Tone: within normal limits Gait & Station: normal Patient leans: N/A  Psychiatric Specialty Exam: Review of Systems  There were no vitals taken for this visit.There is no height or weight on file to calculate BMI.  General Appearance: Casual  Eye Contact:  Good  Speech:  Clear and Coherent  Volume:  Normal  Mood:  Anxious and Depressed  Affect:  Congruent  Thought Process:  Coherent  Orientation:  Full (Time, Place, and Person)  Thought Content: Logical   Suicidal Thoughts:  No  Homicidal Thoughts:  No  Memory:  Immediate;   Good Recent;   Good  Judgement:  Good  Insight:  Good  Psychomotor Activity:  Normal  Concentration:  Concentration: Good  Recall:  Good  Fund of Knowledge: Good  Language: Good  Akathisia:  NA  Handed:  Right  AIMS (if indicated): done  Assets:  Communication Skills Desire for Improvement Social Support  ADL's:  Intact  Cognition: WNL  Sleep:  Fair   Screenings: AIMS    Flowsheet Row [facility]  AIMS Total Score 0      CAGE-AID    Flowsheet Row ED to Hosp-Admission (Discharged) from 12/25/2020 in [facility] 6 [facility]  SURGICAL  CAGE-AID Score 0      GAD-7    Flowsheet Row Office Visit from 02/13/2022 in [facility] Clinical Support from 01/09/2022 in [facility] Clinical Support from 12/05/2021 in [facility] Clinical Support from 10/31/2021 in [facility] Office Visit from 10/03/2021 in [facility]   Total GAD-7 Score 9 12 12 15 13       PHQ2-9    Flowsheet Row Office Visit from 02/13/2022 in [facility] Clinical Support from 01/09/2022 in [facility] Clinical Support from 12/05/2021 in [facility] Clinical Support from 10/31/2021 in [facility] Office Visit from 10/03/2021 in [facility]  PHQ-2 Total Score 4 3 4 4 4   PHQ-9 Total Score 10 10 10 15 10       Flowsheet Row Office Visit from 02/13/2022 in [facility] Clinical Support from 01/09/2022 in [facility] Clinical Support from 12/05/2021 in [facility]  C-SSRS RISK CATEGORY Low Risk Low Risk Moderate Risk  Assessment and Plan: Sean Shepherd   Collaboration of Care: Collaboration of Care: [facility] OP Collaboration of [id number]}  Patient/Guardian was advised Release of Information must be obtained prior to any record release in order to collaborate their care with an outside provider. Patient/Guardian was advised if they have not already done so to contact the registration department to sign all necessary forms in order for [place] to release information regarding their care.   Consent: Patient/Guardian gives verbal consent for treatment and assignment of benefits for services provided during this visit. Patient/Guardian expressed understanding and agreed to proceed.    Sean Rack, NP 04/30/2022, 10:25 AM

## 2022-04-30 NOTE — Plan of Care (Cosign Needed)
errorr

## 2022-04-30 NOTE — Progress Notes (Unsigned)
Virtual Visit via Telephone Note  I connected with Sean Shepherd on 04/30/22 at 10:00 AM EDT by telephone and verified that I am speaking with the correct person using two identifiers.  Location: Patient: Home Provider: Office  I discussed the limitations, risks, security and privacy concerns of performing an evaluation and management service by telephone and the availability of in person appointments. I also discussed with the patient that there may be a patient responsible charge related to this service. The patient expressed understanding and agreed to proceed.   I discussed the assessment and treatment plan with the patient. The patient was provided an opportunity to ask questions and all were answered. The patient agreed with the plan and demonstrated an understanding of the instructions.   The patient was advised to call back or seek an in-person evaluation if the symptoms worsen or if the condition fails to improve as anticipated.  I provided 15 minutes of non-face-to-face time during this encounter.   Sean Rack, NP    [facility] MD/PA/NP OP Progress Note  04/30/2022 11:45 AM Sean Shepherd  MRN:  [id number]  Chief Complaint: Sean Shepherd reported " I want to stop taking Effexor."   HPI: Sean Shepherd 46 year old male was seen and evaluated telephonically.  Patient has a charted history with anxiety disorder, bipolar disorder, major depressive disorder, schizoaffective disorder.  He is currently prescribed Effexor 150 mg and Clozaril 200mg  nighly  He reports feeling sluggish throughout the day which she attributes to the recent increase to the Effexor.  States he would like to be titrated off of this medication completely.  Denied any other symptoms related to medications.  States he is currently a at the [employer] where he is working Monday Wednesdays and Fridays.  He reports a good appetite.  States he is resting well throughout the night.  Medication  adjustments:  Decreased Effexor 150 mg daily to 75 mg daily patient to follow-up 4 weeks for efficacy consider ongoing downward taper. Continue  Clozaril 200 mg for mood stabilization  During evaluation Sean Shepherd did not sound to be in any  acute distress. He is alert/oriented x 3; calm/cooperative; and is speaking in a clear tone at moderate volume, and normal pace;  his  thought process is coherent and relevant;  He reported intermittent auditory hallucination. he is denied that he is experiencing  delusional thought content; and he has denied suicidal/self-harm/homicidal ideation, psychosis, and paranoia.   Patient has remained calm throughout assessment and has answered questions appropriately.    Visit Diagnosis:    ICD-10-CM   1. Severe episode of recurrent major depressive disorder, without psychotic features (HCC)  F33.2 venlafaxine XR (EFFEXOR XR) 75 MG 24 hr capsule    2. Generalized anxiety disorder  F41.1 venlafaxine XR (EFFEXOR XR) 75 MG 24 hr capsule      Past Psychiatric History:   Past Medical History:  Past Medical History:  Diagnosis Date   Anxiety disorder, unspecified 01/10/2021   Bipolar disorder (HCC)    Depression    Schizoaffective disorder (HCC)     Past Surgical History:  Procedure Laterality Date   LAPAROTOMY N/A 12/25/2020   Procedure: EXPLORATORY LAPAROTOMY; REPAIR OF GROIN LACERATIONS X2, RIGHT THIGH LACERATION X 2; REPAIR RIGHT NECK AND LEFT NECK LACERATION AND REPAIR OF LEFT WRIST LACERATION;  Surgeon: 12/27/2020, MD;  Location: [facility] OR;  Service: General;  Laterality: N/A;   NO PAST SURGERIES      Family Psychiatric History:   Family History: No  family history on file.  Social History:  Social History   Socioeconomic History   Marital status: Single    Spouse name: Not on file   Number of children: 0   Years of education: Not on file   Highest education level: Not on file  Occupational History   Not on file  Tobacco Use    Smoking status: Former    Types: Cigarettes    Quit date: 07/31/2014    Years since quitting: 7.7   Smokeless tobacco: Never  Vaping Use   Vaping Use: Every day   Substances: THC, CBD  Substance and Sexual Activity   Alcohol use: Not Currently   Drug use: Yes    Types: Marijuana   Sexual activity: Never  Other Topics Concern   Not on file  Social History Narrative   Not on file   Social Determinants of Health   Financial Resource Strain: Not on file  Food Insecurity: Not on file  Transportation Needs: Not on file  Physical Activity: Not on file  Stress: Not on file  Social Connections: Not on file    Allergies: No Known Allergies  Metabolic Disorder Labs: Lab Results  Component Value Date   HGBA1C 5.2 01/09/2021   MPG 102.54 01/09/2021   MPG 117 06/28/2020   No results found for: "PROLACTIN" Lab Results  Component Value Date   CHOL 188 11/18/2020   TRIG 124 11/18/2020   HDL 69 11/18/2020   CHOLHDL 2.7 11/18/2020   VLDL 25 11/18/2020   LDLCALC 94 11/18/2020   LDLCALC 128 (H) 06/28/2020   Lab Results  Component Value Date   TSH 0.828 01/25/2021   TSH 0.817 11/18/2020    Therapeutic Level Labs: No results found for: "LITHIUM" No results found for: "VALPROATE" No results found for: "CBMZ"  Current Medications: Current Outpatient Medications  Medication Sig Dispense Refill   cloZAPine (CLOZARIL) 100 MG tablet Take 2 tablets (200 mg total) by mouth at bedtime. 56 tablet 11   Melatonin 10 MG CAPS Take 10 mg by mouth at bedtime. 30 capsule 1   venlafaxine XR (EFFEXOR XR) 75 MG 24 hr capsule Take 1 capsule (75 mg total) by mouth daily. 30 capsule 0   No current facility-administered medications for this visit.     Musculoskeletal:   Psychiatric Specialty Exam: Review of Systems  Psychiatric/Behavioral:  Positive for decreased concentration. Negative for sleep disturbance and suicidal ideas. Hallucinations: intermittetn voice/ denied voices are command  in nature.The patient is nervous/anxious.   All other systems reviewed and are negative.   There were no vitals taken for this visit.There is no height or weight on file to calculate BMI.  General Appearance: NA  Eye Contact:  NA  Speech:  Clear and Coherent  Volume:  Normal  Mood:  NA  Affect:  NA  Thought Process:  Coherent  Orientation:  Full (Time, Place, and Person)  Thought Content: Logical   Suicidal Thoughts:  No  Homicidal Thoughts:  No  Memory:  Immediate;   Fair Recent;   Fair  Judgement:  Good  Insight:  Fair  Psychomotor Activity:  Normal  Concentration:  Concentration: Good  Recall:  Good  Fund of Knowledge: Good  Language: Good  Akathisia: N/A  Handed:  Right  AIMS (if indicated): not done  Assets:  Desire for Improvement Resilience Social Support  ADL's:  Intact  Cognition: WNL  Sleep:  Good   Screenings: AIMS    Flowsheet Row [facility]  AIMS Total Score 0      CAGE-AID    Flowsheet Row ED to Hosp-Admission (Discharged) from 12/25/2020 in [facility]  SURGICAL  CAGE-AID Score 0      GAD-7    Flowsheet Row Office Visit from 02/13/2022 in [facility] Clinical Support from 01/09/2022 in [facility] Clinical Support from 12/05/2021 in [facility] Clinical Support from 10/31/2021 in [facility] Office Visit from 10/03/2021 in [facility]  Total GAD-7 Score 9 12 12 15 13       PHQ2-9    Flowsheet Row Office Visit from 02/13/2022 in [facility] Clinical Support from 01/09/2022 in [facility] Clinical Support from 12/05/2021 in [facility] Clinical Support from 10/31/2021 in [facility] Office Visit from  10/03/2021 in [facility]  PHQ-2 Total Score 4 3 4 4 4   PHQ-9 Total Score 10 10 10 15 10       Flowsheet Row Office Visit from 02/13/2022 in [facility] Clinical Support from 01/09/2022 in [facility] Clinical Support from 12/05/2021 in [facility]  C-SSRS RISK CATEGORY Low Risk Low Risk Moderate Risk        Assessment and Plan: Devesh Monforte 46 year old male seen and evaluated telephonically.  Overall he reports his mood as been stable with medication.  Does report feeling tired throughout the day which she attributes to the increased dose of Effexor.  And would like to titrate off of this medication.  Discussed medication taper.  He was receptive to plan.  Continues to deny suicidal or homicidal ideations.  Denied auditory visual hallucinations.  Reports he has been filling his time by volunteering at the [employer] he denied any other concerns at this time.  Does report intermittent auditory hallucinations but have been minimized since he has been compliant with medications.  Patient to follow-up 4 weeks for medication management.  Collaboration of Care: Collaboration of Care: Medication Management AEB patient to start Effexor Exar 75 mg daily, discontinued Effexor 150 mg  Patient/Guardian was advised Release of Information must be obtained prior to any record release in order to collaborate their care with an outside provider. Patient/Guardian was advised if they have not already done so to contact the registration department to sign all necessary forms in order for Sean Shepherd to release information regarding their care.   Consent: Patient/Guardian gives verbal consent for treatment and assignment of benefits for services provided during this visit. Patient/Guardian expressed understanding and agreed to proceed.    56, NP 04/30/2022, 11:45 AM

## 2022-05-01 NOTE — Progress Notes (Signed)
Virtual Visit via Telephone Note   I connected with Sean Shepherd on 04/30/22 at 10:00 AM EDT by telephone and verified that I am speaking with the correct person using two identifiers.   Location: Patient: Home Provider: Office   I discussed the limitations, risks, security and privacy concerns of performing an evaluation and management service by telephone and the availability of in person appointments. I also discussed with the patient that there may be a patient responsible charge related to this service. The patient expressed understanding and agreed to proceed.   I discussed the assessment and treatment plan with the patient. The patient was provided an opportunity to ask questions and all were answered. The patient agreed with the plan and demonstrated an understanding of the instructions.   The patient was advised to call back or seek an in-person evaluation if the symptoms worsen or if the condition fails to improve as anticipated.   I provided 15 minutes of non-face-to-face time during this encounter.     [name], NP        [facility] MD/PA/NP OP Progress Note   04/30/2022 11:45 AM Sean Shepherd  MRN:  [id number]   Chief Complaint: [name] reported " I want to stop taking Effexor."     HPI: Sean Shepherd 46 year old male was seen and evaluated telephonically.  Patient has a charted history with anxiety disorder, bipolar disorder, major depressive disorder, schizoaffective disorder.  He is currently prescribed Effexor 150 mg and Clozaril 200mg  nighly  He reports feeling sluggish throughout the day which she attributes to the recent increase to the Effexor.  States he would like to be titrated off of this medication completely.  Denied any other symptoms related to medications.  States he is currently a at the [employer] where he is working Monday Wednesdays and Fridays.  He reports a good appetite.  States he is resting well throughout the night.   Medication  adjustments:   Decreased Effexor 150 mg daily to 75 mg daily patient to follow-up 4 weeks for efficacy consider ongoing downward taper. Continue  Clozaril 200 mg for mood stabilization   During evaluation [name] did not sound to be in any  acute distress. He is alert/oriented x 3; calm/cooperative; and is speaking in a clear tone at moderate volume, and normal pace;  his  thought process is coherent and relevant;  He reported intermittent auditory hallucination. he is denied that he is experiencing  delusional thought content; and he has denied suicidal/self-harm/homicidal ideation, psychosis, and paranoia.   Patient has remained calm throughout assessment and has answered questions appropriately.     Visit Diagnosis:      ICD-10-CM    1. Severe episode of recurrent major depressive disorder, without psychotic features (HCC)  F33.2 venlafaxine XR (EFFEXOR XR) 75 MG 24 hr capsule     2. Generalized anxiety disorder  F41.1 venlafaxine XR (EFFEXOR XR) 75 MG 24 hr capsule         Past Psychiatric History:    Past Medical History:      Past Medical History:  Diagnosis Date   Anxiety disorder, unspecified 01/10/2021   Bipolar disorder (HCC)     Depression     Schizoaffective disorder (HCC)           Past Surgical History:  Procedure Laterality Date   LAPAROTOMY N/A 12/25/2020    Procedure: EXPLORATORY LAPAROTOMY; REPAIR OF GROIN LACERATIONS X2, RIGHT THIGH LACERATION X 2; REPAIR RIGHT NECK AND LEFT NECK LACERATION AND  REPAIR OF LEFT WRIST LACERATION;  Surgeon: [name], MD;  Location: [facility] OR;  Service: General;  Laterality: N/A;   NO PAST SURGERIES          Family Psychiatric History:    Family History: No family history on file.   Social History:  Social History         Socioeconomic History   Marital status: Single      Spouse name: Not on file   Number of children: 0   Years of education: Not on file   Highest education level: Not on file  Occupational  History   Not on file  Tobacco Use   Smoking status: Former      Types: Cigarettes      Quit date: 07/31/2014      Years since quitting: 7.7   Smokeless tobacco: Never  Vaping Use   Vaping Use: Every day   Substances: THC, CBD  Substance and Sexual Activity   Alcohol use: Not Currently   Drug use: Yes      Types: Marijuana   Sexual activity: Never  Other Topics Concern   Not on file  Social History Narrative   Not on file    Social Determinants of Health    Financial Resource Strain: Not on file  Food Insecurity: Not on file  Transportation Needs: Not on file  Physical Activity: Not on file  Stress: Not on file  Social Connections: Not on file      Allergies: No Known Allergies   Metabolic Disorder Labs: Recent Labs       Lab Results  Component Value Date    HGBA1C 5.2 01/09/2021    MPG 102.54 01/09/2021    MPG 117 06/28/2020      Recent Labs  No results found for: "PROLACTIN"   Recent Labs       Lab Results  Component Value Date    CHOL 188 11/18/2020    TRIG 124 11/18/2020    HDL 69 11/18/2020    CHOLHDL 2.7 11/18/2020    VLDL 25 11/18/2020    LDLCALC 94 11/18/2020    LDLCALC 128 (H) 06/28/2020      Recent Labs       Lab Results  Component Value Date    TSH 0.828 01/25/2021    TSH 0.817 11/18/2020        Therapeutic Level Labs: Recent Labs  No results found for: "LITHIUM"   Recent Labs  No results found for: "VALPROATE"   Recent Labs  No results found for: "CBMZ"     Current Medications:       Current Outpatient Medications  Medication Sig Dispense Refill   cloZAPine (CLOZARIL) 100 MG tablet Take 2 tablets (200 mg total) by mouth at bedtime. 56 tablet 11   Melatonin 10 MG CAPS Take 10 mg by mouth at bedtime. 30 capsule 1   venlafaxine XR (EFFEXOR XR) 75 MG 24 hr capsule Take 1 capsule (75 mg total) by mouth daily. 30 capsule 0    No current facility-administered medications for this visit.        Musculoskeletal:      Psychiatric Specialty Exam: Review of Systems  Psychiatric/Behavioral:  Positive for decreased concentration. Negative for sleep disturbance and suicidal ideas. Hallucinations: intermittetn voice/ denied voices are command in nature.The patient is nervous/anxious.   All other systems reviewed and are negative.    There were no vitals taken for this visit.There is no height or weight on file  to calculate BMI.  General Appearance: NA  Eye Contact:  NA  Speech:  Clear and Coherent  Volume:  Normal  Mood:  NA  Affect:  NA  Thought Process:  Coherent  Orientation:  Full (Time, Place, and Person)  Thought Content: Logical   Suicidal Thoughts:  No  Homicidal Thoughts:  No  Memory:  Immediate;   Fair Recent;   Fair  Judgement:  Good  Insight:  Fair  Psychomotor Activity:  Normal  Concentration:  Concentration: Good  Recall:  Good  Fund of Knowledge: Good  Language: Good  Akathisia: N/A  Handed:  Right  AIMS (if indicated): not done  Assets:  Desire for Improvement Resilience Social Support  ADL's:  Intact  Cognition: WNL  Sleep:  Good    Screenings: AIMS     Flowsheet Row [facility]  AIMS Total Score 0         CAGE-AID     Flowsheet Row ED to Hosp-Admission (Discharged) from 12/25/2020 in [facility]  SURGICAL  CAGE-AID Score 0         GAD-7     Flowsheet Row Office Visit from 02/13/2022 in [facility] Clinical Support from 01/09/2022 in [facility] Clinical Support from 12/05/2021 in [facility] Clinical Support from 10/31/2021 in [facility] Office Visit from 10/03/2021 in [facility]  Total GAD-7 Score 9 12 12 15 13          PHQ2-9     Flowsheet Row Office Visit from 02/13/2022 in [facility] Clinical  Support from 01/09/2022 in [facility] Clinical Support from 12/05/2021 in [facility] Clinical Support from 10/31/2021 in [facility] Office Visit from 10/03/2021 in [facility]  PHQ-2 Total Score 4 3 4 4 4   PHQ-9 Total Score 10 10 10 15 10          Flowsheet Row Office Visit from 02/13/2022 in [facility] Clinical Support from 01/09/2022 in [facility] Clinical Support from 12/05/2021 in [facility]  C-SSRS RISK CATEGORY Low Risk Low Risk Moderate Risk             Assessment and Plan: Jester Klingberg 46 year old male seen and evaluated telephonically.  Overall he reports his mood as been stable with medication.  Does report feeling tired throughout the day which she attributes to the increased dose of Effexor.  And would like to titrate off of this medication.  Discussed medication taper.  He was receptive to plan.  Continues to deny suicidal or homicidal ideations.  Denied auditory visual hallucinations.  Reports he has been filling his time by volunteering at the [employer] he denied any other concerns at this time.  Does report intermittent auditory hallucinations but have been minimized since he has been compliant with medications.  Patient to follow-up 4 weeks for medication management.   Collaboration of Care: Collaboration of Care: Medication Management AEB patient to start Effexor Exar 75 mg daily, discontinued Effexor 150 mg   Patient/Guardian was advised Release of Information must be obtained prior to any record release in order to collaborate their care with an outside provider. Patient/Guardian was advised if they have not already done so to contact the registration department to sign all  necessary forms in order for [place] to release information regarding their care.    Consent: Patient/Guardian  gives verbal consent for treatment and assignment of benefits for services provided during this visit. Patient/Guardian expressed understanding and agreed to proceed.      [name], NP 04/30/2022, 11:45 AM

## 2022-05-03 ENCOUNTER — Other Ambulatory Visit: Payer: Self-pay

## 2022-05-03 ENCOUNTER — Telehealth (HOSPITAL_COMMUNITY): Payer: Self-pay | Admitting: *Deleted

## 2022-05-03 ENCOUNTER — Ambulatory Visit (HOSPITAL_COMMUNITY): Payer: No Payment, Other | Admitting: *Deleted

## 2022-05-03 DIAGNOSIS — F250 Schizoaffective disorder, bipolar type: Secondary | ICD-10-CM

## 2022-05-03 DIAGNOSIS — Z79899 Other long term (current) drug therapy: Secondary | ICD-10-CM

## 2022-05-03 NOTE — Telephone Encounter (Signed)
In as scheduled to have his monthly CBC lab draw due to long term use of Clozaril. 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 He reduced his effexor per Alcario Drought NP and so far he hasnt seen a difference.  Lab drawn in his R arm, he is doing well.

## 2022-05-04 LAB — CBC WITH DIFFERENTIAL
Basophils Absolute: 0 10*3/uL (ref 0.0–0.2)
Basos: 1 %
EOS (ABSOLUTE): 0.1 10*3/uL (ref 0.0–0.4)
Eos: 2 %
Hematocrit: 39.6 % (ref 37.5–51.0)
Hemoglobin: 13 g/dL (ref 13.0–17.7)
Immature Grans (Abs): 0 10*3/uL (ref 0.0–0.1)
Immature Granulocytes: 0 %
Lymphocytes Absolute: 1.1 10*3/uL (ref 0.7–3.1)
Lymphs: 21 %
MCH: 29.8 pg (ref 26.6–33.0)
MCHC: 32.8 g/dL (ref 31.5–35.7)
MCV: 91 fL (ref 79–97)
Monocytes Absolute: 0.3 10*3/uL (ref 0.1–0.9)
Monocytes: 7 %
Neutrophils Absolute: 3.4 10*3/uL (ref 1.4–7.0)
Neutrophils: 69 %
RBC: 4.36 x10E6/uL (ref 4.14–5.80)
RDW: 13.8 % (ref 11.6–15.4)
WBC: 4.9 10*3/uL (ref 3.4–10.8)

## 2022-05-28 ENCOUNTER — Ambulatory Visit (INDEPENDENT_AMBULATORY_CARE_PROVIDER_SITE_OTHER): Payer: No Payment, Other | Admitting: Physician Assistant

## 2022-05-28 ENCOUNTER — Encounter (HOSPITAL_COMMUNITY): Payer: Self-pay | Admitting: Physician Assistant

## 2022-05-28 VITALS — BP 125/87 | HR 85 | Ht 73.0 in | Wt 192.0 lb

## 2022-05-28 DIAGNOSIS — F250 Schizoaffective disorder, bipolar type: Secondary | ICD-10-CM

## 2022-05-28 DIAGNOSIS — F411 Generalized anxiety disorder: Secondary | ICD-10-CM | POA: Diagnosis not present

## 2022-05-28 DIAGNOSIS — F332 Major depressive disorder, recurrent severe without psychotic features: Secondary | ICD-10-CM

## 2022-05-28 MED ORDER — VENLAFAXINE HCL ER 37.5 MG PO CP24
37.5000 mg | ORAL_CAPSULE | Freq: Every day | ORAL | 1 refills | Status: DC
  Filled 2022-05-28 – 2022-06-19 (×2): qty 30, 30d supply, fill #0

## 2022-05-28 NOTE — Progress Notes (Signed)
BH MD/PA/NP OP Progress Note  05/28/2022 6:13 PM Sean Shepherd  MRN:  [id number]  Chief Complaint:  Chief Complaint  Patient presents with   Medication Management   HPI:   Sean Shepherd is a 46 year old male with a past psychiatric history significant for schizoaffective disorder (bipolar type), generalized anxiety disorder, major depressive disorder who presents to [facility] for follow-up and medication management.  Patient was last seen by Sean Shiver, NP on 04/30/2022.  Patient is currently being managed on the following medications:   Clozaril 200 mg at bedtime. Venlafaxine XR (Effexor XR) 75 mg 24-hour capsule daily  Patient reports that he talked to Sean Shiver, NP about weaning off his Effexor and his medication was subsequently decreased from 150 mg to 75 mg daily.  Since decreasing his dosage of venlafaxine, patient states that his mood has been more calm.  He still expresses some periods of lethargy during the middle of the day, but overall, patient states that his depressive symptoms are less debilitating.  Patient endorses the following depressive symptoms: lack of energy and lack of interest in doing things.  Patient also endorses anxiety and rates his anxiety as 6 or 7 out of 10.  Patient denies any new stressors at this time.  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  [facility]ays, Wednesdays, and Fridays.  PHQ-9 screen was performed with the patient scoring a 12.  A GAD-7 screen was also performed with the patient scoring a 10.  Patient is alert and oriented x4, calm, cooperative, and fully engaged in conversation during the encounter.  Patient endorses calm mood but states that his anxiety can pop up at any time.  Patient denies suicidal or homicidal ideations.  He endorses auditory hallucinations on occasion with his  last hallucination occurring yesterday.  He states that his auditory hallucinations are negative in nature and they are always putting him down.  Patient denies visual hallucinations and does not appear to be responding to internal/external stimuli.  Patient endorses good sleep and receives on average 8 hours of sleep each night.  Patient endorses good appetite and eats on average 3 meals per day.  Patient denies alcohol consumption, tobacco use, and illicit drug use.  Visit Diagnosis:    ICD-10-CM   1. Severe episode of recurrent major depressive disorder, without psychotic features (HCC)  F33.2 venlafaxine XR (EFFEXOR XR) 37.5 MG 24 hr capsule    2. Generalized anxiety disorder  F41.1 venlafaxine XR (EFFEXOR XR) 37.5 MG 24 hr capsule    3. Schizoaffective disorder, bipolar type ([place])  F25.0       Past Psychiatric History:  Schizoaffective disorder (bipolar type) Major depressive disorder Insomnia Generalized anxiety disorder  Past Medical History:  Past Medical History:  Diagnosis Date   Anxiety disorder, unspecified 01/10/2021   Bipolar disorder ([place])    Depression    Schizoaffective disorder ([place])     Past Surgical History:  Procedure Laterality Date   LAPAROTOMY N/A 12/25/2020   Procedure: EXPLORATORY LAPAROTOMY; REPAIR OF GROIN LACERATIONS X2, RIGHT THIGH LACERATION X 2; REPAIR RIGHT NECK AND LEFT NECK LACERATION AND REPAIR OF LEFT WRIST LACERATION;  Surgeon: Georganna Skeans, MD;  Location: [place];  Service: General;  Laterality: N/A;   NO PAST SURGERIES      Family Psychiatric History:  Father - Depression, currently taking Zoloft Sister - unsure, hx of  trouble with the law, unsure of med taken  Family History: History reviewed. No pertinent family history.  Social History:  Social History   Socioeconomic History   Marital status: Single    Spouse name: Not on file   Number of children: 0   Years of education: Not on file   Highest education level: Not on file   Occupational History   Not on file  Tobacco Use   Smoking status: Former    Types: Cigarettes    Quit date: 07/31/2014    Years since quitting: 7.8   Smokeless tobacco: Never  Vaping Use   Vaping Use: Every day   Substances: THC, CBD  Substance and Sexual Activity   Alcohol use: Not Currently   Drug use: Yes    Types: Marijuana   Sexual activity: Never  Other Topics Concern   Not on file  Social History Narrative   Not on file   Social Determinants of Health   Financial Resource Strain: Not on file  Food Insecurity: Not on file  Transportation Needs: Not on file  Physical Activity: Not on file  Stress: Not on file  Social Connections: Not on file    Allergies: No Known Allergies  Metabolic Disorder Labs: Lab Results  Component Value Date   HGBA1C 5.2 01/09/2021   MPG 102.54 01/09/2021   MPG 117 06/28/2020   No results found for: "PROLACTIN" Lab Results  Component Value Date   CHOL 188 11/18/2020   TRIG 124 11/18/2020   HDL 69 11/18/2020   CHOLHDL 2.7 11/18/2020   VLDL 25 11/18/2020   LDLCALC 94 11/18/2020   LDLCALC 128 (H) 06/28/2020   Lab Results  Component Value Date   TSH 0.828 01/25/2021   TSH 0.817 11/18/2020    Therapeutic Level Labs: No results found for: "LITHIUM" No results found for: "VALPROATE" No results found for: "CBMZ"  Current Medications: Current Outpatient Medications  Medication Sig Dispense Refill   cloZAPine (CLOZARIL) 100 MG tablet Take 2 tablets (200 mg total) by mouth at bedtime. 56 tablet 11   Melatonin 10 MG CAPS Take 10 mg by mouth at bedtime. 30 capsule 1   venlafaxine XR (EFFEXOR XR) 37.5 MG 24 hr capsule Take 1 capsule (37.5 mg total) by mouth daily. 30 capsule 1   No current facility-administered medications for this visit.     Musculoskeletal: Strength & Muscle Tone: within normal limits Gait & Station: normal Patient leans: N/A  Psychiatric Specialty Exam: Review of Systems  Psychiatric/Behavioral:   Positive for sleep disturbance. Negative for decreased concentration, dysphoric mood, hallucinations, self-injury and suicidal ideas. The patient is nervous/anxious. The patient is not hyperactive.     Blood pressure 125/87, pulse 85, height 6\' 1"  (1.854 m), weight 192 lb (87.1 kg).Body mass index is 25.33 kg/m.  General Appearance: Well Groomed  Eye Contact:  Good  Speech:  Clear and Coherent and Normal Rate  Volume:  Normal  Mood:  Anxious and Depressed  Affect:  Congruent  Thought Process:  Coherent, Goal Directed, and Descriptions of Associations: Intact  Orientation:  Full (Time, Place, and Person)  Thought Content: WDL   Suicidal Thoughts:  No  Homicidal Thoughts:  No  Memory:  Immediate;   Good Recent;   Good Remote;   Good  Judgement:  Good  Insight:  Good  Psychomotor Activity:  Normal  Concentration:  Concentration: Good and Attention Span: Good  Recall:  Good  Fund of Knowledge: Good  Language: Good  Akathisia:  No  Handed:  Right  AIMS (if indicated): not done  Assets:  Communication Skills Desire for Improvement Housing Social Support  ADL's:  Intact  Cognition: WNL  Sleep:  Good   Screenings: AIMS    Flowsheet Row Admission (Discharged) from 01/09/2021 in [place] 400[facility]  AIMS Total Score 0      [place] [facility] to Hosp-Admission (Discharged) from 12/25/2020 in [place] Score 0      GAD-7    Flowsheet Row Clinical Support from 05/28/2022 in [facility] Office Visit from 02/13/2022 in [place] from 01/09/2022 in [place] from 12/05/2021 in [place] from 10/31/2021 in [facility]  Total GAD-7 Score 10 9 12 12 15       PHQ2-9    Flowsheet Row Clinical Support from  05/28/2022 in [facility] Office Visit from 02/13/2022 in [place] from 01/09/2022 in [place] from 12/05/2021 in [place] from 10/31/2021 in [place]  PHQ-2 Total Score 4 4 3 4 4   PHQ-9 Total Score 12 10 10 10 15       [place] from 05/28/2022 in [facility] Office Visit from 02/13/2022 in [place] from 01/09/2022 in [place] Low Risk Low Risk Low Risk        Assessment and Plan:   Justun Anaya is a 46 year old male with a past psychiatric history significant for schizoaffective disorder (bipolar type), generalized anxiety disorder, major depressive disorder who presents to [facility] for follow-up and medication management.  Since the last encounter, patient has decreased his dosage of Effexor from 150 mg to 75 mg daily.  Since the dosage adjustment, patient endorses that he still experiences some depressive symptoms but states that they are less debilitating as they have been in the past.  Patient would like to continue to taper off his venlafaxine.  Patient still continues to experience anxiety as well as auditory hallucinations on occasion.  Patient to continue taking Clozaril as prescribed.  Provider to decrease patient's dosage of venlafaxine from 75 mg to 37.5 mg daily.  Patient was agreeable to adjustment.  Collaboration of Care: Collaboration of Care: Medication Management AEB for managing patient's psychiatric medications, Psychiatrist AEB patient being followed by mental health provider, and Referral or follow-up with counselor/therapist AEB patient is receiving counseling/therapy at [facility]  3 times a week  Patient/Guardian was advised Release of Information must be obtained prior to any record release in order to collaborate their care with an outside provider. Patient/Guardian was advised if they have not already done so to contact the registration department to sign all necessary forms in order for [place] to release information regarding their care.   Consent: Patient/Guardian gives verbal consent for treatment and assignment of benefits for services provided during this visit. Patient/Guardian expressed understanding and agreed to proceed.   1. Severe episode of recurrent major depressive disorder, without psychotic features (HCC)  - venlafaxine XR (EFFEXOR XR) 37.5 MG 24 hr capsule; Take 1 capsule (37.5 mg total) by mouth daily.  Dispense: 30 capsule; Refill: 1  2. Generalized anxiety disorder  - venlafaxine XR (EFFEXOR XR) 37.5 MG 24 hr capsule; Take 1 capsule (37.5 mg total) by mouth daily.  Dispense: 30 capsule; Refill: 1  3. Schizoaffective disorder, bipolar type [facility]) Patient to continue taking Clozaril 200 mg at bedtime for the management of his schizoaffective disorder (bipolar type)  Patient to follow-up in 4 weeks Provider spent a total of 19 minutes with the patient/reviewing patient's chart  Malachy Mood, PA 05/28/2022, 6:13 PM

## 2022-05-29 ENCOUNTER — Other Ambulatory Visit: Payer: Self-pay

## 2022-06-04 ENCOUNTER — Ambulatory Visit (HOSPITAL_COMMUNITY): Payer: No Payment, Other

## 2022-06-04 ENCOUNTER — Other Ambulatory Visit: Payer: Self-pay

## 2022-06-04 DIAGNOSIS — F331 Major depressive disorder, recurrent, moderate: Secondary | ICD-10-CM

## 2022-06-04 DIAGNOSIS — F332 Major depressive disorder, recurrent severe without psychotic features: Secondary | ICD-10-CM

## 2022-06-04 NOTE — Addendum Note (Signed)
Addended by: Griffith Citron F on: 06/04/2022 09:20 AM   Modules accepted: Orders

## 2022-06-04 NOTE — Progress Notes (Signed)
PATIENT PRESENTS TO THE OFFICE FOR ROUTINE BLOOD DRAW FROM RIGHT ARM DRAWN BY Jakerria Kingbird CMA

## 2022-06-07 ENCOUNTER — Ambulatory Visit (HOSPITAL_COMMUNITY): Payer: Self-pay

## 2022-06-20 ENCOUNTER — Other Ambulatory Visit: Payer: Self-pay
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
## 2022-06-24 NOTE — Patient Instructions (Signed)
Thank you for attending your appointment today.  -- INCREASE Effexor back to 75 mg and try moving to nighttime. -- Continue Clozapine 200 mg nightly. -- Continue other medications as prescribed.  Please do not make any changes to medications without first discussing with your provider. If you are experiencing a psychiatric emergency, please call 911 or present to your nearest emergency department. Additional crisis, medication management, and therapy resources are included below.  [facility]  [address], [place], [place] [address] [phone] WALK-IN URGENT CARE 24/7 FOR ANYONE [address], [place], [place] Fax: [phone] [email or web address] *Interpreters available *Accepts all insurance and uninsured for Urgent Care needs *Accepts Medicaid and uninsured for outpatient treatment (below)      ONLY FOR [facility]  Below:    Outpatient New Patient Assessment/Therapy Walk-ins:        Monday -Thursday 8am until slots are full.        Every Friday 1pm-4pm  (first come, first served)                   New Patient Psychiatry/Medication Management        Monday-Friday 8am-11am (first come, first served)               For all walk-ins we ask that you arrive by 7:15am, because patients will be seen in the order of arrival.

## 2022-06-25 ENCOUNTER — Other Ambulatory Visit: Payer: Self-pay

## 2022-06-25 ENCOUNTER — Encounter (HOSPITAL_COMMUNITY): Payer: Self-pay | Admitting: Psychiatry

## 2022-06-25 ENCOUNTER — Ambulatory Visit (INDEPENDENT_AMBULATORY_CARE_PROVIDER_SITE_OTHER): Payer: No Payment, Other | Admitting: Psychiatry

## 2022-06-25 DIAGNOSIS — Z79899 Other long term (current) drug therapy: Secondary | ICD-10-CM | POA: Diagnosis not present

## 2022-06-25 DIAGNOSIS — F250 Schizoaffective disorder, bipolar type: Secondary | ICD-10-CM | POA: Diagnosis not present

## 2022-06-25 DIAGNOSIS — F411 Generalized anxiety disorder: Secondary | ICD-10-CM

## 2022-06-25 DIAGNOSIS — F331 Major depressive disorder, recurrent, moderate: Secondary | ICD-10-CM | POA: Diagnosis not present

## 2022-06-25 MED ORDER — VENLAFAXINE HCL ER 75 MG PO CP24
75.0000 mg | ORAL_CAPSULE | Freq: Every evening | ORAL | 2 refills | Status: DC
  Filled 2022-06-25: qty 30, 30d supply, fill #0
  Filled 2022-08-01: qty 30, 30d supply, fill #1

## 2022-06-25 MED ORDER — CLOZAPINE 200 MG PO TABS: 200.0000 mg | ORAL_TABLET | Freq: Every evening | ORAL | 11 refills | Status: DC

## 2022-06-26 ENCOUNTER — Other Ambulatory Visit: Payer: Self-pay

## 2022-07-02 ENCOUNTER — Ambulatory Visit (HOSPITAL_COMMUNITY): Payer: No Payment, Other

## 2022-07-02 DIAGNOSIS — F332 Major depressive disorder, recurrent severe without psychotic features: Secondary | ICD-10-CM

## 2022-07-02 DIAGNOSIS — F250 Schizoaffective disorder, bipolar type: Secondary | ICD-10-CM

## 2022-07-02 DIAGNOSIS — Z79899 Other long term (current) drug therapy: Secondary | ICD-10-CM

## 2022-07-02 NOTE — Progress Notes (Unsigned)
In to pick up his month of clozaril and while he was here drew his CBC w Diff today rather than on fri the 2 nd as initially scheduled. Blood drawn from his R arm without issue. Sent to [facility] to be processed. He is doing well. He is to return in one month to pick up next supply of his clozaril and fri Dec 1st for his next lab draw.

## 2022-07-03 NOTE — Progress Notes (Addendum)
Chart Opened  in ERROR -- Sean Shepherd [name]

## 2022-07-05 ENCOUNTER — Ambulatory Visit (HOSPITAL_COMMUNITY): Payer: Self-pay

## 2022-07-08 ENCOUNTER — Other Ambulatory Visit (HOSPITAL_COMMUNITY): Payer: Self-pay | Admitting: *Deleted

## 2022-07-18 ENCOUNTER — Ambulatory Visit: Payer: Self-pay | Attending: Internal Medicine | Admitting: Internal Medicine

## 2022-07-18 ENCOUNTER — Encounter: Payer: Self-pay | Admitting: Internal Medicine

## 2022-07-18 ENCOUNTER — Other Ambulatory Visit: Payer: Self-pay

## 2022-07-18 VITALS — BP 110/64 | HR 86 | Temp 97.7°F | Wt 192.0 lb

## 2022-07-18 DIAGNOSIS — N6342 Unspecified lump in left breast, subareolar: Secondary | ICD-10-CM

## 2022-07-18 DIAGNOSIS — Z1211 Encounter for screening for malignant neoplasm of colon: Secondary | ICD-10-CM

## 2022-07-18 DIAGNOSIS — N6341 Unspecified lump in right breast, subareolar: Secondary | ICD-10-CM

## 2022-07-18 DIAGNOSIS — N62 Hypertrophy of breast: Secondary | ICD-10-CM

## 2022-07-18 DIAGNOSIS — F250 Schizoaffective disorder, bipolar type: Secondary | ICD-10-CM

## 2022-07-18 DIAGNOSIS — Z2821 Immunization not carried out because of patient refusal: Secondary | ICD-10-CM

## 2022-07-18 DIAGNOSIS — N6320 Unspecified lump in the left breast, unspecified quadrant: Secondary | ICD-10-CM

## 2022-07-18 DIAGNOSIS — Z0000 Encounter for general adult medical examination without abnormal findings: Secondary | ICD-10-CM

## 2022-07-18 DIAGNOSIS — Z9079 Acquired absence of other genital organ(s): Secondary | ICD-10-CM

## 2022-07-18 DIAGNOSIS — N6310 Unspecified lump in the right breast, unspecified quadrant: Secondary | ICD-10-CM

## 2022-07-18 NOTE — Patient Instructions (Signed)
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
## 2022-07-19 ENCOUNTER — Telehealth: Payer: Self-pay | Admitting: Emergency Medicine

## 2022-07-19 ENCOUNTER — Other Ambulatory Visit: Payer: Self-pay | Admitting: *Deleted

## 2022-07-19 NOTE — Telephone Encounter (Signed)
Copied from CRM [id number]. Topic: General - Other >> Jul 19, 2022  8:38 AM Everette C wrote: Reason for CRM: Christy with [facility] has called to request orders for a diagnostic mammogram for the patient   Neysa Bonito would like the imaging orders submitted to [facility]   Please contact further when possible

## 2022-07-19 NOTE — Addendum Note (Signed)
Addended by: Jonah Blue B on: 07/19/2022 11:03 PM   Modules accepted: Orders

## 2022-07-22 NOTE — Telephone Encounter (Signed)
[facility]. I was informed to ask PCP to resend orders for the ultrasound as was sent before and please see message.

## 2022-07-22 NOTE — Addendum Note (Signed)
Addended by: Jonah Blue B on: 07/22/2022 11:07 PM   Modules accepted: Orders

## 2022-07-22 NOTE — Patient Instructions (Signed)
Thank you for attending your appointment today.  -- We did not make any medication changes today. Please continue medications as prescribed.  Please do not make any changes to medications without first discussing with your provider. If you are experiencing a psychiatric emergency, please call 911 or present to your nearest emergency department. Additional crisis, medication management, and therapy resources are included below.  [facility]  [address], [place], [place] [address] [phone] WALK-IN URGENT CARE 24/7 FOR ANYONE [address], [place], [place]  [phone] Fax: [phone] [email or web address] *Interpreters available *Accepts all insurance and uninsured for Urgent Care needs *Accepts Medicaid and uninsured for outpatient treatment (below)      ONLY FOR [place] Residents  Below:    Outpatient New Patient Assessment/Therapy Walk-ins:        Monday -Thursday 8am until slots are full.        Every Friday 1pm-4pm  (first come, first served)                   New Patient Psychiatry/Medication Management        Monday-Friday 8am-11am (first come, first served)               For all walk-ins we ask that you arrive by 7:15am, because patients will be seen in the order of arrival.   
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
## 2022-07-23 ENCOUNTER — Encounter (HOSPITAL_COMMUNITY): Payer: Self-pay | Admitting: Psychiatry

## 2022-07-23 ENCOUNTER — Ambulatory Visit: Payer: Self-pay | Attending: Internal Medicine

## 2022-07-23 ENCOUNTER — Ambulatory Visit (INDEPENDENT_AMBULATORY_CARE_PROVIDER_SITE_OTHER): Payer: No Payment, Other | Admitting: Psychiatry

## 2022-07-23 VITALS — BP 123/78 | HR 80 | Ht 73.0 in | Wt 194.0 lb

## 2022-07-23 DIAGNOSIS — F250 Schizoaffective disorder, bipolar type: Secondary | ICD-10-CM | POA: Diagnosis not present

## 2022-07-23 DIAGNOSIS — N62 Hypertrophy of breast: Secondary | ICD-10-CM

## 2022-07-23 DIAGNOSIS — F411 Generalized anxiety disorder: Secondary | ICD-10-CM | POA: Diagnosis not present

## 2022-07-23 DIAGNOSIS — Z9079 Acquired absence of other genital organ(s): Secondary | ICD-10-CM

## 2022-07-23 DIAGNOSIS — Z79899 Other long term (current) drug therapy: Secondary | ICD-10-CM | POA: Diagnosis not present

## 2022-07-27 LAB — TESTOSTERONE,FREE AND TOTAL
Testosterone, Free: 2 pg/mL — ABNORMAL LOW (ref 6.8–21.5)
Testosterone: 29 ng/dL — ABNORMAL LOW (ref 264–916)

## 2022-07-29 ENCOUNTER — Encounter: Payer: Self-pay | Admitting: Internal Medicine

## 2022-07-30 ENCOUNTER — Other Ambulatory Visit: Payer: Self-pay | Admitting: Internal Medicine

## 2022-07-30 DIAGNOSIS — Z9079 Acquired absence of other genital organ(s): Secondary | ICD-10-CM

## 2022-07-30 DIAGNOSIS — R7989 Other specified abnormal findings of blood chemistry: Secondary | ICD-10-CM

## 2022-08-01 ENCOUNTER — Other Ambulatory Visit: Payer: Self-pay

## 2022-08-02 ENCOUNTER — Other Ambulatory Visit (HOSPITAL_COMMUNITY): Payer: No Payment, Other

## 2022-08-02 DIAGNOSIS — Z79899 Other long term (current) drug therapy: Secondary | ICD-10-CM

## 2022-08-03 LAB — CBC WITH DIFFERENTIAL/PLATELET
Basophils Absolute: 0 10*3/uL (ref 0.0–0.2)
Basos: 0 %
EOS (ABSOLUTE): 0.1 10*3/uL (ref 0.0–0.4)
Eos: 2 %
Hematocrit: 39.4 % (ref 37.5–51.0)
Hemoglobin: 13.1 g/dL (ref 13.0–17.7)
Immature Grans (Abs): 0 10*3/uL (ref 0.0–0.1)
Immature Granulocytes: 0 %
Lymphocytes Absolute: 0.9 10*3/uL (ref 0.7–3.1)
Lymphs: 16 %
MCH: 29.5 pg (ref 26.6–33.0)
MCHC: 33.2 g/dL (ref 31.5–35.7)
MCV: 89 fL (ref 79–97)
Monocytes Absolute: 0.3 10*3/uL (ref 0.1–0.9)
Monocytes: 6 %
Neutrophils Absolute: 4 10*3/uL (ref 1.4–7.0)
Neutrophils: 76 %
Platelets: 229 10*3/uL (ref 150–450)
RBC: 4.44 x10E6/uL (ref 4.14–5.80)
RDW: 12.6 % (ref 11.6–15.4)
WBC: 5.3 10*3/uL (ref 3.4–10.8)

## 2022-08-03 LAB — SPECIMEN STATUS REPORT

## 2022-08-13 ENCOUNTER — Ambulatory Visit
Admission: RE | Admit: 2022-08-13 | Discharge: 2022-08-13 | Disposition: A | Discharge status: Home / Self Care | Payer: Self-pay | Source: Ambulatory Visit | Attending: Internal Medicine | Admitting: Internal Medicine

## 2022-08-13 DIAGNOSIS — N6342 Unspecified lump in left breast, subareolar: Secondary | ICD-10-CM

## 2022-08-13 DIAGNOSIS — N62 Hypertrophy of breast: Secondary | ICD-10-CM

## 2022-08-13 DIAGNOSIS — N6341 Unspecified lump in right breast, subareolar: Secondary | ICD-10-CM

## 2022-08-19 NOTE — Patient Instructions (Incomplete)
Thank you for attending your appointment today.  -- START clozapine 12.5 mg in the morning; continue clozapine 200 mg nightly -- Continue Effexor 75 mg daily -- Continue other medications as prescribed.  Please do not make any changes to medications without first discussing with your provider. If you are experiencing a psychiatric emergency, please call 911 or present to your nearest emergency department. Additional crisis, medication management, and therapy resources are included below.  [facility]  [address] [place], [place] [address] [phone] WALK-IN URGENT CARE 24/7 FOR ANYONE [address], [place], [place]  [phone] Fax: [phone] [email or web address] *Interpreters available *Accepts all insurance and uninsured for Urgent Care needs *Accepts Medicaid and uninsured for outpatient treatment (below)      ONLY FOR [facility]  Below:    Outpatient New Patient Assessment/Therapy Walk-ins:        Monday -Thursday 8am until slots are full.        Every Friday 1pm-4pm  (first come, first served)                   New Patient Psychiatry/Medication Management        Monday-Friday 8am-11am (first come, first served)               For all walk-ins we ask that you arrive by 7:15am, because patients will be seen in the order of arrival.
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
## 2022-08-20 ENCOUNTER — Encounter (HOSPITAL_COMMUNITY): Payer: Self-pay | Admitting: Psychiatry

## 2022-08-20 ENCOUNTER — Other Ambulatory Visit: Payer: Self-pay

## 2022-08-20 ENCOUNTER — Ambulatory Visit (INDEPENDENT_AMBULATORY_CARE_PROVIDER_SITE_OTHER): Payer: No Payment, Other | Admitting: Psychiatry

## 2022-08-20 VITALS — BP 132/80 | HR 83 | Temp 98.1°F | Ht 74.0 in | Wt 195.0 lb

## 2022-08-20 DIAGNOSIS — F250 Schizoaffective disorder, bipolar type: Secondary | ICD-10-CM

## 2022-08-20 DIAGNOSIS — F411 Generalized anxiety disorder: Secondary | ICD-10-CM

## 2022-08-20 MED ORDER — CLOZAPINE 25 MG PO TABS: 12.5000 mg | ORAL_TABLET | Freq: Every morning | ORAL | 2 refills | Status: DC

## 2022-08-20 MED ORDER — VENLAFAXINE HCL ER 75 MG PO CP24
75.0000 mg | ORAL_CAPSULE | Freq: Every day | ORAL | 2 refills | Status: DC
  Filled 2022-08-20 – 2022-09-03 (×2): qty 30, 30d supply, fill #0
  Filled 2022-10-02: qty 30, 30d supply, fill #1
  Filled 2022-11-01 (×2): qty 30, 30d supply, fill #2

## 2022-08-21 ENCOUNTER — Other Ambulatory Visit: Payer: Self-pay | Admitting: Internal Medicine

## 2022-08-21 DIAGNOSIS — R7989 Other specified abnormal findings of blood chemistry: Secondary | ICD-10-CM

## 2022-09-03 ENCOUNTER — Other Ambulatory Visit: Payer: Self-pay

## 2022-09-03 ENCOUNTER — Other Ambulatory Visit (HOSPITAL_COMMUNITY): Payer: No Payment, Other

## 2022-09-05 ENCOUNTER — Other Ambulatory Visit: Payer: Self-pay

## 2022-09-18 ENCOUNTER — Encounter: Payer: Self-pay | Admitting: Internal Medicine

## 2022-09-26 ENCOUNTER — Other Ambulatory Visit (HOSPITAL_COMMUNITY): Payer: Self-pay | Admitting: Psychiatry

## 2022-09-26 ENCOUNTER — Other Ambulatory Visit (HOSPITAL_COMMUNITY): Payer: Self-pay

## 2022-09-26 DIAGNOSIS — Z79899 Other long term (current) drug therapy: Secondary | ICD-10-CM

## 2022-09-27 LAB — CBC WITH DIFFERENTIAL/PLATELET
Basophils Absolute: 0 10*3/uL (ref 0.0–0.2)
Basos: 0 %
EOS (ABSOLUTE): 0.1 10*3/uL (ref 0.0–0.4)
Eos: 2 %
Hematocrit: 41.6 % (ref 37.5–51.0)
Hemoglobin: 13.3 g/dL (ref 13.0–17.7)
Immature Grans (Abs): 0 10*3/uL (ref 0.0–0.1)
Immature Granulocytes: 0 %
Lymphocytes Absolute: 1.1 10*3/uL (ref 0.7–3.1)
Lymphs: 16 %
MCH: 28.7 pg (ref 26.6–33.0)
MCHC: 32 g/dL (ref 31.5–35.7)
MCV: 90 fL (ref 79–97)
Monocytes Absolute: 0.5 10*3/uL (ref 0.1–0.9)
Monocytes: 6 %
Neutrophils Absolute: 5.4 10*3/uL (ref 1.4–7.0)
Neutrophils: 76 %
Platelets: 242 10*3/uL (ref 150–450)
RBC: 4.63 x10E6/uL (ref 4.14–5.80)
RDW: 13.5 % (ref 11.6–15.4)
WBC: 7.2 10*3/uL (ref 3.4–10.8)

## 2022-09-27 LAB — SPECIMEN STATUS REPORT

## 2022-10-02 ENCOUNTER — Other Ambulatory Visit: Payer: Self-pay

## 2022-10-03 ENCOUNTER — Other Ambulatory Visit: Payer: Self-pay

## 2022-10-04 ENCOUNTER — Encounter (HOSPITAL_COMMUNITY): Payer: No Payment, Other | Admitting: Student in an Organized Health Care Education/Training Program

## 2022-10-08 ENCOUNTER — Ambulatory Visit: Payer: Self-pay | Admitting: Urology

## 2022-10-08 ENCOUNTER — Encounter: Payer: Self-pay | Admitting: Urology

## 2022-10-08 VITALS — BP 130/84 | HR 93 | Ht 72.0 in | Wt 190.0 lb

## 2022-10-08 DIAGNOSIS — E291 Testicular hypofunction: Secondary | ICD-10-CM
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
## 2022-10-29 ENCOUNTER — Other Ambulatory Visit (HOSPITAL_COMMUNITY): Payer: Medicaid Other

## 2022-10-30 ENCOUNTER — Other Ambulatory Visit (HOSPITAL_COMMUNITY): Payer: Self-pay | Admitting: Psychiatry

## 2022-10-31 LAB — CBC WITH DIFFERENTIAL/PLATELET
Basophils Absolute: 0 10*3/uL (ref 0.0–0.2)
Basos: 1 %
EOS (ABSOLUTE): 0.1 10*3/uL (ref 0.0–0.4)
Eos: 1 %
Hematocrit: 43.9 % (ref 37.5–51.0)
Hemoglobin: 13.5 g/dL (ref 13.0–17.7)
Immature Grans (Abs): 0 10*3/uL (ref 0.0–0.1)
Immature Granulocytes: 0 %
Lymphocytes Absolute: 1 10*3/uL (ref 0.7–3.1)
Lymphs: 15 %
MCH: 29.4 pg (ref 26.6–33.0)
MCHC: 30.8 g/dL — ABNORMAL LOW (ref 31.5–35.7)
MCV: 96 fL (ref 79–97)
Monocytes Absolute: 0.5 10*3/uL (ref 0.1–0.9)
Monocytes: 7 %
Neutrophils Absolute: 4.8 10*3/uL (ref 1.4–7.0)
Neutrophils: 76 %
Platelets: 234 10*3/uL (ref 150–450)
RBC: 4.59 x10E6/uL (ref 4.14–5.80)
RDW: 13.2 % (ref 11.6–15.4)
WBC: 6.3 10*3/uL (ref 3.4–10.8)

## 2022-10-31 LAB — SPECIMEN STATUS REPORT

## 2022-11-01 ENCOUNTER — Other Ambulatory Visit: Payer: Self-pay

## 2022-11-05 ENCOUNTER — Other Ambulatory Visit: Payer: Self-pay

## 2022-11-28 ENCOUNTER — Telehealth (HOSPITAL_COMMUNITY): Payer: Self-pay | Admitting: *Deleted

## 2022-11-28 ENCOUNTER — Other Ambulatory Visit (HOSPITAL_COMMUNITY): Payer: Self-pay

## 2022-11-28 ENCOUNTER — Other Ambulatory Visit: Payer: Self-pay | Admitting: Psychiatry

## 2022-11-28 NOTE — Telephone Encounter (Signed)
In as scheduled monthly lab re his use of Clozaril. Blood drawn from R arm, but able to only obtain a small sample which is sent off to [facility] but may require more and patient aware. He is doing well, "about the same" and offers no complaints. He will return thurs to pick up his Clozaril.

## 2022-11-29 LAB — CBC WITH DIFFERENTIAL/PLATELET
Basophils Absolute: 0.1 10*3/uL (ref 0.0–0.2)
Basos: 1 %
EOS (ABSOLUTE): 0.1 10*3/uL (ref 0.0–0.4)
Eos: 1 %
Hematocrit: 44.1 % (ref 37.5–51.0)
Hemoglobin: 14 g/dL (ref 13.0–17.7)
Immature Grans (Abs): 0.1 10*3/uL (ref 0.0–0.1)
Immature Granulocytes: 1 %
Lymphocytes Absolute: 1.3 10*3/uL (ref 0.7–3.1)
Lymphs: 22 %
MCH: 28.8 pg (ref 26.6–33.0)
MCHC: 31.7 g/dL (ref 31.5–35.7)
MCV: 91 fL (ref 79–97)
Monocytes Absolute: 0.4 10*3/uL (ref 0.1–0.9)
Monocytes: 7 %
Neutrophils Absolute: 3.9 10*3/uL (ref 1.4–7.0)
Neutrophils: 68 %
Platelets: 247 10*3/uL (ref 150–450)
RBC: 4.86 x10E6/uL (ref 4.14–5.80)
RDW: 13.7 % (ref 11.6–15.4)
WBC: 5.8 10*3/uL (ref 3.4–10.8)

## 2022-11-29 LAB — SPECIMEN STATUS REPORT

## 2022-12-03 ENCOUNTER — Other Ambulatory Visit (HOSPITAL_COMMUNITY): Payer: Self-pay | Admitting: Psychiatry

## 2022-12-03 ENCOUNTER — Telehealth (HOSPITAL_COMMUNITY): Payer: Self-pay | Admitting: *Deleted

## 2022-12-03 DIAGNOSIS — F250 Schizoaffective disorder, bipolar type: Secondary | ICD-10-CM

## 2022-12-03 MED ORDER — CLOZAPINE 25 MG PO TABS: 12.5000 mg | ORAL_TABLET | Freq: Every morning | ORAL | 2 refills | Status: DC
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
## 2022-12-03 NOTE — Patient Instructions (Signed)
Thank you for attending your appointment today.  -- INCREASE Effexor to 150 mg daily -- Continue clozapine 200 mg nightly  Please do not make any changes to medications without first discussing with your provider. If you are experiencing a psychiatric emergency, please call 911 or present to your nearest emergency department. Additional crisis, medication management, and therapy resources are included below.  [facility]  [address], [place], [place] [address] [phone] WALK-IN URGENT CARE 24/7 FOR ANYONE [address], [place], [place]  [phone] Fax: [phone] [email or web address] *Interpreters available *Accepts all insurance and uninsured for Urgent Care needs *Accepts Medicaid and uninsured for outpatient treatment (below)      ONLY FOR [facility]  Below:    Outpatient New Patient Assessment/Therapy Walk-ins:        Monday -Thursday 8am until slots are full.        Every Friday 1pm-4pm  (first come, first served)                   New Patient Psychiatry/Medication Management        Monday-Friday 8am-11am (first come, first served)               For all walk-ins we ask that you arrive by 7:15am, because patients will be seen in the order of arrival.

## 2022-12-03 NOTE — Telephone Encounter (Signed)
Rx Refill Request  Take 0.5 tablets (12.5 mg total) by mouth In  the morning. Continue clozapine 200 mg  nightly. Dispense: 15 tablet,  Refills: 2 ordered  08/20/2022  08/20/22 ---- 11/18/22

## 2022-12-05 ENCOUNTER — Other Ambulatory Visit: Payer: Self-pay

## 2022-12-05 ENCOUNTER — Encounter (HOSPITAL_COMMUNITY): Payer: Self-pay | Admitting: Psychiatry

## 2022-12-05 ENCOUNTER — Ambulatory Visit (INDEPENDENT_AMBULATORY_CARE_PROVIDER_SITE_OTHER): Payer: Medicaid Other | Admitting: Psychiatry

## 2022-12-05 VITALS — BP 130/90 | HR 87 | Ht 73.0 in | Wt 201.0 lb

## 2022-12-05 DIAGNOSIS — F411 Generalized anxiety disorder: Secondary | ICD-10-CM | POA: Diagnosis not present

## 2022-12-05 DIAGNOSIS — F250 Schizoaffective disorder, bipolar type: Secondary | ICD-10-CM

## 2022-12-05 MED ORDER — VENLAFAXINE HCL ER 150 MG PO CP24
150.0000 mg | ORAL_CAPSULE | Freq: Every day | ORAL | 2 refills | Status: DC
  Filled 2022-12-05: qty 30, 30d supply, fill #0
  Filled 2023-01-07: qty 30, 30d supply, fill #1

## 2022-12-11 ENCOUNTER — Other Ambulatory Visit: Payer: Self-pay

## 2022-12-25 ENCOUNTER — Other Ambulatory Visit (HOSPITAL_COMMUNITY): Payer: Medicaid Other

## 2023-01-07 ENCOUNTER — Other Ambulatory Visit: Payer: Self-pay

## 2023-01-08 ENCOUNTER — Other Ambulatory Visit (HOSPITAL_COMMUNITY): Payer: Self-pay | Admitting: Psychiatry

## 2023-01-08 ENCOUNTER — Other Ambulatory Visit (HOSPITAL_COMMUNITY): Payer: Self-pay

## 2023-01-09 LAB — CBC WITH DIFFERENTIAL/PLATELET
Basophils Absolute: 0 10*3/uL (ref 0.0–0.2)
Basos: 1 %
EOS (ABSOLUTE): 0.1 10*3/uL (ref 0.0–0.4)
Eos: 2 %
Hematocrit: 42.4 % (ref 37.5–51.0)
Hemoglobin: 13.7 g/dL (ref 13.0–17.7)
Immature Grans (Abs): 0 10*3/uL (ref 0.0–0.1)
Immature Granulocytes: 0 %
Lymphocytes Absolute: 1.1 10*3/uL (ref 0.7–3.1)
Lymphs: 20 %
MCH: 28.5 pg (ref 26.6–33.0)
MCHC: 32.3 g/dL (ref 31.5–35.7)
MCV: 88 fL (ref 79–97)
Monocytes Absolute: 0.4 10*3/uL (ref 0.1–0.9)
Monocytes: 7 %
Neutrophils Absolute: 3.9 10*3/uL (ref 1.4–7.0)
Neutrophils: 70 %
Platelets: 256 10*3/uL (ref 150–450)
RBC: 4.8 x10E6/uL (ref 4.14–5.80)
RDW: 13.7 % (ref 11.6–15.4)
WBC: 5.5 10*3/uL (ref 3.4–10.8)

## 2023-01-10 ENCOUNTER — Other Ambulatory Visit: Payer: Self-pay
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
## 2023-01-16 ENCOUNTER — Encounter (HOSPITAL_COMMUNITY): Payer: Self-pay | Admitting: Psychiatry

## 2023-01-16 ENCOUNTER — Other Ambulatory Visit: Payer: Self-pay

## 2023-01-16 ENCOUNTER — Other Ambulatory Visit (HOSPITAL_COMMUNITY): Payer: Self-pay

## 2023-01-16 ENCOUNTER — Ambulatory Visit (INDEPENDENT_AMBULATORY_CARE_PROVIDER_SITE_OTHER): Payer: Medicaid Other | Admitting: Psychiatry

## 2023-01-16 VITALS — BP 121/94 | HR 84 | Resp 16 | Ht 73.0 in | Wt 201.8 lb

## 2023-01-16 DIAGNOSIS — F411 Generalized anxiety disorder: Secondary | ICD-10-CM | POA: Diagnosis not present

## 2023-01-16 DIAGNOSIS — F250 Schizoaffective disorder, bipolar type: Secondary | ICD-10-CM | POA: Diagnosis not present

## 2023-01-16 MED ORDER — CLOZAPINE 200 MG PO TABS
200.0000 mg | ORAL_TABLET | Freq: Every evening | ORAL | 2 refills | Status: DC
  Filled 2023-01-16 (×2): qty 30, 30d supply, fill #0

## 2023-01-16 MED ORDER — VENLAFAXINE HCL ER 150 MG PO CP24
150.0000 mg | ORAL_CAPSULE | Freq: Every day | ORAL | 2 refills | Status: DC
  Filled 2023-01-16 – 2023-02-10 (×2): qty 30, 30d supply, fill #0

## 2023-01-16 NOTE — Patient Instructions (Signed)
Thank you for attending your appointment today.  -- We did not make any medication changes today. Please continue medications as prescribed.  Please do not make any changes to medications without first discussing with your provider. If you are experiencing a psychiatric emergency, please call 911 or present to your nearest emergency department. Additional crisis, medication management, and therapy resources are included below.  [facility]  [address], [place], [place] [address] [phone] WALK-IN URGENT CARE 24/7 FOR ANYONE [address], [place], [place]  [phone] Fax: [phone] [email or web address] *Interpreters available *Accepts all insurance and uninsured for Urgent Care needs *Accepts Medicaid and uninsured for outpatient treatment (below)      ONLY FOR [place] Residents  Below:    Outpatient New Patient Assessment/Therapy Walk-ins:        Monday -Thursday 8am until slots are full.        Every Friday 1pm-4pm  (first come, first served)                   New Patient Psychiatry/Medication Management        Monday-Friday 8am-11am (first come, first served)               For all walk-ins we ask that you arrive by 7:15am, because patients will be seen in the order of arrival.   

## 2023-01-17 ENCOUNTER — Other Ambulatory Visit (HOSPITAL_COMMUNITY): Payer: Self-pay

## 2023-01-18 ENCOUNTER — Other Ambulatory Visit (HOSPITAL_COMMUNITY): Payer: Self-pay

## 2023-01-21 ENCOUNTER — Other Ambulatory Visit: Payer: Medicaid Other

## 2023-02-05 ENCOUNTER — Other Ambulatory Visit (HOSPITAL_COMMUNITY): Payer: Self-pay

## 2023-02-10 ENCOUNTER — Other Ambulatory Visit: Payer: Self-pay

## 2023-02-12 ENCOUNTER — Other Ambulatory Visit (HOSPITAL_COMMUNITY): Payer: Self-pay

## 2023-02-13 ENCOUNTER — Other Ambulatory Visit: Payer: Self-pay | Admitting: Psychiatry

## 2023-02-14 LAB — CBC WITH DIFFERENTIAL/PLATELET
Basophils Absolute: 0 10*3/uL (ref 0.0–0.2)
Basos: 1 %
EOS (ABSOLUTE): 0.1 10*3/uL (ref 0.0–0.4)
Eos: 2 %
Hematocrit: 45.1 % (ref 37.5–51.0)
Hemoglobin: 13.6 g/dL (ref 13.0–17.7)
Immature Grans (Abs): 0 10*3/uL (ref 0.0–0.1)
Immature Granulocytes: 0 %
Lymphocytes Absolute: 1.1 10*3/uL (ref 0.7–3.1)
Lymphs: 18 %
MCH: 27.6 pg (ref 26.6–33.0)
MCHC: 30.2 g/dL — ABNORMAL LOW (ref 31.5–35.7)
MCV: 92 fL (ref 79–97)
Monocytes Absolute: 0.4 10*3/uL (ref 0.1–0.9)
Monocytes: 7 %
Neutrophils Absolute: 4.6 10*3/uL (ref 1.4–7.0)
Neutrophils: 72 %
Platelets: 222 10*3/uL (ref 150–450)
RBC: 4.92 x10E6/uL (ref 4.14–5.80)
RDW: 15 % (ref 11.6–15.4)
WBC: 6.3 10*3/uL (ref 3.4–10.8)

## 2023-02-14 LAB — SPECIMEN STATUS REPORT
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
## 2023-03-11 ENCOUNTER — Encounter (HOSPITAL_COMMUNITY): Payer: Self-pay | Admitting: Psychiatry

## 2023-03-11 ENCOUNTER — Ambulatory Visit (INDEPENDENT_AMBULATORY_CARE_PROVIDER_SITE_OTHER): Payer: MEDICAID | Admitting: Psychiatry

## 2023-03-11 ENCOUNTER — Other Ambulatory Visit (HOSPITAL_COMMUNITY): Payer: Self-pay

## 2023-03-11 VITALS — BP 145/87 | HR 86 | Resp 16 | Ht 73.0 in | Wt 199.0 lb

## 2023-03-11 DIAGNOSIS — Z79899 Other long term (current) drug therapy: Secondary | ICD-10-CM | POA: Diagnosis not present

## 2023-03-11 DIAGNOSIS — F250 Schizoaffective disorder, bipolar type: Secondary | ICD-10-CM

## 2023-03-11 DIAGNOSIS — F411 Generalized anxiety disorder: Secondary | ICD-10-CM | POA: Diagnosis not present

## 2023-03-11 MED ORDER — CLOZAPINE 200 MG PO TABS
200.0000 mg | ORAL_TABLET | Freq: Every evening | ORAL | 2 refills | Status: DC
Start: 2023-03-11 — End: 2023-05-20
  Filled 2023-03-11: qty 30, 30d supply, fill #0
  Filled 2023-04-09: qty 30, 30d supply, fill #1
  Filled 2023-05-08: qty 30, 30d supply, fill #2

## 2023-03-11 MED ORDER — VENLAFAXINE HCL ER 150 MG PO CP24
150.0000 mg | ORAL_CAPSULE | Freq: Every day | ORAL | 2 refills | Status: DC
Start: 2023-03-11 — End: 2023-05-20
  Filled 2023-03-11: qty 30, 30d supply, fill #0
  Filled 2023-04-09: qty 30, 30d supply, fill #1
  Filled 2023-05-08 – 2023-05-09 (×2): qty 30, 30d supply, fill #2

## 2023-03-11 NOTE — Patient Instructions (Signed)
Thank you for attending your appointment today.  -- We did not make any medication changes today. Please continue medications as prescribed.  Please do not make any changes to medications without first discussing with your provider. If you are experiencing a psychiatric emergency, please call 911 or present to your nearest emergency department. Additional crisis, medication management, and therapy resources are included below.  [facility]  [address], [place], [place] [address] [phone] WALK-IN URGENT CARE 24/7 FOR ANYONE [address], [place],   [phone] Fax: [phone] [email or web address] *Interpreters available *Accepts all insurance and uninsured for Urgent Care needs *Accepts Medicaid and uninsured for outpatient treatment (below)      ONLY FOR [place] Residents  Below:    Outpatient New Patient Assessment/Therapy Walk-ins:        Monday -Thursday 8am until slots are full.        Every Friday 1pm-4pm  (first come, first served)                   New Patient Psychiatry/Medication Management        Monday-Friday 8am-11am (first come, first served)               For all walk-ins we ask that you arrive by 7:15am, because patients will be seen in the order of arrival.   

## 2023-03-12 ENCOUNTER — Other Ambulatory Visit: Payer: Self-pay

## 2023-03-12 ENCOUNTER — Encounter (HOSPITAL_COMMUNITY): Payer: Self-pay

## 2023-03-12 ENCOUNTER — Other Ambulatory Visit (HOSPITAL_COMMUNITY): Payer: Self-pay

## 2023-03-12 DIAGNOSIS — G4700 Insomnia, unspecified: Secondary | ICD-10-CM

## 2023-03-12 DIAGNOSIS — F200 Paranoid schizophrenia: Secondary | ICD-10-CM

## 2023-03-12 DIAGNOSIS — F411 Generalized anxiety disorder: Secondary | ICD-10-CM

## 2023-03-12 NOTE — Progress Notes (Signed)
Patient ID: Sean Shepherd, male   DOB: [birth year], 47 y.o.   MRN: [id number] Patient presents to the office for labs< labs were drawn with no issue or complaint by Lupita Leash . Notified Pt that provider will reach out with results .

## 2023-03-14 LAB — CBC WITH DIFFERENTIAL/PLATELET
Basophils Absolute: 0 10*3/uL (ref 0.0–0.2)
Basos: 1 %
EOS (ABSOLUTE): 0.1 10*3/uL (ref 0.0–0.4)
Eos: 2 %
Hematocrit: 45.5 % (ref 37.5–51.0)
Hemoglobin: 13.8 g/dL (ref 13.0–17.7)
Immature Grans (Abs): 0 10*3/uL (ref 0.0–0.1)
Immature Granulocytes: 0 %
Lymphocytes Absolute: 1.1 10*3/uL (ref 0.7–3.1)
Lymphs: 21 %
MCH: 27.2 pg (ref 26.6–33.0)
MCHC: 30.3 g/dL — ABNORMAL LOW (ref 31.5–35.7)
MCV: 90 fL (ref 79–97)
Monocytes Absolute: 0.4 10*3/uL (ref 0.1–0.9)
Monocytes: 8 %
Neutrophils Absolute: 3.7 10*3/uL (ref 1.4–7.0)
Neutrophils: 68 %
Platelets: 240 10*3/uL (ref 150–450)
RBC: 5.08 x10E6/uL (ref 4.14–5.80)
RDW: 15.6 % — ABNORMAL HIGH (ref 11.6–15.4)
WBC: 5.3 10*3/uL (ref 3.4–10.8)

## 2023-03-14 LAB — LIPID PANEL
Chol/HDL Ratio: 2.9 ratio (ref 0.0–5.0)
Cholesterol, Total: 247 mg/dL — ABNORMAL HIGH (ref 100–199)
HDL: 85 mg/dL (ref >39)
LDL Chol Calc (NIH): 151 mg/dL — ABNORMAL HIGH (ref 0–99)
Triglycerides: 66 mg/dL (ref 0–149)
VLDL Cholesterol Cal: 11 mg/dL (ref 5–40)

## 2023-03-14 LAB — SPECIMEN STATUS REPORT

## 2023-03-14 LAB — HEMOGLOBIN A1C
Est. average glucose Bld gHb Est-mCnc: 117 mg/dL
Hgb A1c MFr Bld: 5.7 % — ABNORMAL HIGH (ref 4.8–5.6)

## 2023-03-18 NOTE — Progress Notes (Signed)
Provider discussed patients abnormal lab results with patient. Provider recommended patient speak with his PCP regarding his elevated cholesterol.  He endorsed understanding and agreed.  No other concerns noted at this time.

## 2023-04-07 ENCOUNTER — Other Ambulatory Visit (HOSPITAL_COMMUNITY): Payer: Self-pay

## 2023-04-08 ENCOUNTER — Other Ambulatory Visit (HOSPITAL_COMMUNITY): Payer: Self-pay | Admitting: Psychiatry

## 2023-04-10 ENCOUNTER — Ambulatory Visit (INDEPENDENT_AMBULATORY_CARE_PROVIDER_SITE_OTHER): Payer: MEDICAID | Admitting: Urology

## 2023-04-10 ENCOUNTER — Other Ambulatory Visit: Payer: Self-pay

## 2023-04-10 ENCOUNTER — Other Ambulatory Visit (HOSPITAL_COMMUNITY): Payer: Self-pay

## 2023-04-10 ENCOUNTER — Encounter: Payer: Self-pay | Admitting: Urology

## 2023-04-10 VITALS — BP 125/83 | HR 88 | Ht 73.0 in | Wt 195.0 lb

## 2023-04-10 DIAGNOSIS — E291 Testicular hypofunction: Secondary | ICD-10-CM | POA: Diagnosis not present
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
## 2023-04-11 ENCOUNTER — Telehealth: Payer: Self-pay

## 2023-04-11 NOTE — Telephone Encounter (Signed)
Spoke with patient regarding results and the need to reduce his dose of testosterone. Appt scheduled for repeat lab draw in the afternoon in 6 weeks. Pt verbalized understanding.

## 2023-04-11 NOTE — Addendum Note (Signed)
Addended by: Joline Maxcy on: 04/11/2023 09:34 AM   Modules accepted: Orders

## 2023-05-07 ENCOUNTER — Other Ambulatory Visit (HOSPITAL_COMMUNITY): Payer: Self-pay

## 2023-05-09 ENCOUNTER — Other Ambulatory Visit (HOSPITAL_COMMUNITY): Payer: Self-pay

## 2023-05-10 ENCOUNTER — Other Ambulatory Visit (HOSPITAL_COMMUNITY): Payer: Self-pay

## 2023-05-12 ENCOUNTER — Other Ambulatory Visit (HOSPITAL_COMMUNITY): Payer: Self-pay

## 2023-05-14 ENCOUNTER — Other Ambulatory Visit (HOSPITAL_COMMUNITY): Payer: Self-pay

## 2023-05-15 ENCOUNTER — Other Ambulatory Visit (HOSPITAL_COMMUNITY): Payer: Self-pay
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
## 2023-05-20 ENCOUNTER — Ambulatory Visit (INDEPENDENT_AMBULATORY_CARE_PROVIDER_SITE_OTHER): Payer: MEDICAID | Admitting: Psychiatry

## 2023-05-20 ENCOUNTER — Other Ambulatory Visit (HOSPITAL_COMMUNITY): Payer: Self-pay

## 2023-05-20 ENCOUNTER — Encounter (HOSPITAL_COMMUNITY): Payer: Self-pay | Admitting: Psychiatry

## 2023-05-20 VITALS — BP 140/76 | HR 98 | Resp 16 | Ht 73.0 in | Wt 190.6 lb

## 2023-05-20 DIAGNOSIS — F250 Schizoaffective disorder, bipolar type: Secondary | ICD-10-CM | POA: Diagnosis not present

## 2023-05-20 DIAGNOSIS — F411 Generalized anxiety disorder: Secondary | ICD-10-CM | POA: Diagnosis not present

## 2023-05-20 MED ORDER — VENLAFAXINE HCL ER 150 MG PO CP24
150.0000 mg | ORAL_CAPSULE | Freq: Every day | ORAL | 2 refills | Status: DC
  Filled 2023-05-20 – 2023-06-09 (×2): qty 30, 30d supply, fill #0
  Filled 2023-07-08: qty 30, 30d supply, fill #1

## 2023-05-20 MED ORDER — CLOZAPINE 200 MG PO TABS
200.0000 mg | ORAL_TABLET | Freq: Every evening | ORAL | 2 refills | Status: DC
Start: 2023-05-20 — End: 2023-10-10
  Filled 2023-05-20 – 2023-06-16 (×2): qty 30, 30d supply, fill #0
  Filled 2023-07-08 – 2023-07-16 (×2): qty 30, 30d supply, fill #1
  Filled 2023-09-08: qty 30, 30d supply, fill #2

## 2023-05-20 NOTE — Patient Instructions (Signed)
Thank you for attending your appointment today.  -- We did not make any medication changes today. Please continue medications as prescribed.  Please do not make any changes to medications without first discussing with your provider. If you are experiencing a psychiatric emergency, please call 911 or present to your nearest emergency department. Additional crisis, medication management, and therapy resources are included below.  [facility]  [address], [place], [place] [address] [phone] WALK-IN URGENT CARE 24/7 FOR ANYONE [address], [place], [place]  [phone] Fax: [phone] [email or web address] *Interpreters available *Accepts all insurance and uninsured for Urgent Care needs *Accepts Medicaid and uninsured for outpatient treatment (below)      ONLY FOR [facility]  Below:    Outpatient New Patient Assessment/Therapy Walk-ins:        Monday -Thursday 8am until slots are full.        Every Friday 1pm-4pm  (first come, first served)                   New Patient Psychiatry/Medication Management        Monday-Friday 8am-11am (first come, first served)               For all walk-ins we ask that you arrive by 7:15am, because patients will be seen in the order of arrival.

## 2023-05-21 ENCOUNTER — Other Ambulatory Visit (HOSPITAL_COMMUNITY): Payer: Self-pay

## 2023-05-28 ENCOUNTER — Other Ambulatory Visit: Payer: MEDICAID

## 2023-05-28 DIAGNOSIS — E291 Testicular hypofunction: Secondary | ICD-10-CM

## 2023-05-29 LAB — TESTOSTERONE: Testosterone: 291 ng/dL (ref 264–916)

## 2023-06-09 ENCOUNTER — Other Ambulatory Visit (HOSPITAL_COMMUNITY): Payer: Self-pay

## 2023-06-10 ENCOUNTER — Other Ambulatory Visit (HOSPITAL_COMMUNITY): Payer: Self-pay

## 2023-06-10 ENCOUNTER — Other Ambulatory Visit (HOSPITAL_COMMUNITY): Payer: Self-pay | Admitting: Psychiatry

## 2023-06-11 NOTE — Progress Notes (Signed)
Patient presented for labs today , labs were drawn with no complaints

## 2023-06-16 ENCOUNTER — Other Ambulatory Visit (HOSPITAL_COMMUNITY): Payer: Self-pay

## 2023-06-16 LAB — SPECIMEN STATUS REPORT

## 2023-06-16 LAB — CBC WITH DIFFERENTIAL/PLATELET

## 2023-06-17 ENCOUNTER — Other Ambulatory Visit (HOSPITAL_COMMUNITY): Payer: Self-pay

## 2023-06-19 ENCOUNTER — Other Ambulatory Visit (HOSPITAL_COMMUNITY): Payer: Self-pay

## 2023-07-07 ENCOUNTER — Encounter: Payer: Self-pay | Admitting: Internal Medicine

## 2023-07-07 ENCOUNTER — Other Ambulatory Visit (HOSPITAL_COMMUNITY): Payer: Medicare Other

## 2023-07-07 NOTE — Progress Notes (Signed)
Patient presented for labs, were drawn with no complaints

## 2023-07-08 ENCOUNTER — Other Ambulatory Visit (HOSPITAL_COMMUNITY): Payer: Self-pay

## 2023-07-08 ENCOUNTER — Other Ambulatory Visit (HOSPITAL_COMMUNITY): Payer: Self-pay | Admitting: Psychiatry

## 2023-07-08 ENCOUNTER — Other Ambulatory Visit: Payer: Self-pay

## 2023-07-09 LAB — CBC WITH DIFFERENTIAL/PLATELET
Basophils Absolute: 0 10*3/uL (ref 0.0–0.2)
Basos: 0 %
EOS (ABSOLUTE): 0.1 10*3/uL (ref 0.0–0.4)
Eos: 2 %
Hematocrit: 45.9 % (ref 37.5–51.0)
Hemoglobin: 14.4 g/dL (ref 13.0–17.7)
Immature Grans (Abs): 0 10*3/uL (ref 0.0–0.1)
Immature Granulocytes: 0 %
Lymphocytes Absolute: 1 10*3/uL (ref 0.7–3.1)
Lymphs: 21 %
MCH: 29.5 pg (ref 26.6–33.0)
MCHC: 31.4 g/dL — ABNORMAL LOW (ref 31.5–35.7)
MCV: 94 fL (ref 79–97)
Monocytes Absolute: 0.3 10*3/uL (ref 0.1–0.9)
Monocytes: 7 %
Neutrophils Absolute: 3.4 10*3/uL (ref 1.4–7.0)
Neutrophils: 70 %
Platelets: 234 10*3/uL (ref 150–450)
RBC: 4.88 x10E6/uL (ref 4.14–5.80)
RDW: 14.4 % (ref 11.6–15.4)
WBC: 4.9 10*3/uL (ref 3.4–10.8)

## 2023-07-09 LAB — SPECIMEN STATUS REPORT

## 2023-07-10 ENCOUNTER — Other Ambulatory Visit (HOSPITAL_COMMUNITY): Payer: Self-pay

## 2023-07-16 ENCOUNTER — Other Ambulatory Visit (HOSPITAL_COMMUNITY): Payer: Self-pay

## 2023-07-17 ENCOUNTER — Other Ambulatory Visit (HOSPITAL_COMMUNITY): Payer: Self-pay
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
## 2023-07-22 ENCOUNTER — Ambulatory Visit (INDEPENDENT_AMBULATORY_CARE_PROVIDER_SITE_OTHER): Payer: MEDICAID | Admitting: Psychiatry

## 2023-07-22 ENCOUNTER — Encounter (HOSPITAL_COMMUNITY): Payer: Self-pay | Admitting: Psychiatry

## 2023-07-22 ENCOUNTER — Other Ambulatory Visit (HOSPITAL_COMMUNITY): Payer: Self-pay

## 2023-07-22 VITALS — BP 136/97 | HR 80 | Resp 16 | Ht 73.0 in | Wt 188.8 lb

## 2023-07-22 DIAGNOSIS — Z79899 Other long term (current) drug therapy: Secondary | ICD-10-CM | POA: Diagnosis not present

## 2023-07-22 DIAGNOSIS — F411 Generalized anxiety disorder: Secondary | ICD-10-CM | POA: Diagnosis not present

## 2023-07-22 DIAGNOSIS — F250 Schizoaffective disorder, bipolar type: Secondary | ICD-10-CM | POA: Diagnosis not present

## 2023-07-22 MED ORDER — CLOZAPINE 200 MG PO TABS
200.0000 mg | ORAL_TABLET | Freq: Every evening | ORAL | 2 refills | Status: DC
Start: 2023-07-22 — End: 2023-10-10
  Filled 2023-10-06: qty 30, 30d supply, fill #0

## 2023-07-22 MED ORDER — VENLAFAXINE HCL ER 150 MG PO CP24
150.0000 mg | ORAL_CAPSULE | Freq: Every day | ORAL | 2 refills | Status: DC
  Filled 2023-07-22 – 2023-08-07 (×2): qty 30, 30d supply, fill #0
  Filled 2023-09-08: qty 30, 30d supply, fill #1
  Filled 2023-10-06: qty 30, 30d supply, fill #2

## 2023-07-22 NOTE — Patient Instructions (Signed)
Thank you for attending your appointment today.  -- We did not make any medication changes today. Please continue medications as prescribed.  Please do not make any changes to medications without first discussing with your provider. If you are experiencing a psychiatric emergency, please call 911 or present to your nearest emergency department. Additional crisis, medication management, and therapy resources are included below.  [facility]  [address] [place], [place] [address] [phone] WALK-IN URGENT CARE 24/7 FOR ANYONE [address], [place], [place]  [phone] Fax: [phone] [email or web address] *Interpreters available *Accepts all insurance and uninsured for Urgent Care needs *Accepts Medicaid and uninsured for outpatient treatment (below)      ONLY FOR [facility]  Below:    Outpatient New Patient Assessment/Therapy Walk-ins:        Monday, Wednesday, and Thursday 8am until slots are full (first come, first served)                   New Patient Psychiatry/Medication Management        Monday-Friday 8am-11am (first come, first served)               For all walk-ins we ask that you arrive by 7:15am, because patients will be seen in the order of arrival.

## 2023-07-23 ENCOUNTER — Other Ambulatory Visit (HOSPITAL_COMMUNITY): Payer: Self-pay

## 2023-08-04 ENCOUNTER — Other Ambulatory Visit (HOSPITAL_COMMUNITY): Payer: Self-pay | Admitting: Psychiatry

## 2023-08-04 ENCOUNTER — Other Ambulatory Visit (INDEPENDENT_AMBULATORY_CARE_PROVIDER_SITE_OTHER): Payer: MEDICAID

## 2023-08-04 DIAGNOSIS — F250 Schizoaffective disorder, bipolar type: Secondary | ICD-10-CM

## 2023-08-04 DIAGNOSIS — Z79899 Other long term (current) drug therapy: Secondary | ICD-10-CM

## 2023-08-04 NOTE — Progress Notes (Signed)
Patient in today for due CBC with Differential and Platelets blood draw.  Patient presented with appropriate affect, level and pleasant mood and denied any current symptoms or issues.  Patient stated taking medications as prescribed.  Lab draw completed on patient's right arm and patient tolerated without any complaint of pain or discomfort.  Patient agreed to keep next appointment and lab draw completed for send out to [facility] with copy of patient's insurance.

## 2023-08-05 LAB — CBC WITH DIFFERENTIAL/PLATELET
Basophils Absolute: 0 10*3/uL (ref 0.0–0.2)
Basos: 0 %
EOS (ABSOLUTE): 0.1 10*3/uL (ref 0.0–0.4)
Eos: 1 %
Hematocrit: 43.6 % (ref 37.5–51.0)
Hemoglobin: 14.3 g/dL (ref 13.0–17.7)
Immature Grans (Abs): 0 10*3/uL (ref 0.0–0.1)
Immature Granulocytes: 0 %
Lymphocytes Absolute: 1.1 10*3/uL (ref 0.7–3.1)
Lymphs: 15 %
MCH: 30.2 pg (ref 26.6–33.0)
MCHC: 32.8 g/dL (ref 31.5–35.7)
MCV: 92 fL (ref 79–97)
Monocytes Absolute: 0.4 10*3/uL (ref 0.1–0.9)
Monocytes: 6 %
Neutrophils Absolute: 5.7 10*3/uL (ref 1.4–7.0)
Neutrophils: 78 %
Platelets: 207 10*3/uL (ref 150–450)
RBC: 4.74 x10E6/uL (ref 4.14–5.80)
RDW: 14.1 % (ref 11.6–15.4)
WBC: 7.4 10*3/uL (ref 3.4–10.8)

## 2023-08-05 LAB — SPECIMEN STATUS REPORT

## 2023-08-07 ENCOUNTER — Other Ambulatory Visit (HOSPITAL_COMMUNITY): Payer: Self-pay

## 2023-08-11 ENCOUNTER — Other Ambulatory Visit (HOSPITAL_COMMUNITY): Payer: Self-pay

## 2023-08-20 ENCOUNTER — Other Ambulatory Visit (HOSPITAL_COMMUNITY): Payer: MEDICAID

## 2023-09-01 ENCOUNTER — Encounter (HOSPITAL_COMMUNITY): Payer: Self-pay

## 2023-09-01 ENCOUNTER — Other Ambulatory Visit (INDEPENDENT_AMBULATORY_CARE_PROVIDER_SITE_OTHER): Payer: MEDICAID

## 2023-09-01 DIAGNOSIS — Z79899 Other long term (current) drug therapy: Secondary | ICD-10-CM | POA: Diagnosis not present

## 2023-09-01 NOTE — Progress Notes (Signed)
Patient had law draw with no complaints .

## 2023-09-08 ENCOUNTER — Other Ambulatory Visit: Payer: Self-pay

## 2023-09-08 ENCOUNTER — Other Ambulatory Visit (HOSPITAL_COMMUNITY): Payer: Self-pay

## 2023-09-09 ENCOUNTER — Other Ambulatory Visit (HOSPITAL_COMMUNITY): Payer: Self-pay
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
## 2023-09-23 ENCOUNTER — Encounter (HOSPITAL_COMMUNITY): Payer: Self-pay

## 2023-09-23 ENCOUNTER — Telehealth (HOSPITAL_COMMUNITY): Payer: Medicare Other | Admitting: Psychiatry

## 2023-09-29 ENCOUNTER — Other Ambulatory Visit (INDEPENDENT_AMBULATORY_CARE_PROVIDER_SITE_OTHER): Payer: Medicare Other

## 2023-09-29 DIAGNOSIS — F250 Schizoaffective disorder, bipolar type: Secondary | ICD-10-CM | POA: Diagnosis not present

## 2023-09-29 DIAGNOSIS — Z79899 Other long term (current) drug therapy: Secondary | ICD-10-CM

## 2023-09-29 NOTE — Progress Notes (Cosign Needed)
Pt tolerated venipuncture well with no complaints. In left arm.    CMA, Ja'Bron Melvyn Neth

## 2023-09-30 LAB — CBC WITH DIFFERENTIAL/PLATELET
Basophils Absolute: 0.1 10*3/uL (ref 0.0–0.2)
Basos: 1 %
EOS (ABSOLUTE): 0.1 10*3/uL (ref 0.0–0.4)
Eos: 2 %
Hematocrit: 42.3 % (ref 37.5–51.0)
Hemoglobin: 14 g/dL (ref 13.0–17.7)
Immature Grans (Abs): 0 10*3/uL (ref 0.0–0.1)
Immature Granulocytes: 0 %
Lymphocytes Absolute: 1.3 10*3/uL (ref 0.7–3.1)
Lymphs: 15 %
MCH: 30.5 pg (ref 26.6–33.0)
MCHC: 33.1 g/dL (ref 31.5–35.7)
MCV: 92 fL (ref 79–97)
Monocytes Absolute: 0.5 10*3/uL (ref 0.1–0.9)
Monocytes: 6 %
Neutrophils Absolute: 6.4 10*3/uL (ref 1.4–7.0)
Neutrophils: 76 %
Platelets: 208 10*3/uL (ref 150–450)
RBC: 4.59 x10E6/uL (ref 4.14–5.80)
RDW: 13.1 % (ref 11.6–15.4)
WBC: 8.4 10*3/uL (ref 3.4–10.8)

## 2023-10-06 ENCOUNTER — Other Ambulatory Visit (HOSPITAL_COMMUNITY): Payer: Self-pay

## 2023-10-06 ENCOUNTER — Other Ambulatory Visit: Payer: Self-pay

## 2023-10-09 ENCOUNTER — Other Ambulatory Visit (HOSPITAL_COMMUNITY): Payer: Self-pay

## 2023-10-10 ENCOUNTER — Other Ambulatory Visit (HOSPITAL_COMMUNITY): Payer: Self-pay

## 2023-10-10 ENCOUNTER — Other Ambulatory Visit (HOSPITAL_COMMUNITY): Payer: Self-pay | Admitting: Psychiatry

## 2023-10-10 ENCOUNTER — Telehealth (HOSPITAL_COMMUNITY): Payer: Self-pay

## 2023-10-10 ENCOUNTER — Telehealth (HOSPITAL_COMMUNITY): Payer: Self-pay | Admitting: *Deleted

## 2023-10-10 DIAGNOSIS — F411 Generalized anxiety disorder: Secondary | ICD-10-CM

## 2023-10-10 DIAGNOSIS — F250 Schizoaffective disorder, bipolar type: Secondary | ICD-10-CM

## 2023-10-10 MED ORDER — CLOZAPINE 200 MG PO TABS
200.0000 mg | ORAL_TABLET | Freq: Every evening | ORAL | 2 refills | Status: DC
  Filled 2023-10-10: qty 30, 30d supply, fill #0

## 2023-10-10 MED ORDER — VENLAFAXINE HCL ER 150 MG PO CP24
150.0000 mg | ORAL_CAPSULE | Freq: Every day | ORAL | 2 refills | Status: DC
  Filled 2023-10-10: qty 30, 30d supply, fill #0

## 2023-10-10 NOTE — Telephone Encounter (Signed)
 Pt called today stating that he would like to have his   clozapine  (CLOZARIL ) 200 MG tablet  Refilled, and he also is requesting to have his labs released if possible.

## 2023-10-10 NOTE — Telephone Encounter (Signed)
Pt informed and contacted.

## 2023-10-10 NOTE — Telephone Encounter (Signed)
 PA submitted online with cover my meds. Awaiting decision. Decision will be within 24 hours.

## 2023-10-10 NOTE — Telephone Encounter (Addendum)
 Pt tried to go pick up meds at  Pharmacy but couldn't, just called Pt and Pharmacy stating that REMS was not done properly and is awaiting a providers response still.

## 2023-10-10 NOTE — Progress Notes (Signed)
 Refills of psychotropics sent to preferred pharmacy to bridge until next appointment. REMS has been updated with recent clozapine  labs.  Sean Hones, MD 10/10/23

## 2023-10-14 ENCOUNTER — Telehealth (HOSPITAL_COMMUNITY): Payer: Self-pay | Admitting: *Deleted

## 2023-10-14 ENCOUNTER — Other Ambulatory Visit (HOSPITAL_COMMUNITY): Payer: Self-pay

## 2023-10-14 NOTE — Telephone Encounter (Signed)
Looked online with cover my meds. PA for Clozaril was approved. Called to notify pharmacy and patient. They did not have enough in stock and will order for pick up tomorrow.

## 2023-10-15 ENCOUNTER — Ambulatory Visit (INDEPENDENT_AMBULATORY_CARE_PROVIDER_SITE_OTHER): Payer: Medicare Other | Admitting: Urology

## 2023-10-15 ENCOUNTER — Encounter: Payer: Self-pay | Admitting: Urology

## 2023-10-15 VITALS — BP 144/90 | HR 96

## 2023-10-15 DIAGNOSIS — Z125 Encounter for screening for malignant neoplasm of prostate: Secondary | ICD-10-CM

## 2023-10-15 DIAGNOSIS — N400 Enlarged prostate without lower urinary tract symptoms: Secondary | ICD-10-CM

## 2023-10-15 DIAGNOSIS — E291 Testicular hypofunction: Secondary | ICD-10-CM | POA: Diagnosis not present

## 2023-10-15 NOTE — Progress Notes (Signed)
   Assessment: 1. Hypogonadism in male   2. Prostate cancer screening   3. Benign prostatic hyperplasia, unspecified whether lower urinary tract symptoms present     Plan: Continue BI T-cream 5% Psa and T today (applied T cream this am) FU 24mo for recheck.  Chief Complaint: No chief complaint on file.   HPI: Sean Shepherd is a 48 y.o. male who presents for continued evaluation of hypogonadism. He is on 5% BI cream and reports doing well with resolution of hypogonadal symptoms. Patient has castrate levels of testosterone due to self-inflicted orchiectomy as well as unilateral testicular agenesis.   Lab review:  Hb = 14.3 (08/2023) T = 291  (05/2023)  Portions of the above documentation were copied from a prior visit for review purposes only.  Allergies: No Known Allergies  PMH: Past Medical History:  Diagnosis Date   Anxiety disorder, unspecified 01/10/2021   Bipolar disorder (HCC)    Depression    Schizoaffective disorder (HCC)     PSH: Past Surgical History:  Procedure Laterality Date   LAPAROTOMY N/A 12/25/2020   Procedure: EXPLORATORY LAPAROTOMY; REPAIR OF GROIN LACERATIONS X2, RIGHT THIGH LACERATION X 2; REPAIR RIGHT NECK AND LEFT NECK LACERATION AND REPAIR OF LEFT WRIST LACERATION;  Surgeon: [name], MD;  Location: [facility] OR;  Service: General;  Laterality: N/A;   NO PAST SURGERIES      SH: Social History   Tobacco Use   Smoking status: Former    Current packs/day: 0.00    Types: Cigarettes    Quit date: 07/31/2014    Years since quitting: 9.2   Smokeless tobacco: Never  Vaping Use   Vaping status: Every Day   Substances: THC, CBD  Substance Use Topics   Alcohol use: Yes    Alcohol/week: 3.0 standard drinks of alcohol    Types: 3 Cans of beer per week    Comment: 2-3 beers a week   Drug use: Not Currently    ROS: Constitutional:  Negative for fever, chills, weight loss CV: Negative for chest pain, previous MI, hypertension Respiratory:   Negative for shortness of breath, wheezing, sleep apnea, frequent cough GI:  Negative for nausea, vomiting, bloody stool, GERD  PE: BP (!) 144/90   Pulse 96  GENERAL APPEARANCE:  Well appearing, well developed, well nourished, NAD

## 2023-10-16 ENCOUNTER — Encounter (HOSPITAL_COMMUNITY): Payer: Self-pay

## 2023-10-16 ENCOUNTER — Other Ambulatory Visit (HOSPITAL_COMMUNITY): Payer: Self-pay

## 2023-10-16 LAB — PSA: Prostate Specific Ag, Serum: 0.2 ng/mL (ref 0.0–4.0)

## 2023-10-16 LAB — TESTOSTERONE: Testosterone: 175 ng/dL — ABNORMAL LOW (ref 264–916)
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
## 2023-11-03 ENCOUNTER — Telehealth (INDEPENDENT_AMBULATORY_CARE_PROVIDER_SITE_OTHER): Payer: Medicare Other | Admitting: Psychiatry

## 2023-11-03 ENCOUNTER — Other Ambulatory Visit (HOSPITAL_COMMUNITY): Payer: Self-pay

## 2023-11-03 ENCOUNTER — Other Ambulatory Visit: Payer: Self-pay

## 2023-11-03 ENCOUNTER — Encounter (HOSPITAL_COMMUNITY): Payer: Self-pay | Admitting: Psychiatry

## 2023-11-03 DIAGNOSIS — F411 Generalized anxiety disorder: Secondary | ICD-10-CM | POA: Diagnosis not present

## 2023-11-03 DIAGNOSIS — F250 Schizoaffective disorder, bipolar type: Secondary | ICD-10-CM

## 2023-11-03 MED ORDER — VENLAFAXINE HCL ER 150 MG PO CP24
150.0000 mg | ORAL_CAPSULE | Freq: Every day | ORAL | 2 refills | Status: DC
  Filled 2023-11-03: qty 30, 30d supply, fill #0
  Filled 2023-12-05: qty 30, 30d supply, fill #1

## 2023-11-03 MED ORDER — CLOZAPINE 200 MG PO TABS
200.0000 mg | ORAL_TABLET | Freq: Every evening | ORAL | 2 refills | Status: DC
  Filled 2023-11-03 – 2023-11-17 (×2): qty 30, 30d supply, fill #0
  Filled 2023-12-19: qty 30, 30d supply, fill #1

## 2023-11-03 NOTE — Patient Instructions (Signed)
 Thank you for attending your appointment today.  -- We did not make any medication changes today. Please continue medications as prescribed. -- Here is a helpful brochure about Cobenfy: [email or web address]  Please do not make any changes to medications without first discussing with your provider. If you are experiencing a psychiatric emergency, please call 911 or present to your nearest emergency department. Additional crisis, medication management, and therapy resources are included below.  [facility]  [address] [place], [place] [address] [phone] WALK-IN URGENT CARE 24/7 FOR ANYONE [address], [place], [place]  [phone] Fax: [phone] [email or web address] *Interpreters available *Accepts all insurance and uninsured for Urgent Care needs *Accepts Medicaid and uninsured for outpatient treatment (below)      ONLY FOR [facility]  Below:    Outpatient New Patient Assessment/Therapy Walk-ins:        Monday, Wednesday, and Thursday 8am until slots are full (first come, first served)                   New Patient Psychiatry/Medication Management        Monday-Friday 8am-11am (first come, first served)               For all walk-ins we ask that you arrive by 7:15am, because patients will be seen in the order of arrival.

## 2023-11-10 ENCOUNTER — Other Ambulatory Visit (INDEPENDENT_AMBULATORY_CARE_PROVIDER_SITE_OTHER)

## 2023-11-10 DIAGNOSIS — Z79899 Other long term (current) drug therapy: Secondary | ICD-10-CM | POA: Diagnosis not present

## 2023-11-10 DIAGNOSIS — F250 Schizoaffective disorder, bipolar type: Secondary | ICD-10-CM

## 2023-11-10 NOTE — Progress Notes (Signed)
 Pt tolerated lab draws well in right arm with no complaints.   CMA, JNL

## 2023-11-11 LAB — CBC WITH DIFFERENTIAL/PLATELET
Basophils Absolute: 0 10*3/uL (ref 0.0–0.2)
Basos: 1 %
EOS (ABSOLUTE): 0.1 10*3/uL (ref 0.0–0.4)
Eos: 2 %
Hematocrit: 42.6 % (ref 37.5–51.0)
Hemoglobin: 13.9 g/dL (ref 13.0–17.7)
Immature Grans (Abs): 0 10*3/uL (ref 0.0–0.1)
Immature Granulocytes: 0 %
Lymphocytes Absolute: 1 10*3/uL (ref 0.7–3.1)
Lymphs: 22 %
MCH: 29.8 pg (ref 26.6–33.0)
MCHC: 32.6 g/dL (ref 31.5–35.7)
MCV: 91 fL (ref 79–97)
Monocytes Absolute: 0.3 10*3/uL (ref 0.1–0.9)
Monocytes: 7 %
Neutrophils Absolute: 3 10*3/uL (ref 1.4–7.0)
Neutrophils: 68 %
Platelets: 206 10*3/uL (ref 150–450)
RBC: 4.66 x10E6/uL (ref 4.14–5.80)
RDW: 12.9 % (ref 11.6–15.4)
WBC: 4.4 10*3/uL (ref 3.4–10.8)

## 2023-11-11 LAB — LIPID PANEL
Chol/HDL Ratio: 3.4 ratio (ref 0.0–5.0)
Cholesterol, Total: 232 mg/dL — ABNORMAL HIGH (ref 100–199)
HDL: 69 mg/dL (ref >39)
LDL Chol Calc (NIH): 147 mg/dL — ABNORMAL HIGH (ref 0–99)
Triglycerides: 94 mg/dL (ref 0–149)
VLDL Cholesterol Cal: 16 mg/dL (ref 5–40)

## 2023-11-11 LAB — HEMOGLOBIN A1C
Est. average glucose Bld gHb Est-mCnc: 117 mg/dL
Hgb A1c MFr Bld: 5.7 % — ABNORMAL HIGH (ref 4.8–5.6)

## 2023-11-17 ENCOUNTER — Other Ambulatory Visit (HOSPITAL_COMMUNITY): Payer: Self-pay

## 2023-12-05 ENCOUNTER — Other Ambulatory Visit: Payer: Self-pay

## 2023-12-05 ENCOUNTER — Other Ambulatory Visit (HOSPITAL_COMMUNITY): Payer: Self-pay

## 2023-12-15 ENCOUNTER — Other Ambulatory Visit (INDEPENDENT_AMBULATORY_CARE_PROVIDER_SITE_OTHER)

## 2023-12-15 DIAGNOSIS — F411 Generalized anxiety disorder: Secondary | ICD-10-CM | POA: Diagnosis not present

## 2023-12-15 DIAGNOSIS — Z79899 Other long term (current) drug therapy: Secondary | ICD-10-CM | POA: Diagnosis not present

## 2023-12-15 DIAGNOSIS — F250 Schizoaffective disorder, bipolar type: Secondary | ICD-10-CM

## 2023-12-15 NOTE — Progress Notes (Signed)
 Pt tolerated labs well in Right arm with no complaints.   JNL, CMA

## 2023-12-16 LAB — LIPID PANEL
Chol/HDL Ratio: 2.8 ratio (ref 0.0–5.0)
Cholesterol, Total: 243 mg/dL — ABNORMAL HIGH (ref 100–199)
HDL: 87 mg/dL (ref >39)
LDL Chol Calc (NIH): 141 mg/dL — ABNORMAL HIGH (ref 0–99)
Triglycerides: 87 mg/dL (ref 0–149)
VLDL Cholesterol Cal: 15 mg/dL (ref 5–40)

## 2023-12-16 LAB — CBC WITH DIFFERENTIAL/PLATELET
Basophils Absolute: 0 10*3/uL (ref 0.0–0.2)
Basos: 1 %
EOS (ABSOLUTE): 0.1 10*3/uL (ref 0.0–0.4)
Eos: 2 %
Hematocrit: 44.3 % (ref 37.5–51.0)
Hemoglobin: 14.4 g/dL (ref 13.0–17.7)
Immature Grans (Abs): 0 10*3/uL (ref 0.0–0.1)
Immature Granulocytes: 0 %
Lymphocytes Absolute: 1.1 10*3/uL (ref 0.7–3.1)
Lymphs: 25 %
MCH: 30.1 pg (ref 26.6–33.0)
MCHC: 32.5 g/dL (ref 31.5–35.7)
MCV: 93 fL (ref 79–97)
Monocytes Absolute: 0.3 10*3/uL (ref 0.1–0.9)
Monocytes: 7 %
Neutrophils Absolute: 2.8 10*3/uL (ref 1.4–7.0)
Neutrophils: 65 %
Platelets: 208 10*3/uL (ref 150–450)
RBC: 4.78 x10E6/uL (ref 4.14–5.80)
RDW: 13.2 % (ref 11.6–15.4)
WBC: 4.4 10*3/uL (ref 3.4–10.8)

## 2023-12-16 LAB — HEMOGLOBIN A1C
Est. average glucose Bld gHb Est-mCnc: 114 mg/dL
Hgb A1c MFr Bld: 5.6 % (ref 4.8–5.6)

## 2023-12-19 ENCOUNTER — Other Ambulatory Visit (HOSPITAL_COMMUNITY): Payer: Self-pay
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
## 2024-01-05 ENCOUNTER — Other Ambulatory Visit (HOSPITAL_COMMUNITY): Payer: Self-pay

## 2024-01-05 ENCOUNTER — Telehealth (HOSPITAL_COMMUNITY): Admitting: Psychiatry

## 2024-01-05 ENCOUNTER — Encounter (HOSPITAL_COMMUNITY): Payer: Self-pay | Admitting: Psychiatry

## 2024-01-05 DIAGNOSIS — F250 Schizoaffective disorder, bipolar type: Secondary | ICD-10-CM | POA: Diagnosis not present

## 2024-01-05 DIAGNOSIS — F331 Major depressive disorder, recurrent, moderate: Secondary | ICD-10-CM

## 2024-01-05 DIAGNOSIS — F411 Generalized anxiety disorder: Secondary | ICD-10-CM

## 2024-01-05 MED ORDER — VENLAFAXINE HCL ER 150 MG PO CP24
150.0000 mg | ORAL_CAPSULE | Freq: Every day | ORAL | 2 refills | Status: DC
  Filled 2024-01-05: qty 30, 30d supply, fill #0
  Filled 2024-02-03: qty 30, 30d supply, fill #1
  Filled 2024-03-05: qty 30, 30d supply, fill #2

## 2024-01-05 MED ORDER — VENLAFAXINE HCL ER 75 MG PO CP24
75.0000 mg | ORAL_CAPSULE | Freq: Every day | ORAL | 2 refills | Status: DC
  Filled 2024-01-05: qty 30, 30d supply, fill #0
  Filled 2024-02-03: qty 30, 30d supply, fill #1
  Filled 2024-03-05: qty 30, 30d supply, fill #2

## 2024-01-05 MED ORDER — CLOZAPINE 200 MG PO TABS
200.0000 mg | ORAL_TABLET | Freq: Every evening | ORAL | 2 refills | Status: DC
  Filled 2024-01-05 – 2024-01-20 (×2): qty 30, 30d supply, fill #0
  Filled 2024-02-17: qty 30, 30d supply, fill #1

## 2024-01-05 NOTE — Patient Instructions (Signed)
 Thank you for attending your appointment today.  -- INCREASE Effexor  to 225 mg daily -- Continue other medications as prescribed.  Please do not make any changes to medications without first discussing with your provider. If you are experiencing a psychiatric emergency, please call 911 or present to your nearest emergency department. Additional crisis, medication management, and therapy resources are included below.  [facility]  [address] [place], [place] [address] [phone] WALK-IN URGENT CARE 24/7 FOR ANYONE [address] [place], [place]  [phone] Fax: [phone] [email or web address] *Interpreters available *Accepts all insurance and uninsured for Urgent Care needs *Accepts Medicaid and uninsured for outpatient treatment (below)      ONLY FOR [facility]  Below:    Outpatient New Patient Assessment/Therapy Walk-ins:        Monday, Wednesday, and Thursday 8am until slots are full (first come, first served)                   New Patient Psychiatry/Medication Management        Monday-Friday 8am-11am (first come, first served)               For all walk-ins we ask that you arrive by 7:15am, because patients will be seen in the order of arrival.

## 2024-01-06 ENCOUNTER — Other Ambulatory Visit (HOSPITAL_COMMUNITY): Payer: Self-pay

## 2024-01-19 ENCOUNTER — Other Ambulatory Visit (HOSPITAL_COMMUNITY)

## 2024-01-19 DIAGNOSIS — F250 Schizoaffective disorder, bipolar type: Secondary | ICD-10-CM | POA: Diagnosis not present

## 2024-01-19 DIAGNOSIS — Z79899 Other long term (current) drug therapy: Secondary | ICD-10-CM

## 2024-01-19 NOTE — Progress Notes (Signed)
 Pt tolerated labs well in right arm.    JNL

## 2024-01-20 ENCOUNTER — Other Ambulatory Visit: Payer: Self-pay

## 2024-01-20 ENCOUNTER — Other Ambulatory Visit (HOSPITAL_COMMUNITY): Payer: Self-pay

## 2024-01-20 LAB — CBC WITH DIFFERENTIAL/PLATELET
Basophils Absolute: 0 10*3/uL (ref 0.0–0.2)
Basos: 1 %
EOS (ABSOLUTE): 0.1 10*3/uL (ref 0.0–0.4)
Eos: 2 %
Hematocrit: 43.9 % (ref 37.5–51.0)
Hemoglobin: 14.3 g/dL (ref 13.0–17.7)
Immature Grans (Abs): 0 10*3/uL (ref 0.0–0.1)
Immature Granulocytes: 0 %
Lymphocytes Absolute: 1.1 10*3/uL (ref 0.7–3.1)
Lymphs: 22 %
MCH: 30.5 pg (ref 26.6–33.0)
MCHC: 32.6 g/dL (ref 31.5–35.7)
MCV: 94 fL (ref 79–97)
Monocytes Absolute: 0.4 10*3/uL (ref 0.1–0.9)
Monocytes: 7 %
Neutrophils Absolute: 3.4 10*3/uL (ref 1.4–7.0)
Neutrophils: 68 %
Platelets: 212 10*3/uL (ref 150–450)
RBC: 4.69 x10E6/uL (ref 4.14–5.80)
RDW: 13.3 % (ref 11.6–15.4)
WBC: 5 10*3/uL (ref 3.4–10.8)

## 2024-02-03 ENCOUNTER — Other Ambulatory Visit (HOSPITAL_COMMUNITY): Payer: Self-pay

## 2024-02-05 ENCOUNTER — Other Ambulatory Visit (HOSPITAL_COMMUNITY): Payer: Self-pay

## 2024-02-09 ENCOUNTER — Other Ambulatory Visit (INDEPENDENT_AMBULATORY_CARE_PROVIDER_SITE_OTHER)

## 2024-02-09 DIAGNOSIS — F250 Schizoaffective disorder, bipolar type: Secondary | ICD-10-CM

## 2024-02-09 DIAGNOSIS — Z79899 Other long term (current) drug therapy: Secondary | ICD-10-CM

## 2024-02-09 DIAGNOSIS — F411 Generalized anxiety disorder: Secondary | ICD-10-CM | POA: Diagnosis not present

## 2024-02-09 NOTE — Progress Notes (Signed)
 Pt tolerated venipuncture well with no problems,   JNL, CMA

## 2024-02-10 LAB — CBC WITH DIFFERENTIAL/PLATELET
Basophils Absolute: 0 10*3/uL (ref 0.0–0.2)
Basos: 1 %
EOS (ABSOLUTE): 0.1 10*3/uL (ref 0.0–0.4)
Eos: 2 %
Hematocrit: 43.8 % (ref 37.5–51.0)
Hemoglobin: 14.2 g/dL (ref 13.0–17.7)
Immature Grans (Abs): 0 10*3/uL (ref 0.0–0.1)
Immature Granulocytes: 0 %
Lymphocytes Absolute: 1.1 10*3/uL (ref 0.7–3.1)
Lymphs: 21 %
MCH: 30.8 pg (ref 26.6–33.0)
MCHC: 32.4 g/dL (ref 31.5–35.7)
MCV: 95 fL (ref 79–97)
Monocytes Absolute: 0.4 10*3/uL (ref 0.1–0.9)
Monocytes: 7 %
Neutrophils Absolute: 3.4 10*3/uL (ref 1.4–7.0)
Neutrophils: 69 %
Platelets: 194 10*3/uL (ref 150–450)
RBC: 4.61 x10E6/uL (ref 4.14–5.80)
RDW: 13.5 % (ref 11.6–15.4)
WBC: 4.9 10*3/uL (ref 3.4–10.8)

## 2024-02-17 ENCOUNTER — Other Ambulatory Visit: Payer: Self-pay

## 2024-02-17 ENCOUNTER — Other Ambulatory Visit (HOSPITAL_COMMUNITY): Payer: Self-pay

## 2024-02-18 ENCOUNTER — Other Ambulatory Visit (HOSPITAL_COMMUNITY): Payer: Self-pay

## 2024-02-18 ENCOUNTER — Other Ambulatory Visit: Payer: Self-pay

## 2024-02-19 ENCOUNTER — Telehealth (HOSPITAL_COMMUNITY): Payer: Self-pay

## 2024-02-19 ENCOUNTER — Other Ambulatory Visit (HOSPITAL_COMMUNITY): Payer: Self-pay

## 2024-02-19 NOTE — Telephone Encounter (Addendum)
 Hello,   Pt states that his CBC results have not been sent over to his pharmacy in regards to refilling his klonopin, is there any way that you can assist with this because I don't believe we have that capability, but if so I would defiantly need to learn. His CBC has been resulted from his recent Lab app.    Please advise, Thanks    JNL

## 2024-02-19 NOTE — Telephone Encounter (Addendum)
 Hello Spoke to pharmacy,   Pt's insurance is terminated- Medicaid. Not sure how this was done as I tried to call back Pt there was no response from him while trying to gather more details.   HIPAA compliant voice message was left. Maybe Our DSS worker Lolly Riser might have a little more insight, unless patient received or started a new job. Will look more into this issue when pt calls back. As these meds are costing over 70$ for pick up.   JNL

## 2024-03-08 ENCOUNTER — Other Ambulatory Visit (INDEPENDENT_AMBULATORY_CARE_PROVIDER_SITE_OTHER)

## 2024-03-08 ENCOUNTER — Other Ambulatory Visit (HOSPITAL_COMMUNITY): Payer: Self-pay

## 2024-03-08 DIAGNOSIS — Z79899 Other long term (current) drug therapy: Secondary | ICD-10-CM | POA: Diagnosis not present

## 2024-03-08 NOTE — Progress Notes (Signed)
 Patient presented to the office for blood work , pt tolerated labs well with no complaint and will follow up with provider for results
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
## 2024-03-16 ENCOUNTER — Other Ambulatory Visit (HOSPITAL_COMMUNITY): Payer: Self-pay

## 2024-03-16 ENCOUNTER — Encounter (HOSPITAL_COMMUNITY): Payer: Self-pay | Admitting: Psychiatry

## 2024-03-16 ENCOUNTER — Ambulatory Visit (INDEPENDENT_AMBULATORY_CARE_PROVIDER_SITE_OTHER): Admitting: Psychiatry

## 2024-03-16 DIAGNOSIS — F411 Generalized anxiety disorder: Secondary | ICD-10-CM | POA: Diagnosis not present

## 2024-03-16 DIAGNOSIS — F250 Schizoaffective disorder, bipolar type: Secondary | ICD-10-CM | POA: Diagnosis not present

## 2024-03-16 MED ORDER — VENLAFAXINE HCL ER 75 MG PO CP24
75.0000 mg | ORAL_CAPSULE | Freq: Every day | ORAL | 1 refills | Status: DC
  Filled 2024-03-16 – 2024-04-06 (×2): qty 90, 90d supply, fill #0
  Filled 2024-07-02: qty 90, 90d supply, fill #1

## 2024-03-16 MED ORDER — VENLAFAXINE HCL ER 150 MG PO CP24
150.0000 mg | ORAL_CAPSULE | Freq: Every day | ORAL | 1 refills | Status: DC
  Filled 2024-03-16 – 2024-04-07 (×3): qty 90, 90d supply, fill #0
  Filled 2024-07-02: qty 90, 90d supply, fill #1

## 2024-03-16 MED ORDER — CLOZAPINE 200 MG PO TABS
200.0000 mg | ORAL_TABLET | Freq: Every evening | ORAL | 1 refills | Status: DC
  Filled 2024-03-16: qty 90, 90d supply, fill #0
  Filled 2024-06-22: qty 90, 90d supply, fill #1

## 2024-03-16 NOTE — Patient Instructions (Signed)

## 2024-03-17 ENCOUNTER — Other Ambulatory Visit (HOSPITAL_COMMUNITY): Payer: Self-pay

## 2024-03-18 ENCOUNTER — Other Ambulatory Visit (HOSPITAL_COMMUNITY): Payer: Self-pay

## 2024-03-19 ENCOUNTER — Other Ambulatory Visit (HOSPITAL_BASED_OUTPATIENT_CLINIC_OR_DEPARTMENT_OTHER): Payer: Self-pay

## 2024-03-22 ENCOUNTER — Other Ambulatory Visit (HOSPITAL_COMMUNITY): Payer: Self-pay

## 2024-03-24 ENCOUNTER — Other Ambulatory Visit (HOSPITAL_BASED_OUTPATIENT_CLINIC_OR_DEPARTMENT_OTHER): Payer: Self-pay

## 2024-03-31 ENCOUNTER — Other Ambulatory Visit (HOSPITAL_COMMUNITY): Payer: Self-pay

## 2024-04-05 ENCOUNTER — Other Ambulatory Visit (HOSPITAL_COMMUNITY)

## 2024-04-06 ENCOUNTER — Other Ambulatory Visit (HOSPITAL_COMMUNITY): Payer: Self-pay

## 2024-04-07 ENCOUNTER — Other Ambulatory Visit (HOSPITAL_COMMUNITY): Payer: Self-pay

## 2024-04-07 ENCOUNTER — Other Ambulatory Visit: Payer: Self-pay

## 2024-04-14 ENCOUNTER — Ambulatory Visit (INDEPENDENT_AMBULATORY_CARE_PROVIDER_SITE_OTHER): Payer: Medicare Other | Admitting: Urology

## 2024-04-14 ENCOUNTER — Encounter: Payer: Self-pay | Admitting: Urology

## 2024-04-14 VITALS — BP 134/91 | HR 88 | Ht 73.0 in | Wt 185.0 lb

## 2024-04-14 DIAGNOSIS — E291 Testicular hypofunction: Secondary | ICD-10-CM | POA: Diagnosis not present
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
## 2024-04-15 ENCOUNTER — Ambulatory Visit: Payer: Self-pay | Admitting: Urology

## 2024-04-15 DIAGNOSIS — E291 Testicular hypofunction: Secondary | ICD-10-CM

## 2024-04-15 LAB — CBC
Hematocrit: 41.7 % (ref 37.5–51.0)
Hemoglobin: 13.6 g/dL (ref 13.0–17.7)
MCH: 30.6 pg (ref 26.6–33.0)
MCHC: 32.6 g/dL (ref 31.5–35.7)
MCV: 94 fL (ref 79–97)
Platelets: 217 x10E3/uL (ref 150–450)
RBC: 4.44 x10E6/uL (ref 4.14–5.80)
RDW: 13.2 % (ref 11.6–15.4)
WBC: 6.4 x10E3/uL (ref 3.4–10.8)

## 2024-04-15 LAB — TESTOSTERONE: Testosterone: 297 ng/dL (ref 264–916)

## 2024-04-23 ENCOUNTER — Other Ambulatory Visit (HOSPITAL_COMMUNITY): Payer: Self-pay
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
## 2024-05-18 ENCOUNTER — Ambulatory Visit (HOSPITAL_COMMUNITY): Admitting: Psychiatry

## 2024-05-18 ENCOUNTER — Encounter (HOSPITAL_COMMUNITY): Payer: Self-pay | Admitting: Psychiatry

## 2024-05-18 VITALS — BP 135/94 | HR 85 | Ht 73.0 in | Wt 189.4 lb

## 2024-05-18 DIAGNOSIS — F250 Schizoaffective disorder, bipolar type: Secondary | ICD-10-CM

## 2024-05-18 DIAGNOSIS — F411 Generalized anxiety disorder: Secondary | ICD-10-CM

## 2024-05-18 NOTE — Patient Instructions (Addendum)

## 2024-05-27 NOTE — Addendum Note (Signed)
 Addended by: MERCY DOMINO A on: 05/27/2024 02:52 PM   Modules accepted: Level of Service

## 2024-06-14 ENCOUNTER — Other Ambulatory Visit (HOSPITAL_COMMUNITY)

## 2024-06-21 ENCOUNTER — Other Ambulatory Visit (INDEPENDENT_AMBULATORY_CARE_PROVIDER_SITE_OTHER)

## 2024-06-21 DIAGNOSIS — Z79899 Other long term (current) drug therapy: Secondary | ICD-10-CM

## 2024-06-21 NOTE — Progress Notes (Signed)
 Patient presented to office for labs. Patient labs were drawn in patient's right AC. Patient tolerated labs well. No complaints noted. Patient left alert and ambulatory.

## 2024-06-22 ENCOUNTER — Other Ambulatory Visit: Payer: Self-pay

## 2024-06-22 LAB — CBC WITH DIFFERENTIAL/PLATELET
Basophils Absolute: 0 x10E3/uL (ref 0.0–0.2)
Basos: 1 %
EOS (ABSOLUTE): 0.1 x10E3/uL (ref 0.0–0.4)
Eos: 2 %
Hematocrit: 46.4 % (ref 37.5–51.0)
Hemoglobin: 14.8 g/dL (ref 13.0–17.7)
Immature Grans (Abs): 0 x10E3/uL (ref 0.0–0.1)
Immature Granulocytes: 0 %
Lymphocytes Absolute: 1.3 x10E3/uL (ref 0.7–3.1)
Lymphs: 22 %
MCH: 30.4 pg (ref 26.6–33.0)
MCHC: 31.9 g/dL (ref 31.5–35.7)
MCV: 95 fL (ref 79–97)
Monocytes Absolute: 0.4 x10E3/uL (ref 0.1–0.9)
Monocytes: 7 %
Neutrophils Absolute: 3.9 x10E3/uL (ref 1.4–7.0)
Neutrophils: 68 %
Platelets: 224 x10E3/uL (ref 150–450)
RBC: 4.87 x10E6/uL (ref 4.14–5.80)
RDW: 12.9 % (ref 11.6–15.4)
WBC: 5.6 x10E3/uL (ref 3.4–10.8)

## 2024-06-23 ENCOUNTER — Other Ambulatory Visit (HOSPITAL_COMMUNITY): Payer: Self-pay

## 2024-06-23 ENCOUNTER — Telehealth (HOSPITAL_COMMUNITY): Payer: Self-pay

## 2024-06-23 NOTE — Telephone Encounter (Signed)
  Patient called in reporting pharmacy will not release his Clozaril  without approval from provider. Patient recently had blood drawn on 06/21/24.  Please advise

## 2024-06-24 ENCOUNTER — Other Ambulatory Visit (HOSPITAL_COMMUNITY): Payer: Self-pay

## 2024-06-25 ENCOUNTER — Other Ambulatory Visit: Payer: Self-pay

## 2024-06-25 ENCOUNTER — Other Ambulatory Visit (HOSPITAL_COMMUNITY): Payer: Self-pay

## 2024-07-05 ENCOUNTER — Other Ambulatory Visit (HOSPITAL_COMMUNITY): Payer: Self-pay
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
## 2024-07-20 ENCOUNTER — Encounter (HOSPITAL_COMMUNITY): Payer: Self-pay | Admitting: Psychiatry

## 2024-07-20 ENCOUNTER — Ambulatory Visit (INDEPENDENT_AMBULATORY_CARE_PROVIDER_SITE_OTHER): Admitting: Psychiatry

## 2024-07-20 ENCOUNTER — Other Ambulatory Visit (HOSPITAL_COMMUNITY): Payer: Self-pay

## 2024-07-20 VITALS — BP 136/86 | HR 96 | Resp 15 | Ht 73.0 in | Wt 190.6 lb

## 2024-07-20 DIAGNOSIS — F250 Schizoaffective disorder, bipolar type: Secondary | ICD-10-CM | POA: Diagnosis not present

## 2024-07-20 DIAGNOSIS — F411 Generalized anxiety disorder: Secondary | ICD-10-CM

## 2024-07-20 MED ORDER — VENLAFAXINE HCL ER 150 MG PO CP24
150.0000 mg | ORAL_CAPSULE | Freq: Every day | ORAL | 1 refills | Status: AC
  Filled 2024-07-20 – 2024-09-29 (×2): qty 90, 90d supply, fill #0

## 2024-07-20 MED ORDER — CLOZAPINE 200 MG PO TABS
200.0000 mg | ORAL_TABLET | Freq: Every evening | ORAL | 1 refills | Status: AC
  Filled 2024-07-20 – 2024-09-16 (×2): qty 90, 90d supply, fill #0

## 2024-07-20 MED ORDER — VENLAFAXINE HCL ER 75 MG PO CP24
75.0000 mg | ORAL_CAPSULE | Freq: Every day | ORAL | 1 refills | Status: AC
  Filled 2024-07-20 – 2024-09-29 (×2): qty 90, 90d supply, fill #0

## 2024-07-20 NOTE — Patient Instructions (Signed)

## 2024-07-20 NOTE — Addendum Note (Signed)
 Addended by: MERCY DOMINO A on: 07/20/2024 02:41 PM   Modules accepted: Level of Service

## 2024-07-21 ENCOUNTER — Other Ambulatory Visit (HOSPITAL_COMMUNITY): Payer: Self-pay

## 2024-08-23 ENCOUNTER — Other Ambulatory Visit (INDEPENDENT_AMBULATORY_CARE_PROVIDER_SITE_OTHER)

## 2024-08-23 DIAGNOSIS — F250 Schizoaffective disorder, bipolar type: Secondary | ICD-10-CM | POA: Diagnosis not present

## 2024-08-23 DIAGNOSIS — Z79899 Other long term (current) drug therapy: Secondary | ICD-10-CM

## 2024-08-23 NOTE — Addendum Note (Signed)
 Addended by: TRUDY MANUEL A on: 08/23/2024 01:17 PM   Modules accepted: Orders

## 2024-08-23 NOTE — Progress Notes (Signed)
 Patient presented to the office for labs, labs were drawn from right [facility] with no issue or complaints . Pt left office alert and ambulatory.

## 2024-08-24 LAB — CBC WITH DIFFERENTIAL/PLATELET
Basophils Absolute: 0 x10E3/uL (ref 0.0–0.2)
Basos: 1 %
EOS (ABSOLUTE): 0.1 x10E3/uL (ref 0.0–0.4)
Eos: 2 %
Hematocrit: 44.1 % (ref 37.5–51.0)
Hemoglobin: 14.2 g/dL (ref 13.0–17.7)
Immature Grans (Abs): 0 x10E3/uL (ref 0.0–0.1)
Immature Granulocytes: 0 %
Lymphocytes Absolute: 1.3 x10E3/uL (ref 0.7–3.1)
Lymphs: 28 %
MCH: 30.5 pg (ref 26.6–33.0)
MCHC: 32.2 g/dL (ref 31.5–35.7)
MCV: 95 fL (ref 79–97)
Monocytes Absolute: 0.4 x10E3/uL (ref 0.1–0.9)
Monocytes: 9 %
Neutrophils Absolute: 2.9 x10E3/uL (ref 1.4–7.0)
Neutrophils: 60 %
Platelets: 180 x10E3/uL (ref 150–450)
RBC: 4.66 x10E6/uL (ref 4.14–5.80)
RDW: 13.5 % (ref 11.6–15.4)
WBC: 4.8 x10E3/uL (ref 3.4–10.8)

## 2024-09-16 ENCOUNTER — Other Ambulatory Visit (HOSPITAL_COMMUNITY): Payer: Self-pay

## 2024-09-17 ENCOUNTER — Other Ambulatory Visit (HOSPITAL_COMMUNITY): Payer: Self-pay

## 2024-09-29 ENCOUNTER — Other Ambulatory Visit (HOSPITAL_COMMUNITY): Payer: Self-pay

## 2024-10-13 ENCOUNTER — Ambulatory Visit: Admitting: Urology

## 2024-10-20 ENCOUNTER — Encounter (HOSPITAL_COMMUNITY)

## 2024-10-21 ENCOUNTER — Encounter (HOSPITAL_COMMUNITY): Payer: Self-pay

## 2024-11-18 ENCOUNTER — Ambulatory Visit: Admitting: Urology
